# Patient Record
Sex: Female | Born: 1985 | Race: White | Hispanic: No | Smoking: Never smoker
Health system: Southern US, Community
[De-identification: ages and names within clinical notes are randomized; demographics above are authoritative.]

## PROBLEM LIST (undated history)

## (undated) DIAGNOSIS — D1800 Hemangioma unspecified site: Secondary | ICD-10-CM

## (undated) DIAGNOSIS — F419 Anxiety disorder, unspecified: Secondary | ICD-10-CM

## (undated) DIAGNOSIS — T7840XA Allergy, unspecified, initial encounter: Secondary | ICD-10-CM

## (undated) DIAGNOSIS — G43909 Migraine, unspecified, not intractable, without status migrainosus: Secondary | ICD-10-CM

## (undated) HISTORY — DX: Allergy, unspecified, initial encounter: T78.40XA

## (undated) HISTORY — DX: Anxiety disorder, unspecified: F41.9

## (undated) HISTORY — PX: BACK SURGERY: SHX140

---
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
## 2015-07-21 DIAGNOSIS — H52223 Regular astigmatism, bilateral: Secondary | ICD-10-CM | POA: Diagnosis not present

## 2015-07-21 DIAGNOSIS — H5201 Hypermetropia, right eye: Secondary | ICD-10-CM | POA: Diagnosis not present

## 2016-07-03 DIAGNOSIS — Z1389 Encounter for screening for other disorder: Secondary | ICD-10-CM | POA: Diagnosis not present

## 2016-07-03 DIAGNOSIS — Z0000 Encounter for general adult medical examination without abnormal findings: Secondary | ICD-10-CM | POA: Diagnosis not present

## 2016-07-04 DIAGNOSIS — Z6836 Body mass index (BMI) 36.0-36.9, adult: Secondary | ICD-10-CM | POA: Diagnosis not present

## 2016-07-04 DIAGNOSIS — Z01419 Encounter for gynecological examination (general) (routine) without abnormal findings: Secondary | ICD-10-CM | POA: Diagnosis not present

## 2016-07-04 DIAGNOSIS — N946 Dysmenorrhea, unspecified: Secondary | ICD-10-CM | POA: Diagnosis not present

## 2016-07-04 DIAGNOSIS — Z113 Encounter for screening for infections with a predominantly sexual mode of transmission: Secondary | ICD-10-CM | POA: Diagnosis not present

## 2016-07-04 DIAGNOSIS — Z131 Encounter for screening for diabetes mellitus: Secondary | ICD-10-CM | POA: Diagnosis not present

## 2016-07-04 MED FILL — ELINEST-28 TABLET: 0.3-30 | 28 days supply | Qty: 28 | Fill #0

## 2016-07-04 MED FILL — PHENTERMINE 37.5 MG TABLET: 37.5 | 30 days supply | Qty: 15 | Fill #0

## 2016-07-04 MED FILL — IBUPROFEN 800 MG TABLET: 800 | 10 days supply | Qty: 30 | Fill #0

## 2016-08-03 DIAGNOSIS — Z6835 Body mass index (BMI) 35.0-35.9, adult: Secondary | ICD-10-CM | POA: Diagnosis not present

## 2016-08-03 DIAGNOSIS — E669 Obesity, unspecified: Secondary | ICD-10-CM | POA: Diagnosis not present

## 2016-08-03 MED FILL — PHENTERMINE 37.5 MG TABLET: 37.5 | 30 days supply | Qty: 30 | Fill #0

## 2016-08-06 MED FILL — ELINEST-28 TABLET: 0.3-30 | 28 days supply | Qty: 28 | Fill #1

## 2016-08-28 DIAGNOSIS — Z6834 Body mass index (BMI) 34.0-34.9, adult: Secondary | ICD-10-CM | POA: Diagnosis not present

## 2016-08-28 DIAGNOSIS — E669 Obesity, unspecified: Secondary | ICD-10-CM | POA: Diagnosis not present

## 2016-09-13 MED FILL — PHENTERMINE 37.5 MG TABLET: 37.5 | 30 days supply | Qty: 30 | Fill #0

## 2017-01-28 ENCOUNTER — Ambulatory Visit (HOSPITAL_COMMUNITY)
Admission: EM | Admit: 2017-01-28 | Discharge: 2017-01-28 | Disposition: A | Discharge status: Home / Self Care | Payer: 59 | Attending: Family Medicine | Admitting: Family Medicine

## 2017-01-28 ENCOUNTER — Encounter (HOSPITAL_COMMUNITY): Payer: Self-pay | Admitting: Emergency Medicine

## 2017-01-28 DIAGNOSIS — H9202 Otalgia, left ear: Secondary | ICD-10-CM | POA: Diagnosis not present

## 2017-01-28 DIAGNOSIS — R5383 Other fatigue: Secondary | ICD-10-CM

## 2017-01-28 DIAGNOSIS — B349 Viral infection, unspecified: Secondary | ICD-10-CM | POA: Diagnosis not present

## 2017-01-28 DIAGNOSIS — J029 Acute pharyngitis, unspecified: Secondary | ICD-10-CM

## 2017-01-28 HISTORY — DX: Hemangioma unspecified site: D18.00

## 2017-01-28 LAB — POCT RAPID STREP A: STREPTOCOCCUS, GROUP A SCREEN (DIRECT): NEGATIVE

## 2017-01-28 LAB — POCT INFECTIOUS MONO SCREEN: MONO SCREEN: NEGATIVE

## 2017-01-28 MED ORDER — PREDNISONE 10 MG (21) PO TBPK: ORAL_TABLET | ORAL | 0 refills | Status: DC

## 2017-01-28 MED ORDER — MAGIC MOUTHWASH W/LIDOCAINE: 5.0000 mL | Freq: Three times a day (TID) | ORAL | 0 refills | Status: DC | PRN

## 2017-01-28 MED FILL — predniSONE 10 MG TABS: 10 | 6 days supply | Qty: 21 | Fill #0

## 2017-01-28 NOTE — Discharge Instructions (Signed)
Strep test and mono test were both negative. Your symptoms are not consistent with strep pharyngitis but are consistent with several types of viral respiratory illnesses. Your symptoms should start to resolve over the next few days, I prescribed Magic mouthwash with lidocaine for pain and discomfort, swish and swallow 3 times a day, and a short course of prednisone to reduce swelling and inflammation. If symptoms persist past one week, follow-up with your regular doctor or return to clinic as needed.

## 2017-01-28 NOTE — ED Triage Notes (Signed)
2 weeks ago woke with sore throat, used otc meds, started to feel better.  Continues to have a little pain with swallowing, lymph nodes swollen, feeling drained, lethargic.

## 2017-01-28 NOTE — ED Provider Notes (Signed)
  [facility]   [id number] 01/28/17 Arrival Time: 1014   SUBJECTIVE:  Mikayla Hicks is a 31 y.o. female who presents to the urgent care  with complaint of sore throat, swelling, and painful swallowing for 2 weeks. Symptoms started out with congestion, cough, and she had "scratchy voice" she has had fatigue, and loss of appetite as well. She's had no nausea or vomiting, does have left ear otalgia. 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   OBJECTIVE:  Vitals:   01/28/17 1106  BP: 130/70  Pulse: 97  Resp: 18  Temp: 98 F (36.7 C)  TempSrc: Oral  SpO2: 100%     General appearance: alert; no distress HEENT: normocephalic; atraumatic; conjunctivae normal; Tympanic membranes pearly grey without erythema or bulging, oropharynx without erythema or edema, tonsils +3 without exudate, no sinus tenderness, tender tonsillar lymph nodes Neck: Trachea midline, no JVD, there is cervical lymphadenopathy Lungs: clear to auscultation bilaterally Heart: regular rate and rhythm Abdomen: soft, non-tender; bowel sounds normal; no masses or organomegaly; no guarding or rebound tenderness Musculoskeletal/extremities: Pulses +2, grossly symmetrical, no dependent edema  Skin: warm and dry Neurologic: Grossly normal Psychological:  alert and cooperative; normal mood and affect     ASSESSMENT & PLAN:  1. Viral pharyngitis     Meds ordered this encounter  Medications  . cetirizine (ZYRTEC) 10 MG chewable tablet    Sig: Chew 10 mg by mouth daily.  . magic mouthwash w/lidocaine SOLN    Sig: Take 5 mLs by mouth 3 (three) times daily as needed for mouth pain.    Dispense:  100 mL    Refill:  0    1 Part Lidocaine, 1 Part Nystatin, 1 Part Maalox, 1 Part Benadryl    Order Specific Question:   Supervising Provider    Answer:   Mikayla Hicks [[id number]]  . predniSONE (STERAPRED UNI-PAK 21 TAB) 10 MG (21) TBPK tablet    Sig: Take 6  tablets tomorrow, decrease by 1 each day till finished (6,5,4,3,2,1)    Dispense:  21 tablet    Refill:  0    Order Specific Question:   Supervising Provider    Answer:   Mikayla Hicks [[id number]]    Strep and mono tests are negative. Believe most likely viral URI, given Magic mouthwash for pain, prednisone for swelling and inflammation. Follow-up with primary care or return to clinic as needed.  Reviewed expectations re: course of current medical issues. Questions answered. Outlined signs and symptoms indicating need for more acute intervention. Patient verbalized understanding. After Visit Summary given.    Procedures:     Results for orders placed or performed during the hospital encounter of 01/28/17  POCT rapid strep A [facility])  Result Value Ref Range   Streptococcus, Group A Screen (Direct) NEGATIVE NEGATIVE  Infectious mono screen, POC  Result Value Ref Range   Mono Screen NEGATIVE NEGATIVE    Labs Reviewed  POCT RAPID STREP A  POCT INFECTIOUS MONO SCREEN    No results found.  No Known Allergies  PMHx, SurgHx, SocialHx, Medications, and Allergies were reviewed in the Visit Navigator and updated as appropriate.       Mikayla Glasgow, NP 01/28/17 1225

## 2017-01-31 LAB — CULTURE, GROUP A STREP (THRC)

## 2017-02-12 MED FILL — PHENTERMINE 37.5 MG TABLET: 37.5 | 30 days supply | Qty: 30 | Fill #1

## 2017-06-19 MED FILL — AZITHROMYCIN 250 MG TABLET: 250 | 5 days supply | Qty: 6 | Fill #0

## 2017-12-08 ENCOUNTER — Ambulatory Visit (INDEPENDENT_AMBULATORY_CARE_PROVIDER_SITE_OTHER): Payer: Self-pay | Admitting: Family Medicine

## 2017-12-08 VITALS — BP 118/82 | HR 77 | Temp 98.4°F | Resp 98 | Ht 71.0 in | Wt 235.0 lb

## 2017-12-08 DIAGNOSIS — Z0000 Encounter for general adult medical examination without abnormal findings: Secondary | ICD-10-CM

## 2017-12-08 NOTE — Patient Instructions (Signed)

## 2017-12-08 NOTE — Progress Notes (Signed)
Mikayla Hicks is a 32 y.o. female who presents today with concerns of an employment physical exam. 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  Review of Systems  Constitutional: Negative for chills, fever and malaise/fatigue.  HENT: Negative for congestion, ear discharge, ear pain, sinus pain and sore throat.   Eyes: Negative.   Respiratory: Negative for cough, sputum production and shortness of breath.   Cardiovascular: Negative.  Negative for chest pain.  Gastrointestinal: Negative for abdominal pain, diarrhea, nausea and vomiting.  Genitourinary: Negative for dysuria, frequency, hematuria and urgency.  Musculoskeletal: Negative for myalgias.  Skin: Negative.   Neurological: Negative for headaches.  Endo/Heme/Allergies: Negative.   Psychiatric/Behavioral: Negative.     O: Vitals:   12/08/17 1105  BP: 118/82  Pulse: 77  Resp: (!) 98  Temp: 98.4 F (36.9 C)     Physical Exam  Constitutional: She is oriented to person, place, and time. Vital signs are normal. She appears well-developed and well-nourished. She is active.  Non-toxic appearance. She does not have a sickly appearance.  HENT:  Head: Normocephalic.  Right Ear: Hearing, tympanic membrane, external ear and ear canal normal.  Left Ear: Hearing, tympanic membrane, external ear and ear canal normal.  Nose: Nose normal.  Mouth/Throat: Uvula is midline and oropharynx is clear and moist.  Neck: Normal range of motion. Neck supple.  Cardiovascular: Normal rate, regular rhythm, normal heart sounds and normal pulses.  Pulmonary/Chest: Effort normal and breath sounds normal.  Abdominal: Soft. Bowel sounds are normal.  Musculoskeletal: Normal range of motion.  Lymphadenopathy:       Head (right side): No submental and no submandibular adenopathy present.       Head (left side): No submental and no  submandibular adenopathy present.    She has no cervical adenopathy.  Neurological: She is alert and oriented to person, place, and time.  Psychiatric: She has a normal mood and affect. Her speech is normal and behavior is normal. Cognition and memory are normal.  PHQ-9- negative  Vitals reviewed.  A: 1. Physical exam    P: Exam findings, diagnosis etiology and medication use and indications reviewed with patient. Follow- Up and discharge instructions provided. No emergent/urgent issues found on exam.  Patient verbalized understanding of information provided and agrees with plan of care (POC), all questions answered.  1. Physical exam  WNL

## 2018-02-12 MED FILL — METHYLPREDNISOLONE 4 MG TAB: 4 | 6 days supply | Qty: 21 | Fill #0

## 2018-02-26 ENCOUNTER — Encounter: Payer: Self-pay | Admitting: Family Medicine

## 2018-02-26 ENCOUNTER — Ambulatory Visit (INDEPENDENT_AMBULATORY_CARE_PROVIDER_SITE_OTHER): Payer: Self-pay | Admitting: Family Medicine

## 2018-02-26 VITALS — BP 110/82 | HR 76 | Temp 98.3°F | Wt 235.4 lb

## 2018-02-26 DIAGNOSIS — L309 Dermatitis, unspecified: Secondary | ICD-10-CM

## 2018-02-26 MED ORDER — ERYTHROMYCIN 5 MG/GM OP OINT: 1.0000 "application " | TOPICAL_OINTMENT | Freq: Two times a day (BID) | OPHTHALMIC | 0 refills | Status: AC

## 2018-02-26 MED ORDER — PREDNISONE 20 MG PO TABS: 40.0000 mg | ORAL_TABLET | Freq: Every day | ORAL | 0 refills | Status: AC

## 2018-02-26 MED FILL — ERYTHROMYCIN EYE OINTMENT: 5 | 5 days supply | Qty: 4 | Fill #0

## 2018-02-26 MED FILL — predniSONE 20 MG TABS: 20 | 5 days supply | Qty: 10 | Fill #0

## 2018-02-26 NOTE — Progress Notes (Signed)
Mikayla Hicks is a 32 y.o. female who presents today with concerns of left eye skin irritation. She reports that this condition has been present over the last month. She has had treatment of this condition with topical and oral steroids with minimal relief of symptoms. She denies any visual symptoms like blurred vision or decreased acuity. She does report mild pain and itching limited to the skin on the upper and lower lid. She confirms some historical report of seasonal allergies.  Review of Systems  Constitutional: Negative for chills, fever and malaise/fatigue.  HENT: Negative for congestion, ear discharge, ear pain, sinus pain and sore throat.   Eyes: Positive for pain and redness.       Dry skin, itching and redness on left eye x 1 month +  Respiratory: Negative for cough, sputum production and shortness of breath.   Cardiovascular: Negative.  Negative for chest pain.  Gastrointestinal: Negative for abdominal pain, diarrhea, nausea and vomiting.  Genitourinary: Negative for dysuria, frequency, hematuria and urgency.  Musculoskeletal: Negative for myalgias.  Skin: Negative.   Neurological: Negative for headaches.  Endo/Heme/Allergies: Negative.   Psychiatric/Behavioral: Negative.     O: Vitals:   02/26/18 0817  BP: 110/82  Pulse: 76  Temp: 98.3 F (36.8 C)  SpO2: 99%     Physical Exam  Constitutional: She is oriented to person, place, and time. Vital signs are normal. She appears well-developed and well-nourished. She is active.  Non-toxic appearance. She does not have a sickly appearance.  HENT:  Head: Normocephalic.  Right Ear: Hearing, tympanic membrane, external ear and ear canal normal.  Left Ear: Hearing, tympanic membrane, external ear and ear canal normal.  Nose: Nose normal.  Mouth/Throat: Uvula is midline and oropharynx is clear and moist.  Eyes: Pupils are equal, round, and reactive to light. EOM are normal. Right eye exhibits no chemosis, no discharge, no  exudate and no hordeolum. No foreign body present in the right eye. Left eye exhibits no chemosis, no discharge, no exudate and no hordeolum. No foreign body present in the left eye. Right conjunctiva is not injected. Right conjunctiva has no hemorrhage. Left conjunctiva is not injected. Left conjunctiva has no hemorrhage. No scleral icterus.    Denies pain to globe area when palpated mild skin erythema and dermatitis like skin flaking noted - no evidence of edema or effusion-   Neck: Normal range of motion. Neck supple.  Cardiovascular: Normal rate, regular rhythm, normal heart sounds and normal pulses.  Pulmonary/Chest: Effort normal and breath sounds normal.  Abdominal: Soft. Bowel sounds are normal.  Musculoskeletal: Normal range of motion.  Lymphadenopathy:       Head (right side): No submental and no submandibular adenopathy present.       Head (left side): No submental and no submandibular adenopathy present.    She has no cervical adenopathy.  Neurological: She is alert and oriented to person, place, and time.  Psychiatric: She has a normal mood and affect.  Vitals reviewed.  A: 1. Dermatitis    P: Discussed exam findings, diagnosis etiology and medication use and indications reviewed with patient. Follow- Up and discharge instructions provided. No emergent/urgent issues found on exam.  Patient verbalized understanding of information provided and agrees with plan of care (POC), all questions answered.  1. Dermatitis - predniSONE (DELTASONE) 20 MG tablet; Take 2 tablets (40 mg total) by mouth daily with breakfast for 5 days. - erythromycin ophthalmic ointment; Place 1 application into the left eye 2 (two) times  daily for 5 days.  Advised if affected area unimproved to seek care with PCP for evaluation and potential referral.

## 2018-02-26 NOTE — Patient Instructions (Signed)

## 2018-12-10 ENCOUNTER — Ambulatory Visit (INDEPENDENT_AMBULATORY_CARE_PROVIDER_SITE_OTHER): Payer: Self-pay | Admitting: Nurse Practitioner

## 2018-12-10 ENCOUNTER — Other Ambulatory Visit: Payer: Self-pay

## 2018-12-10 VITALS — BP 110/70 | HR 80 | Temp 98.2°F | Resp 16 | Ht 70.5 in | Wt 233.2 lb

## 2018-12-10 DIAGNOSIS — K649 Unspecified hemorrhoids: Secondary | ICD-10-CM

## 2018-12-10 DIAGNOSIS — Z0289 Encounter for other administrative examinations: Secondary | ICD-10-CM

## 2018-12-10 MED ORDER — HYDROCORTISONE ACETATE 25 MG RE SUPP: 25.0000 mg | Freq: Two times a day (BID) | RECTAL | 0 refills | Status: AC | PRN

## 2018-12-10 MED FILL — HYDROCORTISONE ACETATE 25 M: 25 | 12 days supply | Qty: 24 | Fill #0

## 2018-12-10 NOTE — Patient Instructions (Addendum)
Health Maintenance, Female Adopting a healthy lifestyle and getting preventive care are important in promoting health and wellness. Ask your health care provider about:  The right schedule for you to have regular tests and exams.  Things you can do on your own to prevent diseases and keep yourself healthy. What should I know about diet, weight, and exercise? Eat a healthy diet   Eat a diet that includes plenty of vegetables, fruits, low-fat dairy products, and lean protein.  Do not eat a lot of foods that are high in solid fats, added sugars, or sodium. Maintain a healthy weight Body mass index (BMI) is used to identify weight problems. It estimates body fat based on height and weight. Your health care provider can help determine your BMI and help you achieve or maintain a healthy weight. Get regular exercise Get regular exercise. This is one of the most important things you can do for your health. Most adults should:  Exercise for at least 150 minutes each week. The exercise should increase your heart rate and make you sweat (moderate-intensity exercise).  Do strengthening exercises at least twice a week. This is in addition to the moderate-intensity exercise.  Spend less time sitting. Even light physical activity can be beneficial. Watch cholesterol and blood lipids Have your blood tested for lipids and cholesterol at 31 years of age, then have this test every 5 years. Have your cholesterol levels checked more often if:  Your lipid or cholesterol levels are high.  You are older than 31 years of age.  You are at high risk for heart disease. What should I know about cancer screening? Depending on your health history and family history, you may need to have cancer screening at various ages. This may include screening for:  Breast cancer.  Cervical cancer.  Colorectal cancer.  Skin cancer.  Lung cancer. What should I know about heart disease, diabetes, and high blood  pressure? Blood pressure and heart disease  High blood pressure causes heart disease and increases the risk of stroke. This is more likely to develop in people who have high blood pressure readings, are of African descent, or are overweight.  Have your blood pressure checked: ? Every 3-5 years if you are 68-56 years of age. ? Every year if you are 44 years old or older. Diabetes Have regular diabetes screenings. This checks your fasting blood sugar level. Have the screening done:  Once every three years after age 35 if you are at a normal weight and have a low risk for diabetes.  More often and at a younger age if you are overweight or have a high risk for diabetes. What should I know about preventing infection? Hepatitis B If you have a higher risk for hepatitis B, you should be screened for this virus. Talk with your health care provider to find out if you are at risk for hepatitis B infection. Hepatitis C Testing is recommended for:  Everyone born from [birth year] through 1963.  Anyone with known risk factors for hepatitis C. Sexually transmitted infections (STIs)  Get screened for STIs, including gonorrhea and chlamydia, if: ? You are sexually active and are younger than 31 years of age. ? You are older than 31 years of age and your health care provider tells you that you are at risk for this type of infection. ? Your sexual activity has changed since you were last screened, and you are at increased risk for chlamydia or gonorrhea. Ask your health care provider if  you are at risk.  Ask your health care provider about whether you are at high risk for HIV. Your health care provider may recommend a prescription medicine to help prevent HIV infection. If you choose to take medicine to prevent HIV, you should first get tested for HIV. You should then be tested every 3 months for as long as you are taking the medicine. Pregnancy  If you are about to stop having your period (premenopausal) and  you may become pregnant, seek counseling before you get pregnant.  Take 400 to 800 micrograms (mcg) of folic acid every day if you become pregnant.  Ask for birth control (contraception) if you want to prevent pregnancy. Osteoporosis and menopause Osteoporosis is a disease in which the bones lose minerals and strength with aging. This can result in bone fractures. If you are 14 years old or older, or if you are at risk for osteoporosis and fractures, ask your health care provider if you should:  Be screened for bone loss.  Take a calcium or vitamin D supplement to lower your risk of fractures.  Be given hormone replacement therapy (HRT) to treat symptoms of menopause. Follow these instructions at home: Lifestyle  Do not use any products that contain nicotine or tobacco, such as cigarettes, e-cigarettes, and chewing tobacco. If you need help quitting, ask your health care provider.  Do not use street drugs.  Do not share needles.  Ask your health care provider for help if you need support or information about quitting drugs. Alcohol use  Do not drink alcohol if: ? Your health care provider tells you not to drink. ? You are pregnant, may be pregnant, or are planning to become pregnant.  If you drink alcohol: ? Limit how much you use to 0-1 drink a day. ? Limit intake if you are breastfeeding.  Be aware of how much alcohol is in your drink. In the [place] one drink equals one 12 oz bottle of beer (355 mL), one 5 oz glass of wine (148 mL), or one 1 oz glass of hard liquor (44 mL). General instructions  Schedule regular health, dental, and eye exams.  Stay current with your vaccines.  Tell your health care provider if: ? You often feel depressed. ? You have ever been abused or do not feel safe at home. Summary  Adopting a healthy lifestyle and getting preventive care are important in promoting health and wellness.  Follow your health care provider's instructions about healthy  diet, exercising, and getting tested or screened for diseases.  Follow your health care provider's instructions on monitoring your cholesterol and blood pressure. This information is not intended to replace advice given to you by your health care provider. Make sure you discuss any questions you have with your health care provider. Document Released: 12/11/2010 Document Revised: 05/21/2018 Document Reviewed: 05/21/2018 Elsevier Patient Education  2020 [employer] 39-36 Years Old, Female Preventive care refers to visits with your health care provider and lifestyle choices that can promote health and wellness. This includes:  A yearly physical exam. This may also be called an annual well check.  Regular dental visits and eye exams.  Immunizations.  Screening for certain conditions.  Healthy lifestyle choices, such as eating a healthy diet, getting regular exercise, not using drugs or products that contain nicotine and tobacco, and limiting alcohol use. What can I expect for my preventive care visit? Physical exam Your health care provider will check your:  Height and weight. This  may be used to calculate body mass index (BMI), which tells if you are at a healthy weight.  Heart rate and blood pressure.  Skin for abnormal spots. Counseling Your health care provider may ask you questions about your:  Alcohol, tobacco, and drug use.  Emotional well-being.  Home and relationship well-being.  Sexual activity.  Eating habits.  Work and work Statistician.  Method of birth control.  Menstrual cycle.  Pregnancy history. What immunizations do I need?  Influenza (flu) vaccine  This is recommended every year. Tetanus, diphtheria, and pertussis (Tdap) vaccine  You may need a Td booster every 10 years. Varicella (chickenpox) vaccine  You may need this if you have not been vaccinated. Human papillomavirus (HPV) vaccine  If recommended by your health care  provider, you may need three doses over 6 months. Measles, mumps, and rubella (MMR) vaccine  You may need at least one dose of MMR. You may also need a second dose. Meningococcal conjugate (MenACWY) vaccine  One dose is recommended if you are age 3-21 years and a first-year college student living in a residence hall, or if you have one of several medical conditions. You may also need additional booster doses. Pneumococcal conjugate (PCV13) vaccine  You may need this if you have certain conditions and were not previously vaccinated. Pneumococcal polysaccharide (PPSV23) vaccine  You may need one or two doses if you smoke cigarettes or if you have certain conditions. Hepatitis A vaccine  You may need this if you have certain conditions or if you travel or work in places where you may be exposed to hepatitis A. Hepatitis B vaccine  You may need this if you have certain conditions or if you travel or work in places where you may be exposed to hepatitis B. Haemophilus influenzae type b (Hib) vaccine  You may need this if you have certain conditions. You may receive vaccines as individual doses or as more than one vaccine together in one shot (combination vaccines). Talk with your health care provider about the risks and benefits of combination vaccines. What tests do I need?  Blood tests  Lipid and cholesterol levels. These may be checked every 5 years starting at age 64.  Hepatitis C test.  Hepatitis B test. Screening  Diabetes screening. This is done by checking your blood sugar (glucose) after you have not eaten for a while (fasting).  Sexually transmitted disease (STD) testing.  BRCA-related cancer screening. This may be done if you have a family history of breast, ovarian, tubal, or peritoneal cancers.  Pelvic exam and Pap test. This may be done every 3 years starting at age 64. Starting at age 53, this may be done every 5 years if you have a Pap test in combination with an  HPV test. Talk with your health care provider about your test results, treatment options, and if necessary, the need for more tests. Follow these instructions at home: Eating and drinking   Eat a diet that includes fresh fruits and vegetables, whole grains, lean protein, and low-fat dairy.  Take vitamin and mineral supplements as recommended by your health care provider.  Do not drink alcohol if: ? Your health care provider tells you not to drink. ? You are pregnant, may be pregnant, or are planning to become pregnant.  If you drink alcohol: ? Limit how much you have to 0-1 drink a day. ? Be aware of how much alcohol is in your drink. In the [place] one drink equals one 12 oz  bottle of beer (355 mL), one 5 oz glass of wine (148 mL), or one 1 oz glass of hard liquor (44 mL). Lifestyle  Take daily care of your teeth and gums.  Stay active. Exercise for at least 30 minutes on 5 or more days each week.  Do not use any products that contain nicotine or tobacco, such as cigarettes, e-cigarettes, and chewing tobacco. If you need help quitting, ask your health care provider.  If you are sexually active, practice safe sex. Use a condom or other form of birth control (contraception) in order to prevent pregnancy and STIs (sexually transmitted infections). If you plan to become pregnant, see your health care provider for a preconception visit. What's next?  Visit your health care provider once a year for a well check visit.  Ask your health care provider how often you should have your eyes and teeth checked.  Stay up to date on all vaccines. This information is not intended to replace advice given to you by your health care provider. Make sure you discuss any questions you have with your health care provider. Document Released: 07/24/2001 Document Revised: 02/06/2018 Document Reviewed: 02/06/2018 Elsevier Patient Education  2020 [employer] Following a healthy eating  pattern may help you to achieve and maintain a healthy body weight, reduce the risk of chronic disease, and live a long and productive life. It is important to follow a healthy eating pattern at an appropriate calorie level for your body. Your nutritional needs should be met primarily through food by choosing a variety of nutrient-rich foods. What are tips for following this plan? Reading food labels  Read labels and choose the following: ? Reduced or low sodium. ? Juices with 100% fruit juice. ? Foods with low saturated fats and high polyunsaturated and monounsaturated fats. ? Foods with whole grains, such as whole wheat, cracked wheat, brown rice, and wild rice. ? Whole grains that are fortified with folic acid. This is recommended for women who are pregnant or who want to become pregnant.  Read labels and avoid the following: ? Foods with a lot of added sugars. These include foods that contain brown sugar, corn sweetener, corn syrup, dextrose, fructose, glucose, high-fructose corn syrup, honey, invert sugar, lactose, malt syrup, maltose, molasses, raw sugar, sucrose, trehalose, or turbinado sugar.  Do not eat more than the following amounts of added sugar per day:  6 teaspoons (25 g) for women.  9 teaspoons (38 g) for men. ? Foods that contain processed or refined starches and grains. ? Refined grain products, such as white flour, degermed cornmeal, white bread, and white rice. Shopping  Choose nutrient-rich snacks, such as vegetables, whole fruits, and nuts. Avoid high-calorie and high-sugar snacks, such as potato chips, fruit snacks, and candy.  Use oil-based dressings and spreads on foods instead of solid fats such as butter, stick margarine, or cream cheese.  Limit pre-made sauces, mixes, and "instant" products such as flavored rice, instant noodles, and ready-made pasta.  Try more plant-protein sources, such as tofu, tempeh, black beans, edamame, lentils, nuts, and  seeds.  Explore eating plans such as the Mediterranean diet or vegetarian diet. Cooking  Use oil to saut or stir-fry foods instead of solid fats such as butter, stick margarine, or lard.  Try baking, boiling, grilling, or broiling instead of frying.  Remove the fatty part of meats before cooking.  Steam vegetables in water or broth. Meal planning   At meals, imagine dividing your plate into fourths: ?  One-half of your plate is fruits and vegetables. ? One-fourth of your plate is whole grains. ? One-fourth of your plate is protein, especially lean meats, poultry, eggs, tofu, beans, or nuts.  Include low-fat dairy as part of your daily diet. Lifestyle  Choose healthy options in all settings, including home, work, school, restaurants, or stores.  Prepare your food safely: ? Wash your hands after handling raw meats. ? Keep food preparation surfaces clean by regularly washing with hot, soapy water. ? Keep raw meats separate from ready-to-eat foods, such as fruits and vegetables. ? Cook seafood, meat, poultry, and eggs to the recommended internal temperature. ? Store foods at safe temperatures. In general:  Keep cold foods at 71F (4.4C) or below.  Keep hot foods at 171F (60C) or above.  Keep your freezer at [facility] (-17.8C) or below.  Foods are no longer safe to eat when they have been between the temperatures of 40-171F (4.4-60C) for more than 2 hours. What foods should I eat? Fruits Aim to eat 2 cup-equivalents of fresh, canned (in natural juice), or frozen fruits each day. Examples of 1 cup-equivalent of fruit include 1 small apple, 8 large strawberries, 1 cup canned fruit,  cup dried fruit, or 1 cup 100% juice. Vegetables Aim to eat 2-3 cup-equivalents of fresh and frozen vegetables each day, including different varieties and colors. Examples of 1 cup-equivalent of vegetables include 2 medium carrots, 2 cups raw, leafy greens, 1 cup chopped vegetable (raw or cooked),  or 1 medium baked potato. Grains Aim to eat 6 ounce-equivalents of whole grains each day. Examples of 1 ounce-equivalent of grains include 1 slice of bread, 1 cup ready-to-eat cereal, 3 cups popcorn, or  cup cooked rice, pasta, or cereal. Meats and other proteins Aim to eat 5-6 ounce-equivalents of protein each day. Examples of 1 ounce-equivalent of protein include 1 egg, 1/2 cup nuts or seeds, or 1 tablespoon (16 g) peanut butter. A cut of meat or fish that is the size of a deck of cards is about 3-4 ounce-equivalents.  Of the protein you eat each week, try to have at least 8 ounces come from seafood. This includes salmon, trout, herring, and anchovies. Dairy Aim to eat 3 cup-equivalents of fat-free or low-fat dairy each day. Examples of 1 cup-equivalent of dairy include 1 cup (240 mL) milk, 8 ounces (250 g) yogurt, 1 ounces (44 g) natural cheese, or 1 cup (240 mL) fortified soy milk. Fats and oils  Aim for about 5 teaspoons (21 g) per day. Choose monounsaturated fats, such as canola and olive oils, avocados, peanut butter, and most nuts, or polyunsaturated fats, such as sunflower, corn, and soybean oils, walnuts, pine nuts, sesame seeds, sunflower seeds, and flaxseed. Beverages  Aim for six 8-oz glasses of water per day. Limit coffee to three to five 8-oz cups per day.  Limit caffeinated beverages that have added calories, such as soda and energy drinks.  Limit alcohol intake to no more than 1 drink a day for nonpregnant women and 2 drinks a day for men. One drink equals 12 oz of beer (355 mL), 5 oz of wine (148 mL), or 1 oz of hard liquor (44 mL). Seasoning and other foods  Avoid adding excess amounts of salt to your foods. Try flavoring foods with herbs and spices instead of salt.  Avoid adding sugar to foods.  Try using oil-based dressings, sauces, and spreads instead of solid fats. This information is based on general [place] nutrition guidelines. For more information,  visit  Build[employer].es. Exact amounts may vary based on your nutrition needs. Summary  A healthy eating plan may help you to maintain a healthy weight, reduce the risk of chronic diseases, and stay active throughout your life.  Plan your meals. Make sure you eat the right portions of a variety of nutrient-rich foods.  Try baking, boiling, grilling, or broiling instead of frying.  Choose healthy options in all settings, including home, work, school, restaurants, or stores. This information is not intended to replace advice given to you by your health care provider. Make sure you discuss any questions you have with your health care provider. Document Released: 09/09/2017 Document Revised: 09/09/2017 Document Reviewed: 09/09/2017 Elsevier Patient Education  2020 [employer].  Exercising to Lose Weight Exercise is structured, repetitive physical activity to improve fitness and health. Getting regular exercise is important for everyone. It is especially important if you are overweight. Being overweight increases your risk of heart disease, stroke, diabetes, high blood pressure, and several types of cancer. Reducing your calorie intake and exercising can help you lose weight. Exercise is usually categorized as moderate or vigorous intensity. To lose weight, most people need to do a certain amount of moderate-intensity or vigorous-intensity exercise each week. Moderate-intensity exercise  Moderate-intensity exercise is any activity that gets you moving enough to burn at least three times more energy (calories) than if you were sitting. Examples of moderate exercise include:  Walking a mile in 15 minutes.  Doing light yard work.  Biking at an easy pace. Most people should get at least 150 minutes (2 hours and 30 minutes) a week of moderate-intensity exercise to maintain their body weight. Vigorous-intensity exercise Vigorous-intensity exercise is any activity that gets you moving enough to burn at  least six times more calories than if you were sitting. When you exercise at this intensity, you should be working hard enough that you are not able to carry on a conversation. Examples of vigorous exercise include:  Running.  Playing a team sport, such as football, basketball, and soccer.  Jumping rope. Most people should get at least 75 minutes (1 hour and 15 minutes) a week of vigorous-intensity exercise to maintain their body weight. How can exercise affect me? When you exercise enough to burn more calories than you eat, you lose weight. Exercise also reduces body fat and builds muscle. The more muscle you have, the more calories you burn. Exercise also:  Improves mood.  Reduces stress and tension.  Improves your overall fitness, flexibility, and endurance.  Increases bone strength. The amount of exercise you need to lose weight depends on:  Your age.  The type of exercise.  Any health conditions you have.  Your overall physical ability. Talk to your health care provider about how much exercise you need and what types of activities are safe for you. What actions can I take to lose weight? Nutrition   Make changes to your diet as told by your health care provider or diet and nutrition specialist (dietitian). This may include: ? Eating fewer calories. ? Eating more protein. ? Eating less unhealthy fats. ? Eating a diet that includes fresh fruits and vegetables, whole grains, low-fat dairy products, and lean protein. ? Avoiding foods with added fat, salt, and sugar.  Drink plenty of water while you exercise to prevent dehydration or heat stroke. Activity  Choose an activity that you enjoy and set realistic goals. Your health care provider can help you make an exercise plan that works for you.  Exercise at a moderate or vigorous intensity most days of the week. ? The intensity of exercise may vary from person to person. You can tell how intense a workout is for you by  paying attention to your breathing and heartbeat. Most people will notice their breathing and heartbeat get faster with more intense exercise.  Do resistance training twice each week, such as: ? Push-ups. ? Sit-ups. ? Lifting weights. ? Using resistance bands.  Getting short amounts of exercise can be just as helpful as long structured periods of exercise. If you have trouble finding time to exercise, try to include exercise in your daily routine. ? Get up, stretch, and walk around every 30 minutes throughout the day. ? Go for a walk during your lunch break. ? Park your car farther away from your destination. ? If you take public transportation, get off one stop early and walk the rest of the way. ? Make phone calls while standing up and walking around. ? Take the stairs instead of elevators or escalators.  Wear comfortable clothes and shoes with good support.  Do not exercise so much that you hurt yourself, feel dizzy, or get very short of breath. Where to find more information  [place] Department of Health and Human Services: [employer]  Centers for Disease Control and Prevention (CDC): http://www.wolf.info/ Contact a health care provider:  Before starting a new exercise program.  If you have questions or concerns about your weight.  If you have a medical problem that keeps you from exercising. Get help right away if you have any of the following while exercising:  Injury.  Dizziness.  Difficulty breathing or shortness of breath that does not go away when you stop exercising.  Chest pain.  Rapid heartbeat. Summary  Being overweight increases your risk of heart disease, stroke, diabetes, high blood pressure, and several types of cancer.  Losing weight happens when you burn more calories than you eat.  Reducing the amount of calories you eat in addition to getting regular moderate or vigorous exercise each week helps you lose weight. This information is not intended to replace  advice given to you by your health care provider. Make sure you discuss any questions you have with your health care provider. Document Released: 06/30/2010 Document Revised: 06/10/2017 Document Reviewed: 06/10/2017 Elsevier Patient Education  2020 [employer].  Hemorrhoids Hemorrhoids are swollen veins in and around the rectum or anus. There are two types of hemorrhoids:  Internal hemorrhoids. These occur in the veins that are just inside the rectum. They may poke through to the outside and become irritated and painful.  External hemorrhoids. These occur in the veins that are outside the anus and can be felt as a painful swelling or hard lump near the anus. Most hemorrhoids do not cause serious problems, and they can be managed with home treatments such as diet and lifestyle changes. If home treatments do not help the symptoms, procedures can be done to shrink or remove the hemorrhoids. What are the causes? This condition is caused by increased pressure in the anal area. This pressure may result from various things, including:  Constipation.  Straining to have a bowel movement.  Diarrhea.  Pregnancy.  Obesity.  Sitting for long periods of time.  Heavy lifting or other activity that causes you to strain.  Anal sex.  Riding a bike for a long period of time. What are the signs or symptoms? Symptoms of this condition include:  Pain.  Anal itching or irritation.  Rectal bleeding.  Leakage of stool (feces).  Anal swelling.  One or more lumps around the anus. How is this diagnosed? This condition can often be diagnosed through a visual exam. Other exams or tests may also be done, such as:  An exam that involves feeling the rectal area with a gloved hand (digital rectal exam).  An exam of the anal canal that is done using a small tube (anoscope).  A blood test, if you have lost a significant amount of blood.  A test to look inside the colon using a flexible tube with  a camera on the end (sigmoidoscopy or colonoscopy). How is this treated? This condition can usually be treated at home. However, various procedures may be done if dietary changes, lifestyle changes, and other home treatments do not help your symptoms. These procedures can help make the hemorrhoids smaller or remove them completely. Some of these procedures involve surgery, and others do not. Common procedures include:  Rubber band ligation. Rubber bands are placed at the base of the hemorrhoids to cut off their blood supply.  Sclerotherapy. Medicine is injected into the hemorrhoids to shrink them.  Infrared coagulation. A type of light energy is used to get rid of the hemorrhoids.  Hemorrhoidectomy surgery. The hemorrhoids are surgically removed, and the veins that supply them are tied off.  Stapled hemorrhoidopexy surgery. The surgeon staples the base of the hemorrhoid to the rectal wall. Follow these instructions at home: Eating and drinking   Eat foods that have a lot of fiber in them, such as whole grains, beans, nuts, fruits, and vegetables.  Ask your health care provider about taking products that have added fiber (fiber supplements).  Reduce the amount of fat in your diet. You can do this by eating low-fat dairy products, eating less red meat, and avoiding processed foods.  Drink enough fluid to keep your urine pale yellow. Managing pain and swelling   Take warm sitz baths for 20 minutes, 3-4 times a day to ease pain and discomfort. You may do this in a bathtub or using a portable sitz bath that fits over the toilet.  If directed, apply ice to the affected area. Using ice packs between sitz baths may be helpful. ? Put ice in a plastic bag. ? Place a towel between your skin and the bag. ? Leave the ice on for 20 minutes, 2-3 times a day. General instructions  Take over-the-counter and prescription medicines only as told by your health care provider.  Use medicated creams or  suppositories as told.  Get regular exercise. Ask your health care provider how much and what kind of exercise is best for you. In general, you should do moderate exercise for at least 30 minutes on most days of the week (150 minutes each week). This can include activities such as walking, biking, or yoga.  Go to the bathroom when you have the urge to have a bowel movement. Do not wait.  Avoid straining to have bowel movements.  Keep the anal area dry and clean. Use wet toilet paper or moist towelettes after a bowel movement.  Do not sit on the toilet for long periods of time. This increases blood pooling and pain.  Keep all follow-up visits as told by your health care provider. This is important. Contact a health care provider if you have:  Increasing pain and swelling that are not controlled by treatment or medicine.  Difficulty having a bowel movement, or you are unable to have a  bowel movement.  Pain or inflammation outside the area of the hemorrhoids. Get help right away if you have:  Uncontrolled bleeding from your rectum. Summary  Hemorrhoids are swollen veins in and around the rectum or anus.  Most hemorrhoids can be managed with home treatments such as diet and lifestyle changes.  Taking warm sitz baths can help ease pain and discomfort.  In severe cases, procedures or surgery can be done to shrink or remove the hemorrhoids. This information is not intended to replace advice given to you by your health care provider. Make sure you discuss any questions you have with your health care provider. Document Released: 05/25/2000 Document Revised: 06/05/2018 Document Reviewed: 10/17/2017 Elsevier Patient Education  2020 [employer].

## 2018-12-10 NOTE — Progress Notes (Addendum)
Subjective:  Mikayla Hicks is a 33 y.o. female who presents for a physical examination as required by [facility]. The patient does not have a PCP at this time.  The patient also has some complaints of hemorrhoids that have been longstanding, stating that she has been using over-the-counter medications without relief.  Patient states that she does have some constipation as a result of the hemorrhoids, last BM was 3 days ago.  Patient also has a history of hemangioma at T-5/T-6.  Patient states she is currently looking for a new neurosurgeon.  Patient states she had surgery on her back in 2002. Patient has no other complaints today.  The patient denies fever, chills, cough, congestion, sinus pain, sinus pressure, headache, abdominal pain, nausea, vomiting, or other concerns.  The patient denies any other past medical history other than seasonal allergies.  Patient informed she currently takes generic Allegra.  Patient denies any allergies to any foods or medications at this time.  The patient informs her last menstrual cycle was approximately 1 week ago.  Patient states her cycles range from about 5 to 6 days.  Patient states her cycles are regular at this time.  The patient has a past surgical history of back surgery in 2002.  Patient denies any recent hospitalizations or other surgeries.  The patient lives alone.  The patient does not have children.  The patient's parents are alive.  The patient's mother is 71, and has no current health problems.  The patient's father is 51, and currently does not have any health problems.  The patient does have an older sister who is healthy.  The patient admits to drinking alcohol occasionally.  The patient denies use of recreational drugs and does not smoke.  The patient informs that she does exercise about 3 days a week, and has a regular diet, which she is trying to improve on to help lose weight.   Past Medical History:  Diagnosis Date  . Hemangioma     Past  Surgical History:  Procedure Laterality Date  . BACK SURGERY      Social History   Tobacco Use  . Smoking status: Never Smoker  . Smokeless tobacco: Never Used  Substance Use Topics  . Alcohol use: Yes  . Drug use: No    No Known Allergies  Current Outpatient Medications  Medication Sig Dispense Refill  . cetirizine (ZYRTEC) 10 MG chewable tablet Chew 10 mg by mouth daily.    . Omega-3 Fatty Acids (FISH OIL) 1000 MG CAPS Take by mouth.    . magic mouthwash w/lidocaine SOLN Take 5 mLs by mouth 3 (three) times daily as needed for mouth pain. (Patient not taking: Reported on 12/08/2017) 100 mL 0  . predniSONE (STERAPRED UNI-PAK 21 TAB) 10 MG (21) TBPK tablet Take 6 tablets tomorrow, decrease by 1 each day till finished (6,5,4,3,2,1) (Patient not taking: Reported on 12/08/2017) 21 tablet 0   No current facility-administered medications for this visit.     Review of Systems  Constitutional: Negative.   HENT: Negative.   Eyes: Negative.   Respiratory: Negative.   Cardiovascular: Negative.   Gastrointestinal:       Rectal itching, history of hemorrhoids  Genitourinary: Negative.   Musculoskeletal: Negative.   Skin: Negative.   Neurological: Negative.   Endo/Heme/Allergies: Positive for environmental allergies.     Objective: Blood pressure 110/70, pulse 80, temperature 98.2 F (36.8 C), temperature source Oral, resp. rate 16, height 5' 10.5" (1.791 m), weight 233 lb  3.2 oz (105.8 kg), SpO2 99 %.  BP 110/70 (BP Location: Right Arm, Patient Position: Sitting, Cuff Size: Normal)   Pulse 80   Temp 98.2 F (36.8 C) (Oral)   Resp 16   Ht 5' 10.5" (1.791 m)   Wt 233 lb 3.2 oz (105.8 kg)   SpO2 99%   BMI 32.99 kg/m   General Appearance:  Alert, cooperative, no distress, appears stated age  Head:  Normocephalic, without obvious abnormality, atraumatic  Eyes:  PERRL, conjunctiva/corneas clear, EOM's intact, fundi benign, both eyes  Ears:  Normal TM's and external ear canals,  both ears  Nose: Nares normal, septum midline,mucosa normal, no drainage or sinus tenderness  Throat: Lips, mucosa, and tongue normal; teeth and gums normal  Neck: Supple, symmetrical, trachea midline, no adenopathy;  thyroid: not enlarged, symmetric, no tenderness/mass/nodules; no carotid bruit or JVD  Back:   Symmetric, no curvature, ROM normal, no CVA tenderness  Lungs:   Clear to auscultation bilaterally, respirations unlabored  Heart:  Regular rate and rhythm, S1 and S2 normal, no murmur, rub, or gallop  Abdomen:   Soft, non-tender, bowel sounds active all four quadrants,  no masses, no organomegaly  Pelvic: Deferred  Extremities: Extremities normal, atraumatic, no cyanosis or edema  Pulses: 2+ and symmetric  Skin: Skin color, texture, turgor normal, no rashes or lesions  Lymph nodes: Cervical, supraclavicular, and axillary nodes normal  Neurologic: Normal      Assessment:  Work physical Exam     Plan:  Exam findings, diagnosis etiology and medication use and indications reviewed with patient. Follow- Up and discharge instructions provided. No emergent/urgent issues found on exam.  Discussed with patient that this physical exam was for the purposes of her work requirements only and was in no way a substitution of a needed complete physical exam to include screenings, labwork, etc. I have also provided the patient with a list of primary care offices to contact to establish primary care for a complete physical.  Also suggested to the patient to go ahead and seek out a neurosurgeon for her follow-up or have the PCP make a referral once she establishes care.  In the interim, I will go ahead and prescribe the patient a suppository for her hemorrhoids to help with her constipation in addition to her diet, increasing fluids, and staying active.  Patient education was provided. Patient verbalized understanding of information provided and agrees with plan of care (POC), all questions answered. The  patient is advised to call or return to clinic if condition does not see an improvement in symptoms, or to seek the care of the closest emergency department if condition worsens with the above plan.  1. Hemorrhoids, unspecified hemorrhoid type  - hydrocortisone (ANUSOL-HC) 25 MG suppository; Place 1 suppository (25 mg total) rectally 2 (two) times daily as needed for up to 12 days for hemorrhoids.  Dispense: 24 suppository; Refill: 0 -Sitz baths as needed to help with rectal itching and discomfort. -Increase fluids, increase fiber, monitor diet to help with bowel movements.  2. Physical examination of employee  -Follow up with a PCP office from the list provided to establish primary care and to have a complete physical. -Seek care of neurosurgeon for hemangioma follow-up.

## 2019-03-08 ENCOUNTER — Ambulatory Visit (INDEPENDENT_AMBULATORY_CARE_PROVIDER_SITE_OTHER)
Admission: RE | Admit: 2019-03-08 | Discharge: 2019-03-08 | Disposition: A | Discharge status: Home / Self Care | Payer: 59 | Source: Ambulatory Visit

## 2019-03-08 DIAGNOSIS — K649 Unspecified hemorrhoids: Secondary | ICD-10-CM | POA: Diagnosis not present

## 2019-03-08 DIAGNOSIS — M545 Low back pain, unspecified: Secondary | ICD-10-CM

## 2019-03-08 MED ORDER — HYDROCORTISONE ACETATE 25 MG RE SUPP: 25.0000 mg | Freq: Two times a day (BID) | RECTAL | 0 refills | Status: DC

## 2019-03-08 MED ORDER — CYCLOBENZAPRINE HCL 10 MG PO TABS: 10.0000 mg | ORAL_TABLET | Freq: Two times a day (BID) | ORAL | 0 refills | Status: DC | PRN

## 2019-03-08 MED ORDER — NAPROXEN 375 MG PO TABS: 375.0000 mg | ORAL_TABLET | Freq: Two times a day (BID) | ORAL | 0 refills | Status: DC

## 2019-03-08 NOTE — ED Provider Notes (Signed)
Virtual Visit via Video Note:  Mikayla Hicks  initiated request for Telemedicine visit with [facility] team. I connected with Mikayla Hicks  on 03/08/2019 at 4:48 PM  for a synchronized telemedicine visit using a video enabled HIPPA compliant telemedicine application. I verified that I am speaking with Mikayla Hicks  using two identifiers. Mikayla Balloon, Mikayla Hicks  was physically located in a [facility] Urgent care site and Mikayla Hicks was located at a different location.   The limitations of evaluation and management by telemedicine as well as the availability of in-person appointments were discussed. Patient was informed that she  may incur a bill ( including co-pay) for this virtual visit encounter. Mikayla Hicks  expressed understanding and gave verbal consent to proceed with virtual visit.     History of Present Illness:Mikayla Hicks  is a 33 y.o. female presents for evaluation of lower back pain and hemorrhoids.  She reports bilateral lower back pain x 10 days which she attributes to overuse at the gym.  She denies saddle anesthesia, bowel/bladder incontinence, numbness, paresthesias.  Her pain is worse with bending and improves with rest.  She has treated this at home with ibuprofen and ice/heat with minimal relief.  Her hemorrhoids are an ongoing problem; she was treated with hydrocortisone suppositories successfully 2 months ago.     No Known Allergies   Past Medical History:  Diagnosis Date  . Hemangioma      Social History   Tobacco Use  . Smoking status: Never Smoker  . Smokeless tobacco: Never Used  Substance Use Topics  . Alcohol use: Yes  . Drug use: No        Observations/Objective: Physical Exam  VITALS: Denies fever.  GENERAL: Alert, appears well and in no acute distress. HEENT: Atraumatic NECK: Normal movements of the head and neck. CARDIOPULMONARY: No increased WOB. Speaking in clear sentences. I:E ratio WNL.  MS: Moves all visible  extremities without noticeable abnormality. PSYCH: Pleasant and cooperative, well-groomed. Speech normal rate and rhythm. Affect is appropriate. Insight and judgement are appropriate. Attention is focused, linear, and appropriate.  NEURO: CN grossly intact. Oriented as arrived to appointment on time with no prompting. Moves both UE equally.     Assessment and Plan:    ICD-10-CM   1. Acute bilateral low back pain without sciatica  M54.5   2. Hemorrhoids, unspecified hemorrhoid type  K64.9        Follow Up Instructions: Treating back pain with naproxen and Flexeril.  Precautions for drowsiness with Flexeril discussed with patient.  Instructed her to return here or follow up with her PCP if her pain is not improving or gets worse; or if she develops new symptoms such as difficulty with urination, weakness, numbness, bowel/bladder incontinence, fever, chills, or other concerns. Treating hemorrhoids with hydrocortisone suppositories.  Instructed patient to follow up with surgeon if not improving.  Patient agrees to plan of care.      I discussed the assessment and treatment plan with the patient. The patient was provided an opportunity to ask questions and all were answered. The patient agreed with the plan and demonstrated an understanding of the instructions.   The patient was advised to call back or seek an in-person evaluation if the symptoms worsen or if the condition fails to improve as anticipated.      Mikayla Balloon, Mikayla Hicks  03/08/2019 4:48 PM         Mikayla Balloon,  Mikayla Hicks 03/08/19 1655

## 2019-03-08 NOTE — Discharge Instructions (Addendum)
Take the prescribed naproxen as needed for your pain.  Take the muscle relaxer Flexeril as needed for muscle spasm; do not drive, operate machinery, or drink alcohol with this medication as it may make you drowsy.    Return here or follow up with your primary care provider if your pain is not improving or gets worse; or if you develop new symptoms such as difficulty with urination, weakness, numbness, loss of control of your bladder or bowels, fever, or chills.    Use the prescribed suppositories twice a day.  Take a stool softener such as Colace.  Follow up with a surgeon for an evaluation of your hemorrhoids.

## 2019-04-16 DIAGNOSIS — N760 Acute vaginitis: Secondary | ICD-10-CM | POA: Diagnosis not present

## 2019-04-16 DIAGNOSIS — N9089 Other specified noninflammatory disorders of vulva and perineum: Secondary | ICD-10-CM | POA: Diagnosis not present

## 2019-04-16 MED FILL — NYSTATIN-TRIAMCINOLONE OINT: 100000-0.1 | 10 days supply | Qty: 30 | Fill #0

## 2019-04-16 MED FILL — TINIDAZOLE 500 MG TABS: 500 | 2 days supply | Qty: 8 | Fill #0

## 2019-04-23 DIAGNOSIS — N760 Acute vaginitis: Secondary | ICD-10-CM | POA: Diagnosis not present

## 2019-04-23 MED FILL — metroNIDAZOLE 500 MG TABS: 500 | 7 days supply | Qty: 14 | Fill #0

## 2019-05-05 DIAGNOSIS — Z118 Encounter for screening for other infectious and parasitic diseases: Secondary | ICD-10-CM | POA: Diagnosis not present

## 2019-05-05 DIAGNOSIS — Z01419 Encounter for gynecological examination (general) (routine) without abnormal findings: Secondary | ICD-10-CM | POA: Diagnosis not present

## 2019-05-05 DIAGNOSIS — Z6835 Body mass index (BMI) 35.0-35.9, adult: Secondary | ICD-10-CM | POA: Diagnosis not present

## 2019-08-27 ENCOUNTER — Ambulatory Visit (INDEPENDENT_AMBULATORY_CARE_PROVIDER_SITE_OTHER): Payer: 59 | Admitting: Psychology

## 2019-08-27 DIAGNOSIS — F4323 Adjustment disorder with mixed anxiety and depressed mood: Secondary | ICD-10-CM | POA: Diagnosis not present

## 2019-08-28 ENCOUNTER — Other Ambulatory Visit: Payer: Self-pay

## 2019-08-31 ENCOUNTER — Encounter: Payer: Self-pay | Admitting: Medical

## 2019-08-31 ENCOUNTER — Other Ambulatory Visit: Payer: Self-pay

## 2019-08-31 ENCOUNTER — Ambulatory Visit: Payer: 59 | Admitting: Medical

## 2019-08-31 VITALS — BP 117/85 | HR 80 | Resp 18 | Ht 71.0 in | Wt 226.0 lb

## 2019-08-31 DIAGNOSIS — J309 Allergic rhinitis, unspecified: Secondary | ICD-10-CM | POA: Diagnosis not present

## 2019-08-31 DIAGNOSIS — F39 Unspecified mood [affective] disorder: Secondary | ICD-10-CM | POA: Diagnosis not present

## 2019-08-31 DIAGNOSIS — F419 Anxiety disorder, unspecified: Secondary | ICD-10-CM

## 2019-08-31 DIAGNOSIS — G8929 Other chronic pain: Secondary | ICD-10-CM

## 2019-08-31 DIAGNOSIS — F909 Attention-deficit hyperactivity disorder, unspecified type: Secondary | ICD-10-CM

## 2019-08-31 DIAGNOSIS — M546 Pain in thoracic spine: Secondary | ICD-10-CM | POA: Diagnosis not present

## 2019-08-31 MED ORDER — BUSPIRONE HCL 7.5 MG PO TABS: 7.5000 mg | ORAL_TABLET | Freq: Two times a day (BID) | ORAL | 0 refills | Status: DC

## 2019-08-31 MED ORDER — BUPROPION HCL ER (XL) 150 MG PO TB24: 150.0000 mg | ORAL_TABLET | Freq: Every day | ORAL | 0 refills | Status: DC

## 2019-08-31 MED FILL — busPIRone HCL 7.5 MG TABS: 7.5 | 30 days supply | Qty: 60 | Fill #0

## 2019-08-31 MED FILL — buPROPion HCL ER (XL) 150 M: 150 | 30 days supply | Qty: 30 | Fill #0

## 2019-08-31 NOTE — Progress Notes (Signed)
Subjective:    Patient ID: Mikayla Hicks, female    DOB: [birth year], 34 y.o.   MRN: [id number]  HPI  Pt in for first time.   She grew up in [place]. Moved 6 years ago.  Pt states in past she used to be on Adderal. She was diagnosed with ADHD in the past. Pt tied vyvanse in the past but no meds for 6 years. Pt thinks she may have tried strattera in past.    Pt has some depression and anxiety over past year with covid seclusion and some family stress. No thoughts of harm to self or others.   States anxiety worse just recently but believes had since youth.   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 She works out about 5 days a week.   Pt states also has history of hemangioma in lower back. Pt had half of hemangioma removed and told to repeat mri every 8 years. Former Publishing rights manager retired. She has paperwork at home or at parents house.  Seasonal allergies. She has pollen and ragweed allergies. Pt on zyrtec now and she feels.   LMP- last week.    Review of Systems  Constitutional: Negative for chills, fatigue and fever.  HENT: Negative for congestion and ear pain.   Respiratory: Negative for cough, chest tightness, shortness of breath and wheezing.   Cardiovascular: Negative for chest pain and palpitations.  Gastrointestinal: Negative for abdominal pain, nausea and vomiting.  Genitourinary: Negative for dysuria, frequency and hematuria.  Musculoskeletal: Positive for back pain.  Skin: Negative for rash.  Neurological: Negative for dizziness, seizures, weakness, numbness and headaches.  Hematological: Negative for adenopathy. Does not bruise/bleed easily.  Psychiatric/Behavioral: Positive for decreased concentration and dysphoric mood. Negative for behavioral problems, confusion and hallucinations. The patient is nervous/anxious. The patient is not hyperactive.     Past Medical History:  Diagnosis Date  . Hemangioma      Social History   Socioeconomic  History  . Marital status: Divorced    Spouse name: Not on file  . Number of children: Not on file  . Years of education: Not on file  . Highest education level: Not on file  Occupational History  . Not on file  Tobacco Use  . Smoking status: Never Smoker  . Smokeless tobacco: Never Used  Substance and Sexual Activity  . Alcohol use: Yes  . Drug use: No  . Sexual activity: Not on file  Other Topics Concern  . Not on file  Social History Narrative  . Not on file   Social Determinants of Health   Financial Resource Strain:   . Difficulty of Paying Living Expenses:   Food Insecurity:   . Worried About Charity fundraiser in the Last Year:   . Arboriculturist in the Last Year:   Transportation Needs:   . Film/video editor (Medical):   Marland Kitchen Lack of Transportation (Non-Medical):   Physical Activity:   . Days of Exercise per Week:   . Minutes of Exercise per Session:   Stress:   . Feeling of Stress :   Social Connections:   . Frequency of Communication with Friends and Family:   . Frequency of Social Gatherings with Friends and Family:   . Attends Religious Services:   . Active Member of Clubs or Organizations:   . Attends Archivist Meetings:   Marland Kitchen Marital Status:   Intimate Partner Violence:   . Fear of Current or  Ex-Partner:   . Emotionally Abused:   Marland Kitchen Physically Abused:   . Sexually Abused:     Past Surgical History:  Procedure Laterality Date  . BACK SURGERY      No family history on file.  No Known Allergies  Current Outpatient Medications on File Prior to Visit  Medication Sig Dispense Refill  . cetirizine (ZYRTEC) 10 MG chewable tablet Chew 10 mg by mouth daily.    . cyclobenzaprine (FLEXERIL) 10 MG tablet Take 1 tablet (10 mg total) by mouth 2 (two) times daily as needed for muscle spasms. (Patient not taking: Reported on 08/31/2019) 20 tablet 0  . hydrocortisone (ANUSOL-HC) 25 MG suppository Place 1 suppository (25 mg total) rectally 2 (two)  times daily. (Patient not taking: Reported on 08/31/2019) 12 suppository 0  . magic mouthwash w/lidocaine SOLN Take 5 mLs by mouth 3 (three) times daily as needed for mouth pain. (Patient not taking: Reported on 12/08/2017) 100 mL 0  . naproxen (NAPROSYN) 375 MG tablet Take 1 tablet (375 mg total) by mouth 2 (two) times daily. 20 tablet 0  . Omega-3 Fatty Acids (FISH OIL) 1000 MG CAPS Take by mouth.    . predniSONE (STERAPRED UNI-PAK 21 TAB) 10 MG (21) TBPK tablet Take 6 tablets tomorrow, decrease by 1 each day till finished (6,5,4,3,2,1) (Patient not taking: Reported on 12/08/2017) 21 tablet 0   No current facility-administered medications on file prior to visit.    BP 117/85 (BP Location: Left Arm, Patient Position: Sitting, Cuff Size: Large)   Pulse 80   Resp 18   Ht 5\' 11"  (1.803 m)   Wt 226 lb (102.5 kg)   LMP 08/20/2019   SpO2 100%   BMI 31.52 kg/m       Objective:   Physical Exam   General Mental Status- Alert. General Appearance- Not in acute distress.   Skin General: Color- Normal Color. Moisture- Normal Moisture.  Neck Carotid Arteries- Normal color. Moisture- Normal Moisture. No carotid bruits. No JVD.  Chest and Lung Exam Auscultation: Breath Sounds:-Normal.  Cardiovascular Auscultation:Rythm- Regular. Murmurs & Other Heart Sounds:Auscultation of the heart reveals- No Murmurs.  Abdomen Inspection:-Inspeection Normal. Palpation/Percussion:Note:No mass. Palpation and Percussion of the abdomen reveal- Non Tender, Non Distended + BS, no rebound or guarding.    Neurologic Cranial Nerve exam:- CN III-XII intact(No nystagmus), symmetric smile. Drift Test:- No drift. Romberg Exam:- Negative.  Heal to Toe Gait exam:-Normal. Finger to Nose:- Normal/Intact Strength:- 5/5 equal and symmetric strength both upper and lower extremities.     Assessment & Plan:  You do appear to have ADHD by hx as well as self report scale. Will rx wellbutrin and see how you do with  that.  For anxiety add buspar in 7-10 days.  For depression will see if wellbutrin helps with mood.  For allergies continue zyrtec.  For hx of back pain and hemangioma refer to spine specialist. Recommend you find records and copy of most recent mri.  Follow up in one month or as needed  35 minutes spent with new pt.  Mackie Pai, PA-C

## 2019-08-31 NOTE — Patient Instructions (Signed)
You do appear to have ADHD by hx as well as self report scale. Will rx wellbutrin and see how you do with that.  For anxiety add buspar in 7-10 days.  For depression will see if wellbutrin helps with mood.  For allergies continue zyrtec.  For hx of back pain and hemangioma refer to spine specialist. Recommend you find records and copy of most recent mri.  Follow up in one month or as needed

## 2019-09-21 ENCOUNTER — Ambulatory Visit (INDEPENDENT_AMBULATORY_CARE_PROVIDER_SITE_OTHER): Payer: 59 | Admitting: Psychology

## 2019-09-21 DIAGNOSIS — F4323 Adjustment disorder with mixed anxiety and depressed mood: Secondary | ICD-10-CM

## 2019-09-30 ENCOUNTER — Encounter: Payer: Self-pay | Admitting: Medical

## 2019-09-30 ENCOUNTER — Other Ambulatory Visit: Payer: Self-pay

## 2019-09-30 ENCOUNTER — Ambulatory Visit: Payer: 59 | Admitting: Medical

## 2019-09-30 VITALS — BP 111/72 | HR 69 | Temp 97.5°F | Resp 12 | Ht 71.0 in | Wt 218.0 lb

## 2019-09-30 DIAGNOSIS — F909 Attention-deficit hyperactivity disorder, unspecified type: Secondary | ICD-10-CM | POA: Diagnosis not present

## 2019-09-30 DIAGNOSIS — F419 Anxiety disorder, unspecified: Secondary | ICD-10-CM

## 2019-09-30 MED ORDER — BUPROPION HCL ER (XL) 300 MG PO TB24: 300.0000 mg | ORAL_TABLET | Freq: Every day | ORAL | 3 refills | Status: DC

## 2019-09-30 MED ORDER — BUSPIRONE HCL 7.5 MG PO TABS: 7.5000 mg | ORAL_TABLET | Freq: Two times a day (BID) | ORAL | 0 refills | Status: DC

## 2019-09-30 MED FILL — busPIRone HCL 7.5 MG TABS: 7.5 | 30 days supply | Qty: 60 | Fill #0

## 2019-09-30 MED FILL — BUPROPION HCL ER (XL) 300 M: 300 | 30 days supply | Qty: 30 | Fill #0

## 2019-09-30 NOTE — Progress Notes (Signed)
Subjective:    Patient ID: Mikayla Hicks, female    DOB: [birth year], 34 y.o.   MRN: [id number]  HPI  Pt has ADHD, depression  and some anxiety.  I had written wellbutrin hoping that would help with her mood and her ADHD.  She thinks mood is little better. She feels like medication did not helped early during the day with her concentration. Then effect waned.  Pt also describes ocd type behavior. She counts he steps walking. She does not like odd numbers. She second guesses what she should have said during conversations.    Pt thinks buspar does help with anxiety.  Pt filled out ADHD questioneer but I don't see that in media. So got her to fill out again.       Review of Systems  Constitutional: Negative for chills, fatigue and fever.  Respiratory: Negative for cough, chest tightness, shortness of breath and wheezing.   Cardiovascular: Negative for chest pain and palpitations.  Gastrointestinal: Negative for abdominal pain.  Musculoskeletal: Negative for back pain and neck pain.  Skin: Negative for rash.  Neurological: Negative for dizziness and headaches.  Hematological: Negative for adenopathy. Does not bruise/bleed easily.       Less anxiety with buspar.  Psychiatric/Behavioral: Positive for decreased concentration and dysphoric mood. Negative for behavioral problems, confusion and suicidal ideas. The patient is nervous/anxious.    Past Medical History:  Diagnosis Date  . Allergy   . Anxiety    just recently and seeing therapist.  . Hemangioma      Social History   Socioeconomic History  . Marital status: Divorced    Spouse name: Not on file  . Number of children: Not on file  . Years of education: Not on file  . Highest education level: Not on file  Occupational History  . Not on file  Tobacco Use  . Smoking status: Never Smoker  . Smokeless tobacco: Never Used  Substance and Sexual Activity  . Alcohol use: Yes    Comment: rare and if does very  little.  . Drug use: No  . Sexual activity: Yes    Birth control/protection: None  Other Topics Concern  . Not on file  Social History Narrative  . Not on file   Social Determinants of Health   Financial Resource Strain:   . Difficulty of Paying Living Expenses:   Food Insecurity:   . Worried About Charity fundraiser in the Last Year:   . Arboriculturist in the Last Year:   Transportation Needs:   . Film/video editor (Medical):   Marland Kitchen Lack of Transportation (Non-Medical):   Physical Activity:   . Days of Exercise per Week:   . Minutes of Exercise per Session:   Stress:   . Feeling of Stress :   Social Connections:   . Frequency of Communication with Friends and Family:   . Frequency of Social Gatherings with Friends and Family:   . Attends Religious Services:   . Active Member of Clubs or Organizations:   . Attends Archivist Meetings:   Marland Kitchen Marital Status:   Intimate Partner Violence:   . Fear of Current or Ex-Partner:   . Emotionally Abused:   Marland Kitchen Physically Abused:   . Sexually Abused:     Past Surgical History:  Procedure Laterality Date  . BACK SURGERY      No family history on file.  No Known Allergies  Current Outpatient Medications on File Prior  to Visit  Medication Sig Dispense Refill  . buPROPion (WELLBUTRIN XL) 150 MG 24 hr tablet Take 1 tablet (150 mg total) by mouth daily. 30 tablet 0  . busPIRone (BUSPAR) 7.5 MG tablet Take 1 tablet (7.5 mg total) by mouth 2 (two) times daily. 60 tablet 0  . cetirizine (ZYRTEC) 10 MG chewable tablet Chew 10 mg by mouth daily.    . cyclobenzaprine (FLEXERIL) 10 MG tablet Take 1 tablet (10 mg total) by mouth 2 (two) times daily as needed for muscle spasms. 20 tablet 0  . naproxen (NAPROSYN) 375 MG tablet Take 1 tablet (375 mg total) by mouth 2 (two) times daily. 20 tablet 0  . Omega-3 Fatty Acids (FISH OIL) 1000 MG CAPS Take by mouth.     No current facility-administered medications on file prior to visit.      BP 111/72 (BP Location: Right Arm, Cuff Size: Large)   Pulse 69   Temp (!) 97.5 F (36.4 C) (Temporal)   Resp 12   Ht 5\' 11"  (1.803 m)   Wt 218 lb (98.9 kg)   SpO2 100%   BMI 30.40 kg/m       Objective:   Physical Exam  General- No acute distress. Pleasant patient. Neck- Full range of motion, no jvd Lungs- Clear, even and unlabored. Heart- regular rate and rhythm. Neurologic- CNII- XII grossly intact.       Assessment & Plan:  For ADHD, you did not get ideal desired effect from 150 mg daily dose. Will rx higher dose of 300 mg daily dose. Declined offer to rx stimulant and get uds through other facility that would be more reasonable price.  You do describe some potential ocd type features with mood disorder. Will see how your do with higher dose wellbutrin. Might add low dose ssri in future but need to do so with caution.   For anxiety continue with buspar. Sounds like doing well.  Follow up 1 month or as needed.   Time spent with patient today was  30 minutes which consisted of chart review, discussing diagnoses,  treatment options, uds options if stimulant type med used and documentation.  Mackie Pai, PA-C

## 2019-09-30 NOTE — Patient Instructions (Signed)
For ADHD, you did not get ideal desired effect from 150 mg daily dose. Will rx higher dose of 300 mg daily dose. Declined offer to rx stimulant and get uds through other facility that would be more reasonable price.  You do describe some potential ocd type features with mood disorder. Will see how your do with higher dose wellbutrin. Might add low dose ssri in future but need to do so with caution.   For anxiety continue with buspar. Sounds like doing well.  Follow up 1 month or as needed.

## 2019-10-05 ENCOUNTER — Ambulatory Visit (INDEPENDENT_AMBULATORY_CARE_PROVIDER_SITE_OTHER): Payer: 59 | Admitting: Psychology

## 2019-10-05 DIAGNOSIS — F4323 Adjustment disorder with mixed anxiety and depressed mood: Secondary | ICD-10-CM

## 2019-10-07 DIAGNOSIS — M961 Postlaminectomy syndrome, not elsewhere classified: Secondary | ICD-10-CM | POA: Diagnosis not present

## 2019-10-07 DIAGNOSIS — M5414 Radiculopathy, thoracic region: Secondary | ICD-10-CM | POA: Diagnosis not present

## 2019-10-07 DIAGNOSIS — D1809 Hemangioma of other sites: Secondary | ICD-10-CM | POA: Diagnosis not present

## 2019-10-07 DIAGNOSIS — Z6829 Body mass index (BMI) 29.0-29.9, adult: Secondary | ICD-10-CM | POA: Diagnosis not present

## 2019-10-07 DIAGNOSIS — M546 Pain in thoracic spine: Secondary | ICD-10-CM | POA: Diagnosis not present

## 2019-10-15 ENCOUNTER — Other Ambulatory Visit: Payer: Self-pay | Admitting: Rehabilitation

## 2019-10-15 DIAGNOSIS — M5414 Radiculopathy, thoracic region: Secondary | ICD-10-CM

## 2019-10-26 ENCOUNTER — Ambulatory Visit (INDEPENDENT_AMBULATORY_CARE_PROVIDER_SITE_OTHER): Payer: 59 | Admitting: Psychology

## 2019-10-26 DIAGNOSIS — F4323 Adjustment disorder with mixed anxiety and depressed mood: Secondary | ICD-10-CM

## 2019-11-02 ENCOUNTER — Other Ambulatory Visit: Payer: Self-pay

## 2019-11-02 ENCOUNTER — Telehealth (INDEPENDENT_AMBULATORY_CARE_PROVIDER_SITE_OTHER): Payer: 59 | Admitting: Medical

## 2019-11-02 DIAGNOSIS — F909 Attention-deficit hyperactivity disorder, unspecified type: Secondary | ICD-10-CM | POA: Diagnosis not present

## 2019-11-02 DIAGNOSIS — F39 Unspecified mood [affective] disorder: Secondary | ICD-10-CM | POA: Diagnosis not present

## 2019-11-02 DIAGNOSIS — F419 Anxiety disorder, unspecified: Secondary | ICD-10-CM | POA: Diagnosis not present

## 2019-11-02 MED ORDER — BUSPIRONE HCL 7.5 MG PO TABS: 7.5000 mg | ORAL_TABLET | Freq: Two times a day (BID) | ORAL | 4 refills | Status: DC

## 2019-11-02 MED FILL — busPIRone HCL 7.5 MG TABS: 7.5 | 30 days supply | Qty: 60 | Fill #0

## 2019-11-02 NOTE — Progress Notes (Signed)
   Subjective:    Patient ID: Mikayla Hicks, female    DOB: 02/25/1986, 34 y.o.   MRN: [id number]  HPI  Virtual Visit via Video Note  I connected with Mikayla Hicks on 11/02/19 at 11:00 AM EDT by a video enabled telemedicine application and verified that I am speaking with the correct person using two identifiers.  Location: Patient: home Provider: office   I discussed the limitations of evaluation and management by telemedicine and the availability of in person appointments. The patient expressed understanding and agreed to proceed.  Pt has no bp cuff at home.   History of Present Illness: Pt states her concentration is better with higher dose. States it helps adequate.  Pt states also her mood is better and less anxious with welbutrin.  Pt is still taking buspar lower dose 7.5 mg twice daily.  Also less ocd type behaviors per pt.  lmp- one week ago.   Observations/Objective: General-no acute distress, pleasant, oriented. Lungs- on inspection lungs appear unlabored. Neck- no tracheal deviation or jvd on inspection. Neuro- gross motor function appears intact.  Assessment and Plan: Your concentration, mood and anxiety are all improved with combination of wellbutrin and buspar.  Advise continue current dosages and will send in refills of buspar to your pharmacy.  Follow up in 4-5 months or as needed  Time spent with patient today was 15  minutes which consisted of chart review, discussing diagnosis, work up treatment and documentation.  Follow Up Instructions:    I discussed the assessment and treatment plan with the patient. The patient was provided an opportunity to ask questions and all were answered. The patient agreed with the plan and demonstrated an understanding of the instructions.   The patient was advised to call back or seek an in-person evaluation if the symptoms worsen or if the condition fails to improve as anticipated.     Mackie Pai,  PA-C    Review of Systems     Objective:   Physical Exam        Assessment & Plan:

## 2019-11-02 NOTE — Patient Instructions (Addendum)
Your concentration, mood and anxiety are all improved with combination of wellbutrin and buspar.  Advise continue current dosages and will send in refills of buspar to your pharmacy.  Follow up in 4-5 months or as needed

## 2019-11-11 ENCOUNTER — Ambulatory Visit (INDEPENDENT_AMBULATORY_CARE_PROVIDER_SITE_OTHER): Payer: 59 | Admitting: Psychology

## 2019-11-11 DIAGNOSIS — F4323 Adjustment disorder with mixed anxiety and depressed mood: Secondary | ICD-10-CM

## 2019-11-12 MED FILL — BUPROPION HCL ER (XL) 300 M: 300 | 30 days supply | Qty: 30 | Fill #1

## 2019-11-25 ENCOUNTER — Ambulatory Visit (INDEPENDENT_AMBULATORY_CARE_PROVIDER_SITE_OTHER): Payer: 59 | Admitting: Psychology

## 2019-11-25 DIAGNOSIS — F4323 Adjustment disorder with mixed anxiety and depressed mood: Secondary | ICD-10-CM | POA: Diagnosis not present

## 2019-11-27 ENCOUNTER — Other Ambulatory Visit: Payer: 59

## 2019-12-02 MED FILL — BUPROPION HCL ER (XL) 300 M: 300 | 30 days supply | Qty: 30 | Fill #1

## 2019-12-09 ENCOUNTER — Ambulatory Visit (INDEPENDENT_AMBULATORY_CARE_PROVIDER_SITE_OTHER): Payer: 59 | Admitting: Psychology

## 2019-12-09 DIAGNOSIS — F4323 Adjustment disorder with mixed anxiety and depressed mood: Secondary | ICD-10-CM

## 2019-12-30 ENCOUNTER — Ambulatory Visit: Payer: 59 | Admitting: Psychology

## 2020-01-04 ENCOUNTER — Other Ambulatory Visit: Payer: 59

## 2020-01-11 ENCOUNTER — Ambulatory Visit: Payer: 59 | Admitting: Psychology

## 2020-01-12 ENCOUNTER — Other Ambulatory Visit: Payer: Self-pay

## 2020-01-12 ENCOUNTER — Telehealth: Payer: 59 | Admitting: Medical

## 2020-01-12 NOTE — Progress Notes (Signed)
   Subjective:    Patient ID: Mikayla Hicks, female    DOB: [birth year], 34 y.o.   MRN: [id number]  HPI  No charge. Rescheduled.  Review of Systems     Objective:   Physical Exam        Assessment & Plan:

## 2020-01-13 ENCOUNTER — Other Ambulatory Visit: Payer: Self-pay

## 2020-01-13 ENCOUNTER — Telehealth (INDEPENDENT_AMBULATORY_CARE_PROVIDER_SITE_OTHER): Payer: 59 | Admitting: Medical

## 2020-01-13 VITALS — HR 68

## 2020-01-13 DIAGNOSIS — L659 Nonscarring hair loss, unspecified: Secondary | ICD-10-CM

## 2020-01-13 DIAGNOSIS — F419 Anxiety disorder, unspecified: Secondary | ICD-10-CM

## 2020-01-13 DIAGNOSIS — F988 Other specified behavioral and emotional disorders with onset usually occurring in childhood and adolescence: Secondary | ICD-10-CM

## 2020-01-13 DIAGNOSIS — R5383 Other fatigue: Secondary | ICD-10-CM

## 2020-01-13 NOTE — Progress Notes (Signed)
Subjective:    Patient ID: Mikayla Hicks, female    DOB: [birth year], 34 y.o.   MRN: [id number]  HPI  Virtual Visit via Video Note  I connected with VINCENT EHRLER on 01/13/20 at  4:40 PM EDT by a video enabled telemedicine application and verified that I am speaking with the correct person using two identifiers.  Location: Patient: home. Provider: office   I discussed the limitations of evaluation and management by telemedicine and the availability of in person appointments. The patient expressed understanding and agreed to proceed.  History of Present Illness: Pt in for follow up.  Pt has history of poor concentration, mood and anxiety.   She states her mood and concentration was good with wellbutrin 300 mg daily. Then shortly after went to 300 mg dosenoted hair loss. She states every time she brushed hair or washed hair would fall out. She stopped wellbutrin about 2 weeks ago. Pt stats concentration and ocd thought pattern same as before when she was not taking medication. Mood feels good and anxiety controlled with buspar.   Pt states hair not growing back yet. She tried collagen and prenatal vitamins. Not having fatigue.   Pt willing to try stimulant. In past with her insurance had concern about uds not being covered.       Observations/Objective: General-no acute distress, pleasant, oriented. Lungs- on inspection lungs appear unlabored. Neck- no tracheal deviation or jvd on inspection. Neuro- gross motor function appears intact. Skin/hair-on video it does appear that she has some receding hairline and thinning in the front hair region more on the left side.  Assessment and Plan: History of ADD, OCD, slight decreased mood and anxiety.  Anxiety and mood seem well controlled with BuSpar per your report.  However ADD and OCD type thoughts seen worse per your report after you stopped Wellbutrin.  In the past had potential cost concerns about getting UDS to our facility.   Since she stopped Wellbutrin will try stimulant type medication.  First I want you to get a UDS 10 panel [facility] on [address].  Have them do the test today and fax it to my attention.  Fax number given.  If studies are negative then will likely give low dose stimulant.  Want to follow you closely and see if stimulant effects your OCD type features.  For hair loss and fatigue, and placed future order to get TSH, T4, CBC, iron and vitamin D.  If all the studies are negative and you are still losing hair then will refer you to dermatologist.  Call to get scheduled for the lab.  Also I want you to sign controlled medication contract on the day you get the labs.  Follow-up date will be 1 month after starting stimulant type medication for ADD.  Mackie Pai, PA-C   Time spent with patient today was   minutes which consisted of chart revdiew, discussing diagnosis, work up treatment and documentation.  Follow Up Instructions:    I discussed the assessment and treatment plan with the patient. The patient was provided an opportunity to ask questions and all were answered. The patient agreed with the plan and demonstrated an understanding of the instructions.   The patient was advised to call back or seek an in-person evaluation if the symptoms worsen or if the condition fails to improve as anticipated.  Time spent with patient today was 30  minutes which consisted of chart revdiew, discussing diagnosis, work  u,p treatment and documentation.   Mackie Pai, PA-C   Review of Systems     Objective:   Physical Exam        Assessment & Plan:

## 2020-01-13 NOTE — Patient Instructions (Signed)
History of ADD, OCD, slight decreased mood and anxiety.  Anxiety and mood seem well controlled with BuSpar per your report.  However ADD and OCD type thoughts seen worse per your report after you stopped Wellbutrin.  In the past had potential cost concerns about getting UDS to our facility.  Since she stopped Wellbutrin will try stimulant type medication.  First I want you to get a UDS 10 panel study through [facility] which [facility] located on [address].  Have them do the test today and fax it to my attention.  Fax number given.  If studies are negative then will likely give low dose stimulant.  Want to follow you closely and see if stimulant effects your OCD type features.  For hair loss and fatigue, and placed future order to get TSH, T4, CBC, iron and vitamin D.  If all the studies are negative and you are still losing hair then will refer you to dermatologist.  Call to get scheduled for the lab.  Also I want you to sign controlled medication contract on the day you get the labs.  Follow-up date will be 1 month after starting stimulant type medication for ADD.

## 2020-01-25 ENCOUNTER — Ambulatory Visit: Payer: 59 | Admitting: Psychology

## 2020-02-04 ENCOUNTER — Other Ambulatory Visit: Payer: Self-pay

## 2020-02-04 ENCOUNTER — Ambulatory Visit: Payer: 59 | Admitting: Medical

## 2020-02-04 VITALS — BP 109/77 | HR 72 | Resp 18 | Ht 71.0 in | Wt 213.8 lb

## 2020-02-04 DIAGNOSIS — F419 Anxiety disorder, unspecified: Secondary | ICD-10-CM

## 2020-02-04 DIAGNOSIS — F909 Attention-deficit hyperactivity disorder, unspecified type: Secondary | ICD-10-CM

## 2020-02-04 DIAGNOSIS — L659 Nonscarring hair loss, unspecified: Secondary | ICD-10-CM

## 2020-02-04 DIAGNOSIS — Z3169 Encounter for other general counseling and advice on procreation: Secondary | ICD-10-CM | POA: Diagnosis not present

## 2020-02-04 DIAGNOSIS — R5383 Other fatigue: Secondary | ICD-10-CM | POA: Diagnosis not present

## 2020-02-04 MED ORDER — LISDEXAMFETAMINE DIMESYLATE 20 MG PO CAPS: 20.0000 mg | ORAL_CAPSULE | Freq: Every day | ORAL | 0 refills | Status: DC

## 2020-02-04 MED FILL — VYVANSE 20 MG CAPSULE: 20 | 30 days supply | Qty: 30 | Fill #0

## 2020-02-04 NOTE — Progress Notes (Signed)
Subjective:    Patient ID: Mikayla Hicks, female    DOB: February 04, 1986, 34 y.o.   MRN: [id number]  HPI  Pt in for follow up.  Pt has ADD, OCD and anxiety.  Buspar helps adequately with anxiety.  Pt has hx of ADD. She tried wellbutrin in past did not help. Pt tried stimulant type medication in early 20's. Pt used vyvanse in the past and states it did help.   Last time I talked with her wanted her to get [facility] at [facility] so we could initiate controlled med/stimulant type med. Also place on contract.  Pt is still loosing hair. She shows me pictures of large amounts. She has not gotten labs done yet. Wanted labs done then was planning on referring to derm. Last visit video.    Review of Systems  Constitutional: Positive for fatigue. Negative for chills and fever.  Respiratory: Negative for cough, chest tightness, shortness of breath and wheezing.   Cardiovascular: Negative for chest pain and palpitations.  Gastrointestinal: Negative for abdominal pain.  Genitourinary: Negative for dysuria.  Musculoskeletal: Negative for back pain.  Neurological: Negative for dizziness, weakness, light-headedness and headaches.  Hematological: Negative for adenopathy. Does not bruise/bleed easily.  Psychiatric/Behavioral: Positive for decreased concentration. Negative for behavioral problems, confusion, hallucinations and suicidal ideas. The patient is nervous/anxious.     Past Medical History:  Diagnosis Date  . Allergy   . Anxiety    just recently and seeing therapist.  . Hemangioma      Social History   Socioeconomic History  . Marital status: Divorced    Spouse name: Not on file  . Number of children: Not on file  . Years of education: Not on file  . Highest education level: Not on file  Occupational History  . Not on file  Tobacco Use  . Smoking status: Never Smoker  . Smokeless tobacco: Never Used  Substance and Sexual Activity  . Alcohol use: Yes    Comment: rare and if  does very little.  . Drug use: No  . Sexual activity: Yes    Birth control/protection: None  Other Topics Concern  . Not on file  Social History Narrative  . Not on file   Social Determinants of Health   Financial Resource Strain:   . Difficulty of Paying Living Expenses: Not on file  Food Insecurity:   . Worried About Charity fundraiser in the Last Year: Not on file  . Ran Out of Food in the Last Year: Not on file  Transportation Needs:   . Lack of Transportation (Medical): Not on file  . Lack of Transportation (Non-Medical): Not on file  Physical Activity:   . Days of Exercise per Week: Not on file  . Minutes of Exercise per Session: Not on file  Stress:   . Feeling of Stress : Not on file  Social Connections:   . Frequency of Communication with Friends and Family: Not on file  . Frequency of Social Gatherings with Friends and Family: Not on file  . Attends Religious Services: Not on file  . Active Member of Clubs or Organizations: Not on file  . Attends Archivist Meetings: Not on file  . Marital Status: Not on file  Intimate Partner Violence:   . Fear of Current or Ex-Partner: Not on file  . Emotionally Abused: Not on file  . Physically Abused: Not on file  . Sexually Abused: Not on file    Past  Surgical History:  Procedure Laterality Date  . BACK SURGERY      No family history on file.  No Known Allergies  Current Outpatient Medications on File Prior to Visit  Medication Sig Dispense Refill  . buPROPion (WELLBUTRIN XL) 300 MG 24 hr tablet Take 1 tablet (300 mg total) by mouth daily. (Patient not taking: Reported on 01/13/2020) 30 tablet 3  . busPIRone (BUSPAR) 7.5 MG tablet Take 1 tablet (7.5 mg total) by mouth 2 (two) times daily. 60 tablet 4  . cetirizine (ZYRTEC) 10 MG chewable tablet Chew 10 mg by mouth daily.    . cyclobenzaprine (FLEXERIL) 10 MG tablet Take 1 tablet (10 mg total) by mouth 2 (two) times daily as needed for muscle spasms. (Patient  not taking: Reported on 01/13/2020) 20 tablet 0  . naproxen (NAPROSYN) 375 MG tablet Take 1 tablet (375 mg total) by mouth 2 (two) times daily. (Patient not taking: Reported on 01/13/2020) 20 tablet 0  . Omega-3 Fatty Acids (FISH OIL) 1000 MG CAPS Take by mouth.     No current facility-administered medications on file prior to visit.    BP 109/77 (BP Location: Left Arm, Patient Position: Sitting, Cuff Size: Large)   Pulse 72   Resp 18   Ht 5\' 11"  (1.803 m)   Wt 213 lb 12.8 oz (97 kg)   LMP 01/10/2020   SpO2 100%   BMI 29.82 kg/m       Objective:   Physical Exam  General Mental Status- Alert. General Appearance- Not in acute distress.   Skin General: Color- Normal Color. Moisture- Normal Moisture. Hair thinning in frontal scalp  area.  Neck Carotid Arteries- Normal color. Moisture- Normal Moisture. No carotid bruits. No JVD.  Chest and Lung Exam Auscultation: Breath Sounds:-Normal.  Cardiovascular Auscultation:Rythm- Regular. Murmurs & Other Heart Sounds:Auscultation of the heart reveals- No Murmurs.   Derm- scalp. obviouss hair loss on inspection. Frontal area. Neurologic Cranial Nerve exam:- CN III-XII intact(No nystagmus), symmetric smile. Strength:- 5/5 equal and symmetric strength both upper and lower extremities.      Assessment & Plan:  For ADD will rx vyvanse. UDS reviewed and controlled med agreeement signed.  For anxiety continue buspar. Doing well.  For fatigue get labs today.  For hair loss placed referral to dermatologist.  Follow up 1 month or as needed. Thereafter every 4 months for controlled meds.  Time spent with patient today was 30 minutes which consisted of chart revdiew, discussing diagnosis, work up treatment and documentation.

## 2020-02-04 NOTE — Patient Instructions (Signed)
For ADD will rx vyvanse. UDS reviewed and controlled med agreeement signed.  For anxiety continue buspar. Doing well.  For fatigue get labs today.  For hair loss placed referral to dermatologist.  Follow up 1 month or as needed. Thereafter every 4 months for controlled meds.

## 2020-02-07 LAB — VITAMIN B1: Vitamin B1 (Thiamine): 11 nmol/L (ref 8–30)

## 2020-02-10 ENCOUNTER — Other Ambulatory Visit: Payer: 59

## 2020-02-11 LAB — COMPREHENSIVE METABOLIC PANEL
AG Ratio: 1.7 (calc) (ref 1.0–2.5)
ALT: 10 U/L (ref 6–29)
AST: 12 U/L (ref 10–30)
Albumin: 4 g/dL (ref 3.6–5.1)
Alkaline phosphatase (APISO): 59 U/L (ref 31–125)
BUN: 11 mg/dL (ref 7–25)
CO2: 26 mmol/L (ref 20–32)
Calcium: 9.2 mg/dL (ref 8.6–10.2)
Chloride: 104 mmol/L (ref 98–110)
Creat: 0.97 mg/dL (ref 0.50–1.10)
Globulin: 2.3 g/dL (calc) (ref 1.9–3.7)
Glucose, Bld: 97 mg/dL (ref 65–99)
Potassium: 4.7 mmol/L (ref 3.5–5.3)
Sodium: 140 mmol/L (ref 135–146)
Total Bilirubin: 0.5 mg/dL (ref 0.2–1.2)
Total Protein: 6.3 g/dL (ref 6.1–8.1)

## 2020-02-11 LAB — CBC WITH DIFFERENTIAL/PLATELET
Absolute Monocytes: 537 cells/uL (ref 200–950)
Basophils Absolute: 41 cells/uL (ref 0–200)
Basophils Relative: 0.6 %
Eosinophils Absolute: 143 cells/uL (ref 15–500)
Eosinophils Relative: 2.1 %
HCT: 42.7 % (ref 35.0–45.0)
Hemoglobin: 14 g/dL (ref 11.7–15.5)
Lymphs Abs: 1850 cells/uL (ref 850–3900)
MCH: 27.9 pg (ref 27.0–33.0)
MCHC: 32.8 g/dL (ref 32.0–36.0)
MCV: 85.2 fL (ref 80.0–100.0)
MPV: 11.3 fL (ref 7.5–12.5)
Monocytes Relative: 7.9 %
Neutro Abs: 4230 cells/uL (ref 1500–7800)
Neutrophils Relative %: 62.2 %
Platelets: 264 10*3/uL (ref 140–400)
RBC: 5.01 10*6/uL (ref 3.80–5.10)
RDW: 12.1 % (ref 11.0–15.0)
Total Lymphocyte: 27.2 %
WBC: 6.8 10*3/uL (ref 3.8–10.8)

## 2020-02-11 LAB — VITAMIN D 1,25 DIHYDROXY
Vitamin D 1, 25 (OH)2 Total: 33 pg/mL (ref 18–72)
Vitamin D2 1, 25 (OH)2: <8 pg/mL
Vitamin D3 1, 25 (OH)2: 33 pg/mL

## 2020-02-11 LAB — T4, FREE: Free T4: 1.2 ng/dL (ref 0.8–1.8)

## 2020-02-11 LAB — IRON: Iron: 74 ug/dL (ref 40–190)

## 2020-02-11 LAB — VITAMIN B12: Vitamin B-12: 404 pg/mL (ref 200–1100)

## 2020-02-11 LAB — TSH: TSH: 1.76 mIU/L

## 2020-03-07 ENCOUNTER — Other Ambulatory Visit: Payer: Self-pay

## 2020-03-07 ENCOUNTER — Encounter: Payer: Self-pay | Admitting: Medical

## 2020-03-07 ENCOUNTER — Ambulatory Visit: Payer: 59 | Admitting: Medical

## 2020-03-07 VITALS — BP 114/74 | HR 77 | Temp 98.3°F | Resp 18 | Ht 71.0 in | Wt 210.0 lb

## 2020-03-07 DIAGNOSIS — F988 Other specified behavioral and emotional disorders with onset usually occurring in childhood and adolescence: Secondary | ICD-10-CM

## 2020-03-07 DIAGNOSIS — L659 Nonscarring hair loss, unspecified: Secondary | ICD-10-CM

## 2020-03-07 DIAGNOSIS — F419 Anxiety disorder, unspecified: Secondary | ICD-10-CM | POA: Diagnosis not present

## 2020-03-07 MED ORDER — LISDEXAMFETAMINE DIMESYLATE 20 MG PO CAPS: 20.0000 mg | ORAL_CAPSULE | Freq: Every day | ORAL | 0 refills | Status: DC

## 2020-03-07 MED FILL — VYVANSE 20 MG CAPSULE: 20 | 30 days supply | Qty: 30 | Fill #0

## 2020-03-07 NOTE — Patient Instructions (Addendum)
Glad to hear doing well with both vyvanse for ADD and buspar for anxiety. Will continue prescribing both.   Refilled vyanse today. You have one more month of buspar.  Keep dermatologist appointment.  Follow up in 4 months controlled med visit  or as needed

## 2020-03-07 NOTE — Progress Notes (Signed)
Subjective:    Patient ID: Mikayla Hicks, female    DOB: [birth year], 34 y.o.   MRN: [id number]  HPI Pt I for follow up.  Pt states vyvanse has helped a lot with her attention. Able to concentrate a lot better with her work at [employer].   No severe side effects noted. Only states mild insomnia but melatonin will help her sleep.   Pt has some anxiety and she states buspar is helping as well. She need to get her refill.  Pt fatigue work up was negative. Pt energy is better compared to past.  Pt was having some hair loss. Pt states hair loss is less now. She does have appointment with derm in 2 weeks.  lmp- 03-01-2020.  Review of Systems  Constitutional: Negative for chills, fatigue and fever.  Cardiovascular: Negative for chest pain and palpitations.  Gastrointestinal: Negative for abdominal pain, constipation, nausea and vomiting.  Musculoskeletal: Negative for back pain.  Skin: Negative for rash.  Neurological: Negative for dizziness, seizures, syncope and weakness.  Hematological: Negative for adenopathy. Does not bruise/bleed easily.  Psychiatric/Behavioral: Positive for decreased concentration. Negative for agitation and dysphoric mood. The patient is nervous/anxious.        Meds helping a lot.    Past Medical History:  Diagnosis Date  . Allergy   . Anxiety    just recently and seeing therapist.  . Hemangioma      Social History   Socioeconomic History  . Marital status: Divorced    Spouse name: Not on file  . Number of children: Not on file  . Years of education: Not on file  . Highest education level: Not on file  Occupational History  . Not on file  Tobacco Use  . Smoking status: Never Smoker  . Smokeless tobacco: Never Used  Substance and Sexual Activity  . Alcohol use: Yes    Comment: rare and if does very little.  . Drug use: No  . Sexual activity: Yes    Birth control/protection: None  Other Topics Concern  . Not on file  Social History Narrative    . Not on file   Social Determinants of Health   Financial Resource Strain:   . Difficulty of Paying Living Expenses: Not on file  Food Insecurity:   . Worried About Charity fundraiser in the Last Year: Not on file  . Ran Out of Food in the Last Year: Not on file  Transportation Needs:   . Lack of Transportation (Medical): Not on file  . Lack of Transportation (Non-Medical): Not on file  Physical Activity:   . Days of Exercise per Week: Not on file  . Minutes of Exercise per Session: Not on file  Stress:   . Feeling of Stress : Not on file  Social Connections:   . Frequency of Communication with Friends and Family: Not on file  . Frequency of Social Gatherings with Friends and Family: Not on file  . Attends Religious Services: Not on file  . Active Member of Clubs or Organizations: Not on file  . Attends Archivist Meetings: Not on file  . Marital Status: Not on file  Intimate Partner Violence:   . Fear of Current or Ex-Partner: Not on file  . Emotionally Abused: Not on file  . Physically Abused: Not on file  . Sexually Abused: Not on file    Past Surgical History:  Procedure Laterality Date  . BACK SURGERY  No family history on file.  No Known Allergies  Current Outpatient Medications on File Prior to Visit  Medication Sig Dispense Refill  . buPROPion (WELLBUTRIN XL) 300 MG 24 hr tablet Take 1 tablet (300 mg total) by mouth daily. (Patient not taking: Reported on 01/13/2020) 30 tablet 3  . busPIRone (BUSPAR) 7.5 MG tablet Take 1 tablet (7.5 mg total) by mouth 2 (two) times daily. 60 tablet 4  . cetirizine (ZYRTEC) 10 MG chewable tablet Chew 10 mg by mouth daily.    . cyclobenzaprine (FLEXERIL) 10 MG tablet Take 1 tablet (10 mg total) by mouth 2 (two) times daily as needed for muscle spasms. (Patient not taking: Reported on 01/13/2020) 20 tablet 0  . lisdexamfetamine (VYVANSE) 20 MG capsule Take 1 capsule (20 mg total) by mouth daily. 30 capsule 0  .  naproxen (NAPROSYN) 375 MG tablet Take 1 tablet (375 mg total) by mouth 2 (two) times daily. (Patient not taking: Reported on 01/13/2020) 20 tablet 0  . Omega-3 Fatty Acids (FISH OIL) 1000 MG CAPS Take by mouth.     No current facility-administered medications on file prior to visit.    BP 114/74   Pulse 77   Temp 98.3 F (36.8 C) (Oral)   Resp 18   Ht 5\' 11"  (1.803 m)   Wt 210 lb (95.3 kg)   LMP 03/01/2020   SpO2 99%   BMI 29.29 kg/m       Objective:   Physical Exam   General Mental Status- Alert. General Appearance- Not in acute distress.   Skin General: Color- Normal Color. Moisture- Normal Moisture.   Chest and Lung Exam Auscultation: Breath Sounds:-Normal.  Cardiovascular Auscultation:Rythm- Regular. Murmurs & Other Heart Sounds:Auscultation of the heart reveals- No Murmurs.  Neurologic Cranial Nerve exam:- CN III-XII intact(No nystagmus), symmetric smile. Strength:- 5/5 equal and symmetric strength both upper and lower extremities.     Assessment & Plan:  Glad to hear doing well with both vyvanse for ADD and buspar for anxiety. Will continue prescribing both.   Refilled vyanse today. You have one more month of buspar.  Keep dermatologist appointment.  Follow up in 4 months controlled med visit  or as needed

## 2020-05-13 ENCOUNTER — Other Ambulatory Visit: Payer: Self-pay

## 2020-05-13 ENCOUNTER — Emergency Department (INDEPENDENT_AMBULATORY_CARE_PROVIDER_SITE_OTHER)
Admission: RE | Admit: 2020-05-13 | Discharge: 2020-05-13 | Disposition: A | Discharge status: Home / Self Care | Payer: 59 | Source: Ambulatory Visit

## 2020-05-13 VITALS — BP 127/89 | HR 72 | Temp 98.5°F | Resp 17 | Ht 71.0 in | Wt 209.0 lb

## 2020-05-13 DIAGNOSIS — M545 Low back pain, unspecified: Secondary | ICD-10-CM

## 2020-05-13 DIAGNOSIS — S39012A Strain of muscle, fascia and tendon of lower back, initial encounter: Secondary | ICD-10-CM

## 2020-05-13 DIAGNOSIS — Z23 Encounter for immunization: Secondary | ICD-10-CM

## 2020-05-13 MED ORDER — METHOCARBAMOL 500 MG PO TABS: 500.0000 mg | ORAL_TABLET | Freq: Two times a day (BID) | ORAL | 0 refills | Status: DC

## 2020-05-13 MED ORDER — INFLUENZA VAC SPLIT QUAD 0.5 ML IM SUSY
0.5000 mL | PREFILLED_SYRINGE | INTRAMUSCULAR | Status: AC
  Administered 2020-05-13: 0.5 mL via INTRAMUSCULAR

## 2020-05-13 NOTE — ED Provider Notes (Signed)
[facility]    CSN: [id number] Arrival date & time: 05/13/20  1839      History   Chief Complaint Chief Complaint  Patient presents with  . Back Pain    Lower    HPI IDELLE Mikayla Hicks is a 34 y.o. female.   HPI Mikayla Hicks is a 34 y.o. female presenting to UC with c/o Left side low back pain that started 4 days ago after lifting weights in the gym. Pt states she felt a "pulling" sensation while lifting weights. Pain was worst the next day. It has gradually improved with ibuprofen 600mg  every 4 hours and ice but states pain is still there.  Last dose at 3PM.  Hx of hemangioma on T6&T7, had surgery when she was about 31UT but no complications since then. Denies radiation of pain or numbness in arms or legs. No bowel or bladder changes. No fever or chills.    Past Medical History:  Diagnosis Date  . Allergy   . Anxiety    just recently and seeing therapist.  . Hemangioma     There are no problems to display for this patient.   Past Surgical History:  Procedure Laterality Date  . BACK SURGERY      OB History   No obstetric history on file.      Home Medications    Prior to Admission medications   Medication Sig Start Date End Date Taking? Authorizing Provider  buPROPion (WELLBUTRIN XL) 300 MG 24 hr tablet Take 1 tablet (300 mg total) by mouth daily. Patient not taking: Reported on 01/13/2020 09/30/19   Saguier, Percell Miller, PA-C  busPIRone (BUSPAR) 7.5 MG tablet Take 1 tablet (7.5 mg total) by mouth 2 (two) times daily. 11/02/19   Saguier, Percell Miller, PA-C  cetirizine (ZYRTEC) 10 MG chewable tablet Chew 10 mg by mouth daily.    [provider]  cyclobenzaprine (FLEXERIL) 10 MG tablet Take 1 tablet (10 mg total) by mouth 2 (two) times daily as needed for muscle spasms. Patient not taking: Reported on 01/13/2020 03/08/19   Sharion Balloon, NP  lisdexamfetamine (VYVANSE) 20 MG capsule Take 1 capsule (20 mg total) by mouth daily. 03/07/20   Saguier, Percell Miller, PA-C    methocarbamol (ROBAXIN) 500 MG tablet Take 1 tablet (500 mg total) by mouth 2 (two) times daily. 05/13/20   Noe Gens, PA-C  naproxen (NAPROSYN) 375 MG tablet Take 1 tablet (375 mg total) by mouth 2 (two) times daily. Patient not taking: Reported on 01/13/2020 03/08/19   Sharion Balloon, NP  Omega-3 Fatty Acids (FISH OIL) 1000 MG CAPS Take by mouth.    [provider]    Family History History reviewed. No pertinent family history.  Social History Social History   Tobacco Use  . Smoking status: Never Smoker  . Smokeless tobacco: Never Used  Substance Use Topics  . Alcohol use: Yes    Comment: rare and if does very little.  . Drug use: No     Allergies   Patient has no known allergies.   Review of Systems Review of Systems  Constitutional: Negative for chills and fever.  Gastrointestinal: Negative for abdominal pain.  Genitourinary: Negative for dysuria, flank pain, frequency and hematuria.  Musculoskeletal: Positive for back pain and myalgias. Negative for neck pain and neck stiffness.     Physical Exam Triage Vital Signs ED Triage Vitals  Enc Vitals Group     BP 05/13/20 1852 127/89     Pulse  Rate 05/13/20 1852 72     Resp 05/13/20 1852 17     Temp 05/13/20 1852 98.5 F (36.9 C)     Temp Source 05/13/20 1852 Oral     SpO2 05/13/20 1852 98 %     Weight 05/13/20 1849 209 lb (94.8 kg)     Height 05/13/20 1849 5\' 11"  (1.803 m)     Head Circumference --      Peak Flow --      Pain Score 05/13/20 1849 7     Pain Loc --      Pain Edu? --      Excl. in [place]? --    No data found.  Updated Vital Signs BP 127/89 (BP Location: Left Arm)   Pulse 72   Temp 98.5 F (36.9 C) (Oral)   Resp 17   Ht 5\' 11"  (1.803 m)   Wt 209 lb (94.8 kg)   LMP 04/26/2020   SpO2 98%   BMI 29.15 kg/m   Visual Acuity Right Eye Distance:   Left Eye Distance:   Bilateral Distance:    Right Eye Near:   Left Eye Near:    Bilateral Near:     Physical Exam Vitals and  nursing note reviewed.  Constitutional:      Appearance: Normal appearance. She is well-developed.  HENT:     Head: Normocephalic and atraumatic.  Cardiovascular:     Rate and Rhythm: Normal rate and regular rhythm.  Pulmonary:     Effort: Pulmonary effort is normal. No respiratory distress.     Breath sounds: Normal breath sounds.  Musculoskeletal:        General: Tenderness present. Normal range of motion.     Cervical back: Normal range of motion.       Back:     Comments: No spinal tenderness. Left lower lumbar muscle tenderness. Increased pain in lumbar muscles with flexion of Left hip. No crepitus or tenderness to hip.  Normal gait.   Skin:    General: Skin is warm and dry.  Neurological:     Mental Status: She is alert and oriented to person, place, and time.  Psychiatric:        Behavior: Behavior normal.      UC Treatments / Results  Labs (all labs ordered are listed, but only abnormal results are displayed) Labs Reviewed - No data to display  EKG   Radiology No results found.  Procedures Procedures (including critical care time)  Medications Ordered in UC Medications  influenza vac split quadrivalent PF (FLUARIX) injection 0.5 mL (0.5 mLs Intramuscular Given 05/13/20 1919)    Initial Impression / Assessment and Plan / UC Course  I have reviewed the triage vital signs and the nursing notes.  Pertinent labs & imaging results that were available during my care of the patient were reviewed by me and considered in my medical decision making (see chart for details).    Hx and exam c/w lumbar strain without red flag symptoms Pt also requesting flu vaccine while here. No URI symptoms, and no known exposure to flu recently  Encouraged f/u with PCP and Sports Medicine AVS given  Final Clinical Impressions(s) / UC Diagnoses   Final diagnoses:  Acute left-sided low back pain without sciatica  Flu vaccine need  Strain of lumbar region, initial encounter      Discharge Instructions      Robaxin (methocarbamol) is a muscle relaxer and may cause drowsiness. Do not drink alcohol, drive, or  operate heavy machinery while taking.  You may take 500mg  acetaminophen every 4-6 hours or in combination with ibuprofen 400-600mg  every 6-8 hours as needed for pain and inflammation.  Call to schedule a follow up appointment with Sports Medicine or family medicine next week if not improving.     ED Prescriptions    Medication Sig Dispense Auth. Provider   methocarbamol (ROBAXIN) 500 MG tablet Take 1 tablet (500 mg total) by mouth 2 (two) times daily. 20 tablet Noe Gens, [place]     I have reviewed the PDMP during this encounter.   Noe Gens, [place] 05/13/20 1924

## 2020-05-13 NOTE — Discharge Instructions (Signed)
  Robaxin (methocarbamol) is a muscle relaxer and may cause drowsiness. Do not drink alcohol, drive, or operate heavy machinery while taking.  You may take 500mg  acetaminophen every 4-6 hours or in combination with ibuprofen 400-600mg  every 6-8 hours as needed for pain and inflammation.  Call to schedule a follow up appointment with Sports Medicine or family medicine next week if not improving.

## 2020-05-13 NOTE — ED Triage Notes (Signed)
Pt c/o lower back pain x 4 days. Says she was working out at gym and felt a Curator feeling while lifting weights. Pain 7/10 Heat/ice and ibuprofen prn, last dose 3pm. Hx of hemangioma on t6&t7, has had partial removal.

## 2020-05-19 DIAGNOSIS — M9901 Segmental and somatic dysfunction of cervical region: Secondary | ICD-10-CM | POA: Diagnosis not present

## 2020-05-19 DIAGNOSIS — M9904 Segmental and somatic dysfunction of sacral region: Secondary | ICD-10-CM | POA: Diagnosis not present

## 2020-05-19 DIAGNOSIS — M9903 Segmental and somatic dysfunction of lumbar region: Secondary | ICD-10-CM | POA: Diagnosis not present

## 2020-05-19 DIAGNOSIS — M5442 Lumbago with sciatica, left side: Secondary | ICD-10-CM | POA: Diagnosis not present

## 2020-05-19 DIAGNOSIS — M4728 Other spondylosis with radiculopathy, sacral and sacrococcygeal region: Secondary | ICD-10-CM | POA: Diagnosis not present

## 2020-05-19 DIAGNOSIS — M530 Cervicocranial syndrome: Secondary | ICD-10-CM | POA: Diagnosis not present

## 2020-05-23 DIAGNOSIS — Z20822 Contact with and (suspected) exposure to covid-19: Secondary | ICD-10-CM | POA: Diagnosis not present

## 2020-05-25 DIAGNOSIS — M9903 Segmental and somatic dysfunction of lumbar region: Secondary | ICD-10-CM | POA: Diagnosis not present

## 2020-05-25 DIAGNOSIS — M4728 Other spondylosis with radiculopathy, sacral and sacrococcygeal region: Secondary | ICD-10-CM | POA: Diagnosis not present

## 2020-05-25 DIAGNOSIS — M9901 Segmental and somatic dysfunction of cervical region: Secondary | ICD-10-CM | POA: Diagnosis not present

## 2020-05-25 DIAGNOSIS — M9904 Segmental and somatic dysfunction of sacral region: Secondary | ICD-10-CM | POA: Diagnosis not present

## 2020-05-25 DIAGNOSIS — M5442 Lumbago with sciatica, left side: Secondary | ICD-10-CM | POA: Diagnosis not present

## 2020-05-25 DIAGNOSIS — M530 Cervicocranial syndrome: Secondary | ICD-10-CM | POA: Diagnosis not present

## 2020-06-02 ENCOUNTER — Ambulatory Visit: Payer: Self-pay

## 2020-06-02 ENCOUNTER — Emergency Department
Admission: EM | Admit: 2020-06-02 | Discharge: 2020-06-02 | Disposition: A | Discharge status: Home / Self Care | Payer: 59 | Source: Home / Self Care | Attending: Family Medicine | Admitting: Family Medicine

## 2020-06-02 ENCOUNTER — Other Ambulatory Visit: Payer: Self-pay

## 2020-06-02 ENCOUNTER — Emergency Department: Admit: 2020-06-02 | Discharge status: Absent without leave | Payer: Self-pay

## 2020-06-02 DIAGNOSIS — G43809 Other migraine, not intractable, without status migrainosus: Secondary | ICD-10-CM

## 2020-06-02 DIAGNOSIS — H66002 Acute suppurative otitis media without spontaneous rupture of ear drum, left ear: Secondary | ICD-10-CM | POA: Diagnosis not present

## 2020-06-02 HISTORY — DX: Migraine, unspecified, not intractable, without status migrainosus: G43.909

## 2020-06-02 MED ORDER — AMOXICILLIN 875 MG PO TABS: 875.0000 mg | ORAL_TABLET | Freq: Two times a day (BID) | ORAL | 0 refills | Status: DC

## 2020-06-02 MED ORDER — KETOROLAC TROMETHAMINE 60 MG/2ML IM SOLN
60.0000 mg | Freq: Once | INTRAMUSCULAR | Status: AC
  Administered 2020-06-02: 60 mg via INTRAMUSCULAR

## 2020-06-02 NOTE — ED Notes (Signed)
Informed by coworker that pt tested positive for COVID on 05/26/20.

## 2020-06-02 NOTE — ED Provider Notes (Signed)
[facility]   [id number] 06/02/20 Arrival Time: 1329  ASSESSMENT & PLAN:  1. Non-recurrent acute suppurative otitis media of left ear without spontaneous rupture of tympanic membrane   2. Other migraine without status migrainosus, not intractable     Meds ordered this encounter  Medications  . ketorolac (TORADOL) injection 60 mg  . amoxicillin (AMOXIL) 875 MG tablet    Sig: Take 1 tablet (875 mg total) by mouth 2 (two) times daily for 10 days.    Dispense:  20 tablet    Refill:  0    OTC symptom care as needed. Ensure adequate fluid intake and rest.    Follow-up Information    Saguier, Kateri Mc.   Specialties: Internal Medicine, Family Medicine Why: As needed. Contact information: [address] [place] [address] [phone]              .  Reviewed expectations re: course of current medical issues. Questions answered. Outlined signs and symptoms indicating need for more acute intervention. Patient verbalized understanding. After Visit Summary given.   SUBJECTIVE: History from: patient.  Mikayla Hicks is a 34 y.o. female who presents with complaint of left otalgia; without drainage; without bleeding. Onset abrupt, today. Recent cold symptoms: congestion; + COVID. Fever: no. Overall normal PO intake without n/v.  OTC treatment: none. Also reports migraine HA; frontal; 3 days; without n/v; mild photophobia. No vision changes. No extremity sensation changes or weakness.   Social History   Tobacco Use  Smoking Status Never Smoker  Smokeless Tobacco Never Used      OBJECTIVE:  Vitals:   06/02/20 1345 06/02/20 1353  BP:  127/90  Pulse:  82  Resp:  18  Temp:  98.1 F (36.7 C)  TempSrc:  Oral  SpO2:  98%  Weight: 93 kg   Height: 5\' 11"  (1.803 m)      General appearance: alert; appears fatigue Eyes: PERRLA; EOMI Ear Canal: normal TM: left: erythematous, dull, bulging Neck: supple without LAD Lungs:  unlabored respirations, symmetrical air entry; cough: absent; no respiratory distress Skin: warm and dry Neuro: CN 2-12 grossly intact Psychological: alert and cooperative; normal mood and affect  No Known Allergies  Past Medical History:  Diagnosis Date  . Allergy   . Anxiety    just recently and seeing therapist.  . Hemangioma   . Migraines    Family History  Problem Relation Age of Onset  . Healthy Mother   . Healthy Father    Social History   Socioeconomic History  . Marital status: Divorced    Spouse name: Not on file  . Number of children: Not on file  . Years of education: Not on file  . Highest education level: Not on file  Occupational History  . Not on file  Tobacco Use  . Smoking status: Never Smoker  . Smokeless tobacco: Never Used  Vaping Use  . Vaping Use: Never used  Substance and Sexual Activity  . Alcohol use: Yes    Comment: rare and if does very little.  . Drug use: No  . Sexual activity: Yes    Birth control/protection: None  Other Topics Concern  . Not on file  Social History Narrative  . Not on file   Social Determinants of Health   Financial Resource Strain: Not on file  Food Insecurity: Not on file  Transportation Needs: Not on file  Physical Activity: Not on file  Stress: Not on file  Social Connections: Not on file  Intimate Partner Violence: Not on file            Vanessa Kick, MD 06/02/20 [phone]

## 2020-06-02 NOTE — Discharge Instructions (Addendum)
Meds ordered this encounter  Medications   ketorolac (TORADOL) injection 60 mg   amoxicillin (AMOXIL) 875 MG tablet    Sig: Take 1 tablet (875 mg total) by mouth 2 (two) times daily for 10 days.    Dispense:  20 tablet    Refill:  0

## 2020-06-02 NOTE — ED Triage Notes (Signed)
Pt presents to Urgent Care with c/o L earache which awakened her this AM. She reports that her hearing is "muffled." Also reports she has had a headache/"migraine" x approx 3 days.

## 2020-06-02 NOTE — ED Notes (Signed)
Pt w/o c/o following IM Toradol, no s/s of distress.

## 2020-06-08 ENCOUNTER — Encounter: Payer: Self-pay | Admitting: Medical

## 2020-06-09 ENCOUNTER — Other Ambulatory Visit: Payer: Self-pay | Admitting: Medical

## 2020-06-09 ENCOUNTER — Telehealth: Payer: Self-pay | Admitting: Medical

## 2020-06-09 MED ORDER — AMOXICILLIN-POT CLAVULANATE 875-125 MG PO TABS: 1.0000 | ORAL_TABLET | Freq: Two times a day (BID) | ORAL | 0 refills | Status: DC

## 2020-06-09 MED FILL — AMOX-CLAV 875-125 MG TABLET: 875-125 | 10 days supply | Qty: 20 | Fill #0

## 2020-06-09 NOTE — Telephone Encounter (Signed)
Rx augmentin sent to pt pharmacy. 

## 2020-06-30 ENCOUNTER — Encounter: Payer: Self-pay | Admitting: Medical

## 2020-06-30 ENCOUNTER — Other Ambulatory Visit: Payer: Self-pay | Admitting: Medical

## 2020-06-30 ENCOUNTER — Telehealth: Payer: Self-pay | Admitting: Medical

## 2020-06-30 MED ORDER — LISDEXAMFETAMINE DIMESYLATE 20 MG PO CAPS: 20.0000 mg | ORAL_CAPSULE | Freq: Every day | ORAL | 0 refills | Status: DC

## 2020-06-30 MED FILL — VYVANSE 20 MG CAPSULE: 20 | 30 days supply | Qty: 30 | Fill #0

## 2020-06-30 NOTE — Telephone Encounter (Signed)
Requesting: Vyvanse 20mg  Contract: 02/04/2020 UDS: 01/28/2020 Last Visit: 03/07/2020 Next Visit: None Last Refill: 03/07/2020 #30 and 0RF   Please Advise

## 2020-06-30 NOTE — Telephone Encounter (Signed)
Appointment made

## 2020-06-30 NOTE — Telephone Encounter (Signed)
Please get pt scheduled for office visit in February. Sent meds to pharmacy.

## 2020-06-30 NOTE — Telephone Encounter (Signed)
Refill sent to pharmacy. Pt needs to be seen in February for controlled med visit. Please make sure scheduled.

## 2020-07-25 ENCOUNTER — Ambulatory Visit: Payer: 59 | Admitting: Medical

## 2020-07-25 DIAGNOSIS — Z0289 Encounter for other administrative examinations: Secondary | ICD-10-CM

## 2020-07-25 DIAGNOSIS — B373 Candidiasis of vulva and vagina: Secondary | ICD-10-CM | POA: Diagnosis not present

## 2020-07-25 DIAGNOSIS — N898 Other specified noninflammatory disorders of vagina: Secondary | ICD-10-CM | POA: Diagnosis not present

## 2020-07-25 DIAGNOSIS — Z3689 Encounter for other specified antenatal screening: Secondary | ICD-10-CM | POA: Diagnosis not present

## 2020-07-25 DIAGNOSIS — Z3200 Encounter for pregnancy test, result unknown: Secondary | ICD-10-CM | POA: Diagnosis not present

## 2020-07-31 ENCOUNTER — Emergency Department (HOSPITAL_BASED_OUTPATIENT_CLINIC_OR_DEPARTMENT_OTHER): Payer: 59

## 2020-07-31 ENCOUNTER — Emergency Department (HOSPITAL_BASED_OUTPATIENT_CLINIC_OR_DEPARTMENT_OTHER)
Admission: EM | Admit: 2020-07-31 | Discharge: 2020-07-31 | Disposition: A | Discharge status: Home / Self Care | Payer: 59 | Attending: Emergency Medicine | Admitting: Emergency Medicine

## 2020-07-31 ENCOUNTER — Other Ambulatory Visit: Payer: Self-pay

## 2020-07-31 ENCOUNTER — Encounter (HOSPITAL_BASED_OUTPATIENT_CLINIC_OR_DEPARTMENT_OTHER): Payer: Self-pay | Admitting: Emergency Medicine

## 2020-07-31 DIAGNOSIS — Z3A01 Less than 8 weeks gestation of pregnancy: Secondary | ICD-10-CM | POA: Diagnosis not present

## 2020-07-31 DIAGNOSIS — N939 Abnormal uterine and vaginal bleeding, unspecified: Secondary | ICD-10-CM

## 2020-07-31 DIAGNOSIS — O209 Hemorrhage in early pregnancy, unspecified: Secondary | ICD-10-CM | POA: Diagnosis not present

## 2020-07-31 DIAGNOSIS — O200 Threatened abortion: Secondary | ICD-10-CM | POA: Diagnosis not present

## 2020-07-31 DIAGNOSIS — O26851 Spotting complicating pregnancy, first trimester: Secondary | ICD-10-CM | POA: Diagnosis present

## 2020-07-31 LAB — BASIC METABOLIC PANEL
Anion gap: 5 (ref 5–15)
BUN: 11 mg/dL (ref 6–20)
CO2: 26 mmol/L (ref 22–32)
Calcium: 9.1 mg/dL (ref 8.9–10.3)
Chloride: 104 mmol/L (ref 98–111)
Creatinine, Ser: 0.72 mg/dL (ref 0.44–1.00)
GFR, Estimated: >60 mL/min (ref >60)
Glucose, Bld: 93 mg/dL (ref 70–99)
Potassium: 4.3 mmol/L (ref 3.5–5.1)
Sodium: 135 mmol/L (ref 135–145)

## 2020-07-31 LAB — CBC WITH DIFFERENTIAL/PLATELET
Abs Immature Granulocytes: 0.08 10*3/uL — ABNORMAL HIGH (ref 0.00–0.07)
Basophils Absolute: 0 10*3/uL (ref 0.0–0.1)
Basophils Relative: 1 %
Eosinophils Absolute: 0.1 10*3/uL (ref 0.0–0.5)
Eosinophils Relative: 2 %
HCT: 42.2 % (ref 36.0–46.0)
Hemoglobin: 13.9 g/dL (ref 12.0–15.0)
Immature Granulocytes: 1 %
Lymphocytes Relative: 28 %
Lymphs Abs: 2.2 10*3/uL (ref 0.7–4.0)
MCH: 28.3 pg (ref 26.0–34.0)
MCHC: 32.9 g/dL (ref 30.0–36.0)
MCV: 85.8 fL (ref 80.0–100.0)
Monocytes Absolute: 0.6 10*3/uL (ref 0.1–1.0)
Monocytes Relative: 7 %
Neutro Abs: 4.9 10*3/uL (ref 1.7–7.7)
Neutrophils Relative %: 61 %
Platelets: 262 10*3/uL (ref 150–400)
RBC: 4.92 MIL/uL (ref 3.87–5.11)
RDW: 12.7 % (ref 11.5–15.5)
WBC: 7.9 10*3/uL (ref 4.0–10.5)
nRBC: 0 % (ref 0.0–0.2)

## 2020-07-31 LAB — ABO/RH: ABO/RH(D): A POS

## 2020-07-31 LAB — HCG, QUANTITATIVE, PREGNANCY: hCG, Beta Chain, Quant, S: 17935 m[IU]/mL — ABNORMAL HIGH (ref <5)

## 2020-07-31 NOTE — ED Triage Notes (Signed)
Vaginal bleeding and cramping this morning. [redacted] weeks pregnant. Concerned about miscarriage.

## 2020-07-31 NOTE — Discharge Instructions (Addendum)
You were seen in the emergency department for vaginal bleeding in early pregnancy. Your blood work was unremarkable and your ultrasound showed an intrauterine pregnancy at 73 weeks 27 days of age. We are recommending pelvic rest, and close follow-up with your OB doctor. Return if any worsening or concerning symptoms.

## 2020-07-31 NOTE — ED Provider Notes (Signed)
[facility] DEPART[facility] Provider Note   CSN: [id number] Arrival date & time: 07/31/20  4010     History Chief Complaint  Patient presents with  . Vaginal Bleeding    [redacted] weeks pregnant    Mikayla Hicks is a 35 y.o. female.  She is approximately [redacted] weeks pregnant by dates.  She follows with [facility].  G1, P0.  She has had some spotting during her early pregnancy but no other complications.  This morning she had low abdominal cramping pain and vaginal bleeding, menses level of bleeding and one clot the size of a pea.  No nausea vomiting shortness of breath fevers chills or urinary symptoms.  No trauma.  The history is provided by the patient.  Vaginal Bleeding Quality:  Dark red Severity:  Moderate Onset quality:  Sudden Duration:  1 hour Progression:  Unchanged Chronicity:  New Possible pregnancy: yes   Context: spontaneously   Relieved by:  None tried Worsened by:  Nothing Ineffective treatments:  None tried Associated symptoms: abdominal pain   Associated symptoms: no back pain, no dysuria, no fever and no nausea        Past Medical History:  Diagnosis Date  . Allergy   . Anxiety    just recently and seeing therapist.  . Hemangioma   . Migraines     There are no problems to display for this patient.   Past Surgical History:  Procedure Laterality Date  . BACK SURGERY       OB History    Gravida  1   Para      Term      Preterm      AB      Living        SAB      IAB      Ectopic      Multiple      Live Births              Family History  Problem Relation Age of Onset  . Healthy Mother   . Healthy Father     Social History   Tobacco Use  . Smoking status: Never Smoker  . Smokeless tobacco: Never Used  Vaping Use  . Vaping Use: Never used  Substance Use Topics  . Alcohol use: Yes    Comment: rare and if does very little.  . Drug use: No    Home Medications Prior to Admission medications    Medication Sig Start Date End Date Taking? Authorizing Provider  amoxicillin-clavulanate (AUGMENTIN) 875-125 MG tablet Take 1 tablet by mouth 2 (two) times daily. 06/09/20   Saguier, Percell Miller, PA-C  b complex vitamins capsule Take 1 capsule by mouth daily.    [provider]  busPIRone (BUSPAR) 7.5 MG tablet Take 1 tablet (7.5 mg total) by mouth 2 (two) times daily. Patient taking differently: Take 7.5 mg by mouth daily as needed. 11/02/19   Saguier, Percell Miller, PA-C  cetirizine (ZYRTEC) 10 MG chewable tablet Chew 10 mg by mouth daily.    [provider]  cholecalciferol (VITAMIN D3) 25 MCG (1000 UNIT) tablet Take 1,000 Units by mouth daily.    [provider]  lisdexamfetamine (VYVANSE) 20 MG capsule Take 1 capsule (20 mg total) by mouth daily. 06/30/20   Saguier, Percell Miller, PA-C  Omega-3 Fatty Acids (FISH OIL) 1000 MG CAPS Take by mouth.    [provider]  buPROPion (WELLBUTRIN XL) 300 MG 24 hr tablet Take 1 tablet (300  mg total) by mouth daily. Patient not taking: No sig reported 09/30/19 06/02/20  Saguier, Percell Miller, PA-C    Allergies    Patient has no known allergies.  Review of Systems   Review of Systems  Constitutional: Negative for fever.  HENT: Negative for sore throat.   Eyes: Negative for visual disturbance.  Respiratory: Negative for shortness of breath.   Cardiovascular: Negative for chest pain.  Gastrointestinal: Positive for abdominal pain. Negative for nausea.  Genitourinary: Positive for vaginal bleeding. Negative for dysuria.  Musculoskeletal: Negative for back pain.  Skin: Negative for rash.  Neurological: Negative for headaches.    Physical Exam Updated Vital Signs BP 118/70 (BP Location: Left Arm)   Pulse 81   Temp 98.6 F (37 C) (Oral)   Resp 18   Ht 5\' 10"  (1.778 m)   Wt 98.9 kg   LMP 04/26/2020   SpO2 100%   BMI 31.28 kg/m   Physical Exam Vitals and nursing note reviewed.  Constitutional:      General: She is not in acute  distress.    Appearance: Normal appearance. She is well-developed and well-nourished.  HENT:     Head: Normocephalic and atraumatic.  Eyes:     Conjunctiva/sclera: Conjunctivae normal.  Cardiovascular:     Rate and Rhythm: Normal rate and regular rhythm.     Heart sounds: No murmur heard.   Pulmonary:     Effort: Pulmonary effort is normal. No respiratory distress.     Breath sounds: Normal breath sounds.  Abdominal:     Palpations: Abdomen is soft.     Tenderness: There is no abdominal tenderness. There is no guarding or rebound.  Musculoskeletal:        General: No deformity, signs of injury or edema. Normal range of motion.     Cervical back: Neck supple.  Skin:    General: Skin is warm and dry.  Neurological:     General: No focal deficit present.     Mental Status: She is alert.  Psychiatric:        Mood and Affect: Mood and affect normal.     ED Results / Procedures / Treatments   Labs (all labs ordered are listed, but only abnormal results are displayed) Labs Reviewed  CBC WITH DIFFERENTIAL/PLATELET - Abnormal; Notable for the following components:      Result Value   Abs Immature Granulocytes 0.08 (*)    All other components within normal limits  HCG, QUANTITATIVE, PREGNANCY - Abnormal; Notable for the following components:   hCG, Beta Chain, Quant, S 17,935 (*)    All other components within normal limits  BASIC METABOLIC PANEL  ABO/RH    EKG None  Radiology US OB LESS THAN 14 WEEKS WITH OB TRANSVAGINAL  Result Date: 07/31/2020 CLINICAL DATA:  Vaginal bleeding and pelvic cramping in early pregnancy EXAM: OBSTETRIC <14 WK [place] AND TRANSVAGINAL OB US TECHNIQUE: Both transabdominal and transvaginal ultrasound examinations were performed for complete evaluation of the gestation as well as the maternal uterus, adnexal regions, and pelvic cul-de-sac. Transvaginal technique was performed to assess early pregnancy. COMPARISON:  None. FINDINGS: Intrauterine  gestational sac: Single Yolk sac:  Visualized. Embryo:  Visualized. Cardiac Activity: Visualized. Heart Rate: 168 bpm MSD:   mm    w     d CRL:  3.1 mm   5 w   6 d                  [place] EDC: 03/27/2021 Maternal  uterus/adnexae: Subchorionic hemorrhage: None Right ovary: Right ovary corpus luteum noted. 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 Free fluid:  Trace free fluid within the pelvis. IMPRESSION: 1. Single living intrauterine gestation is identified with estimated gestational age of [redacted] weeks and 6 days. 2. Suggestion of a bicornuate uterus. If further imaging is clinically indicated MRI of the pelvis may provide a more definitive characterization of suspected mullerian duct anomaly. Electronically Signed   By: Kerby Moors M.D.   On: 07/31/2020 11:22    Procedures Procedures   Medications Ordered in ED Medications - No data to display  ED Course  I have reviewed the triage vital signs and the nursing notes.  Pertinent labs & imaging results that were available during my care of the patient were reviewed by me and considered in my medical decision making (see chart for details).  Clinical Course as of 07/31/20 1730  Sun Jul 31, 2020  1126 Ultrasound showing single live IUP with a heart rate of 168, dates corresponding to 5 weeks 6 days. [MB]  1209 Pelvic exam done with tech as chaperone.  No active bleeding.  Some red-brown old blood in vault. [MB]  19 Discussed with Dr. Lanny Cramp OB/GYN who agrees with current work-up and recommends following up in the outpatient office. Reviewed this with patient and her significant other and they are comfortable plan for discharge. Return instructions discussed [MB]    Clinical Course User Index [MB] Hayden Rasmussen, MD   MDM Rules/Calculators/A&P                         This patient complains of vaginal bleeding first trimester pregnancy; this involves an extensive number of treatment Options  and is a complaint that carries with it a high risk of complications and Morbidity. The differential includes miscarriage, threatened miscarriage, subchorionic hemorrhage  I ordered, reviewed and interpreted labs, which included CBC with normal white count normal hemoglobin, chemistries normal, beta-hCG appropriate for dates, a positive blood type I ordered imaging studies which included pelvic ultrasound and I independently    visualized and interpreted imaging which showed intrauterine pregnancy at appropriate dates with heart rate of 168 Additional history obtained from patient significant other Previous records obtained and reviewed in epic, no recent admissions I consulted Dr. Lanny Cramp OB/GYN for [facility] group and discussed lab and imaging findings  Critical Interventions: None  After the interventions stated above, I reevaluated the patient and found patient's bleeding to have slowed.  Reviewed work-up with her and precautions for home.  Return instructions discussed.   Final Clinical Impression(s) / ED Diagnoses Final diagnoses:  Threatened miscarriage    Rx / DC Orders ED Discharge Orders    None       Hayden Rasmussen, MD 07/31/20 [phone]

## 2020-07-31 NOTE — ED Notes (Signed)
Pt out of room for testing. 

## 2020-08-03 DIAGNOSIS — O209 Hemorrhage in early pregnancy, unspecified: Secondary | ICD-10-CM | POA: Diagnosis not present

## 2020-08-16 DIAGNOSIS — Z3201 Encounter for pregnancy test, result positive: Secondary | ICD-10-CM | POA: Diagnosis not present

## 2020-09-01 ENCOUNTER — Other Ambulatory Visit: Payer: Self-pay | Admitting: Obstetrics and Gynecology

## 2020-09-01 DIAGNOSIS — Z3689 Encounter for other specified antenatal screening: Secondary | ICD-10-CM | POA: Diagnosis not present

## 2020-09-01 DIAGNOSIS — Z36 Encounter for antenatal screening for chromosomal anomalies: Secondary | ICD-10-CM | POA: Diagnosis not present

## 2020-09-01 DIAGNOSIS — Z3401 Encounter for supervision of normal first pregnancy, first trimester: Secondary | ICD-10-CM | POA: Diagnosis not present

## 2020-09-01 DIAGNOSIS — Z124 Encounter for screening for malignant neoplasm of cervix: Secondary | ICD-10-CM | POA: Diagnosis not present

## 2020-09-01 DIAGNOSIS — Z3A10 10 weeks gestation of pregnancy: Secondary | ICD-10-CM | POA: Diagnosis not present

## 2020-09-01 LAB — HM PAP SMEAR: HPV, high-risk: NEGATIVE

## 2020-09-01 LAB — OB RESULTS CONSOLE RPR: RPR: NONREACTIVE

## 2020-09-01 LAB — OB RESULTS CONSOLE HEPATITIS B SURFACE ANTIGEN: Hepatitis B Surface Ag: NEGATIVE

## 2020-09-01 LAB — OB RESULTS CONSOLE HIV ANTIBODY (ROUTINE TESTING): HIV: NONREACTIVE

## 2020-09-01 LAB — OB RESULTS CONSOLE RUBELLA ANTIBODY, IGM: Rubella: IMMUNE

## 2020-09-14 DIAGNOSIS — M5442 Lumbago with sciatica, left side: Secondary | ICD-10-CM | POA: Diagnosis not present

## 2020-09-14 DIAGNOSIS — M9904 Segmental and somatic dysfunction of sacral region: Secondary | ICD-10-CM | POA: Diagnosis not present

## 2020-09-14 DIAGNOSIS — M9901 Segmental and somatic dysfunction of cervical region: Secondary | ICD-10-CM | POA: Diagnosis not present

## 2020-09-14 DIAGNOSIS — M4728 Other spondylosis with radiculopathy, sacral and sacrococcygeal region: Secondary | ICD-10-CM | POA: Diagnosis not present

## 2020-09-14 DIAGNOSIS — M530 Cervicocranial syndrome: Secondary | ICD-10-CM | POA: Diagnosis not present

## 2020-09-14 DIAGNOSIS — M9903 Segmental and somatic dysfunction of lumbar region: Secondary | ICD-10-CM | POA: Diagnosis not present

## 2020-09-15 DIAGNOSIS — M9901 Segmental and somatic dysfunction of cervical region: Secondary | ICD-10-CM | POA: Diagnosis not present

## 2020-09-15 DIAGNOSIS — M9904 Segmental and somatic dysfunction of sacral region: Secondary | ICD-10-CM | POA: Diagnosis not present

## 2020-09-15 DIAGNOSIS — M5442 Lumbago with sciatica, left side: Secondary | ICD-10-CM | POA: Diagnosis not present

## 2020-09-15 DIAGNOSIS — M4728 Other spondylosis with radiculopathy, sacral and sacrococcygeal region: Secondary | ICD-10-CM | POA: Diagnosis not present

## 2020-09-15 DIAGNOSIS — M530 Cervicocranial syndrome: Secondary | ICD-10-CM | POA: Diagnosis not present

## 2020-09-15 DIAGNOSIS — M9903 Segmental and somatic dysfunction of lumbar region: Secondary | ICD-10-CM | POA: Diagnosis not present

## 2020-09-18 ENCOUNTER — Inpatient Hospital Stay (HOSPITAL_BASED_OUTPATIENT_CLINIC_OR_DEPARTMENT_OTHER)
Admission: EM | Admit: 2020-09-18 | Discharge: 2020-09-18 | Disposition: A | Discharge status: Home / Self Care | Payer: 59 | Attending: Obstetrics and Gynecology | Admitting: Obstetrics and Gynecology

## 2020-09-18 ENCOUNTER — Inpatient Hospital Stay (HOSPITAL_COMMUNITY): Payer: 59

## 2020-09-18 ENCOUNTER — Encounter (HOSPITAL_BASED_OUTPATIENT_CLINIC_OR_DEPARTMENT_OTHER): Payer: Self-pay

## 2020-09-18 ENCOUNTER — Emergency Department (HOSPITAL_BASED_OUTPATIENT_CLINIC_OR_DEPARTMENT_OTHER): Payer: 59

## 2020-09-18 ENCOUNTER — Other Ambulatory Visit: Payer: Self-pay

## 2020-09-18 DIAGNOSIS — Z3A13 13 weeks gestation of pregnancy: Secondary | ICD-10-CM | POA: Insufficient documentation

## 2020-09-18 DIAGNOSIS — O26891 Other specified pregnancy related conditions, first trimester: Secondary | ICD-10-CM | POA: Diagnosis not present

## 2020-09-18 DIAGNOSIS — Z3A12 12 weeks gestation of pregnancy: Secondary | ICD-10-CM | POA: Diagnosis not present

## 2020-09-18 DIAGNOSIS — Z79899 Other long term (current) drug therapy: Secondary | ICD-10-CM | POA: Diagnosis not present

## 2020-09-18 DIAGNOSIS — R109 Unspecified abdominal pain: Secondary | ICD-10-CM | POA: Insufficient documentation

## 2020-09-18 DIAGNOSIS — O209 Hemorrhage in early pregnancy, unspecified: Secondary | ICD-10-CM | POA: Diagnosis not present

## 2020-09-18 DIAGNOSIS — O468X1 Other antepartum hemorrhage, first trimester: Secondary | ICD-10-CM

## 2020-09-18 DIAGNOSIS — O208 Other hemorrhage in early pregnancy: Secondary | ICD-10-CM | POA: Diagnosis not present

## 2020-09-18 DIAGNOSIS — O418X10 Other specified disorders of amniotic fluid and membranes, first trimester, not applicable or unspecified: Secondary | ICD-10-CM

## 2020-09-18 DIAGNOSIS — O4690 Antepartum hemorrhage, unspecified, unspecified trimester: Secondary | ICD-10-CM | POA: Diagnosis present

## 2020-09-18 LAB — COMPREHENSIVE METABOLIC PANEL
ALT: 13 U/L (ref 0–44)
AST: 15 U/L (ref 15–41)
Albumin: 3.5 g/dL (ref 3.5–5.0)
Alkaline Phosphatase: 34 U/L — ABNORMAL LOW (ref 38–126)
Anion gap: 7 (ref 5–15)
BUN: 11 mg/dL (ref 6–20)
CO2: 25 mmol/L (ref 22–32)
Calcium: 8.8 mg/dL — ABNORMAL LOW (ref 8.9–10.3)
Chloride: 103 mmol/L (ref 98–111)
Creatinine, Ser: 0.73 mg/dL (ref 0.44–1.00)
GFR, Estimated: >60 mL/min (ref >60)
Glucose, Bld: 90 mg/dL (ref 70–99)
Potassium: 3.9 mmol/L (ref 3.5–5.1)
Sodium: 135 mmol/L (ref 135–145)
Total Bilirubin: 0.2 mg/dL — ABNORMAL LOW (ref 0.3–1.2)
Total Protein: 6.5 g/dL (ref 6.5–8.1)

## 2020-09-18 LAB — CBC
HCT: 40.6 % (ref 36.0–46.0)
Hemoglobin: 13.7 g/dL (ref 12.0–15.0)
MCH: 28.7 pg (ref 26.0–34.0)
MCHC: 33.7 g/dL (ref 30.0–36.0)
MCV: 85.1 fL (ref 80.0–100.0)
Platelets: 254 10*3/uL (ref 150–400)
RBC: 4.77 MIL/uL (ref 3.87–5.11)
RDW: 12.2 % (ref 11.5–15.5)
WBC: 13.2 10*3/uL — ABNORMAL HIGH (ref 4.0–10.5)
nRBC: 0 % (ref 0.0–0.2)

## 2020-09-18 LAB — PROTIME-INR
INR: 1 (ref 0.8–1.2)
Prothrombin Time: 12.3 seconds (ref 11.4–15.2)

## 2020-09-18 LAB — LIPASE, BLOOD: Lipase: 28 U/L (ref 11–51)

## 2020-09-18 LAB — APTT: aPTT: 25 seconds (ref 24–36)

## 2020-09-18 LAB — HCG, QUANTITATIVE, PREGNANCY: hCG, Beta Chain, Quant, S: 113654 m[IU]/mL — ABNORMAL HIGH (ref <5)

## 2020-09-18 NOTE — ED Provider Notes (Signed)
[facility] EMERGENCY DEPARTMENT Provider Note   CSN: [id number] Arrival date & time: 09/18/20  8250     History Chief Complaint  Patient presents with  . Vaginal Bleeding    Mikayla Hicks is a 35 y.o. female G1 P0 that presents the emerge department today for vaginal bleeding.  Patient is about [redacted] weeks pregnant by dates.  She follows with [facility], Dr. Lanny Cramp.  Patient states that she has had some vaginal bleeding with this pregnancy, however on Tuesday she started having bright red blood, saw her OB Dr. Lanny Cramp, patient had normal fetal tones at that time with closed cervix.  Patient states that bleeding had stopped by Thursday.  Then this morning she started having bright red blood, passed 2 large golf sizable clots in the shower.  States that she is still continuing to bleed.  Also admitting to some lower abdominal pain, states this is a 3 out of 10.  Does not radiate anywhere.  Denies any pelvic pain.  Denies any dysuria or hematuria.  Denies any abnormal bowel movements.  Denies any fevers or chills.  Patient states that she was in her normal health before this.  No chest pain or shortness of breath.  Denies any trauma to her abdomen.  No other complaints at this time.  HPI     Past Medical History:  Diagnosis Date  . Allergy   . Anxiety    just recently and seeing therapist.  . Hemangioma   . Migraines     There are no problems to display for this patient.   Past Surgical History:  Procedure Laterality Date  . BACK SURGERY       OB History    Gravida  1   Para      Term      Preterm      AB      Living        SAB      IAB      Ectopic      Multiple      Live Births              Family History  Problem Relation Age of Onset  . Healthy Mother   . Healthy Father     Social History   Tobacco Use  . Smoking status: Never Smoker  . Smokeless tobacco: Never Used  Vaping Use  . Vaping Use: Never used  Substance Use Topics  .  Alcohol use: Yes    Comment: rare and if does very little.  . Drug use: No    Home Medications Prior to Admission medications   Medication Sig Start Date End Date Taking? Authorizing Provider  amoxicillin-clavulanate (AUGMENTIN) 875-125 MG tablet TAKE 1 TABLET BY MOUTH 2 (TWO) TIMES DAILY. 06/09/20 06/09/21  Saguier, Percell Miller, PA-C  b complex vitamins capsule Take 1 capsule by mouth daily.    [provider]  busPIRone (BUSPAR) 7.5 MG tablet Take 1 tablet (7.5 mg total) by mouth 2 (two) times daily. Patient taking differently: Take 7.5 mg by mouth daily as needed. 11/02/19   Saguier, Percell Miller, PA-C  cetirizine (ZYRTEC) 10 MG chewable tablet Chew 10 mg by mouth daily.    [provider]  cholecalciferol (VITAMIN D3) 25 MCG (1000 UNIT) tablet Take 1,000 Units by mouth daily.    [provider]  lisdexamfetamine (VYVANSE) 20 MG capsule TAKE 1 CAPSULE (20 MG TOTAL) BY MOUTH DAILY. 06/30/20 12/27/20  Saguier, Percell Miller, PA-C  Omega-3 Fatty Acids (FISH OIL) 1000 MG CAPS Take by mouth.    [provider]  buPROPion (WELLBUTRIN XL) 300 MG 24 hr tablet Take 1 tablet (300 mg total) by mouth daily. Patient not taking: No sig reported 09/30/19 06/02/20  Saguier, Percell Miller, PA-C    Allergies    Patient has no known allergies.  Review of Systems   Review of Systems  Constitutional: Negative for chills, diaphoresis, fatigue and fever.  HENT: Negative for congestion, sore throat and trouble swallowing.   Eyes: Negative for pain and visual disturbance.  Respiratory: Negative for cough, shortness of breath and wheezing.   Cardiovascular: Negative for chest pain, palpitations and leg swelling.  Gastrointestinal: Positive for abdominal pain. Negative for abdominal distention, diarrhea, nausea and vomiting.  Genitourinary: Positive for vaginal bleeding. Negative for difficulty urinating, flank pain, frequency, hematuria, menstrual problem, pelvic pain, vaginal discharge and vaginal  pain.  Musculoskeletal: Negative for back pain, neck pain and neck stiffness.  Skin: Negative for pallor.  Neurological: Negative for dizziness, speech difficulty, weakness and headaches.  Psychiatric/Behavioral: Negative for confusion.    Physical Exam Updated Vital Signs BP 122/69   Pulse 69   Temp 98 F (36.7 C) (Oral)   Resp 17   Ht 5\' 11"  (1.803 m)   Wt 102.4 kg   LMP 06/18/2020   SpO2 100%   BMI 31.48 kg/m   Physical Exam Constitutional:      General: She is not in acute distress.    Appearance: Normal appearance. She is not ill-appearing, toxic-appearing or diaphoretic.  HENT:     Mouth/Throat:     Mouth: Mucous membranes are moist.     Pharynx: Oropharynx is clear.  Eyes:     General: No scleral icterus.    Extraocular Movements: Extraocular movements intact.     Pupils: Pupils are equal, round, and reactive to light.  Cardiovascular:     Rate and Rhythm: Normal rate and regular rhythm.     Pulses: Normal pulses.     Heart sounds: Normal heart sounds.  Pulmonary:     Effort: Pulmonary effort is normal. No respiratory distress.     Breath sounds: Normal breath sounds. No stridor. No wheezing, rhonchi or rales.  Chest:     Chest wall: No tenderness.  Abdominal:     General: Abdomen is flat. There is no distension.     Palpations: Abdomen is soft.     Tenderness: There is no abdominal tenderness. There is no guarding or rebound.  Genitourinary:    Comments: Chaperone present.  Vaginal exam revealed mucus and blood coming from cervix, cervix does appear half a centimeter to a centimeter dilated. Musculoskeletal:        General: No swelling or tenderness. Normal range of motion.     Cervical back: Normal range of motion and neck supple. No rigidity.     Right lower leg: No edema.     Left lower leg: No edema.  Skin:    General: Skin is warm and dry.     Capillary Refill: Capillary refill takes less than 2 seconds.     Coloration: Skin is not pale.   Neurological:     General: No focal deficit present.     Mental Status: She is alert and oriented to person, place, and time.  Psychiatric:        Mood and Affect: Mood normal.        Behavior: Behavior normal.     ED Results /  Procedures / Treatments   Labs (all labs ordered are listed, but only abnormal results are displayed) Labs Reviewed  CBC - Abnormal; Notable for the following components:      Result Value   WBC 13.2 (*)    All other components within normal limits  COMPREHENSIVE METABOLIC PANEL - Abnormal; Notable for the following components:   Calcium 8.8 (*)    Alkaline Phosphatase 34 (*)    Total Bilirubin 0.2 (*)    All other components within normal limits  HCG, QUANTITATIVE, PREGNANCY - Abnormal; Notable for the following components:   hCG, Beta Chain, Laqueta Carina 299,371 (*)    All other components within normal limits  LIPASE, BLOOD  PROTIME-INR  APTT  URINALYSIS, ROUTINE W REFLEX MICROSCOPIC  ABO/RH    EKG None  Radiology US OB Comp < 14 Wks  Result Date: 09/18/2020 CLINICAL DATA:  Heavy vaginal bleeding with cramping. EXAM: OBSTETRIC <14 WK ULTRASOUND TECHNIQUE: Transabdominal ultrasound was performed for evaluation of the gestation as well as the maternal uterus and adnexal regions. COMPARISON:  None. FINDINGS: Intrauterine gestational sac: Single Yolk sac:  Not visualized. Embryo:  Visualized. Cardiac Activity: Visualized. Heart Rate: 158 bpm CRL: 67.5 mm   13 w 0 d                  [place] EDC: 03/26/2021 Subchorionic hemorrhage: Complex apparent fluid collection with no flow on color Doppler evaluation noted lower uterine segment towards the internal cervical os. This is towards inferior margin of the placenta on sagittal imaging. Large subchorionic hemorrhage/placental abruption would be considerations. Maternal uterus/adnexae: Neither maternal ovary visualized. IMPRESSION: Single living intrauterine gestation at estimated 13 week 0 day gestational age by  crown-rump length. Complex fluid collection along the inferior margin of the placenta on sagittal imaging. Large subchronic hemorrhage or marginal placental abruption a concern. Critical Value/emergent results were called by me at the time of interpretation on 09/18/2020 at 12:15 pm to provider [facility] Emmalene Kattner , who verbally acknowledged these results. Electronically Signed   By: Misty Stanley M.D.   On: 09/18/2020 12:15    Procedures Procedures   Medications Ordered in ED Medications - No data to display  ED Course  I have reviewed the triage vital signs and the nursing notes.  Pertinent labs & imaging results that were available during my care of the patient were reviewed by me and considered in my medical decision making (see chart for details).    MDM Rules/Calculators/A&P                         LAN ENTSMINGER is a 35 y.o. female G1 P0 that presents the emerge department today for vaginal bleeding.  Concern for threatened abortion.  Patient is hemodynamically stable, pelvic exam does reveal questionably dilated cervix, about a centimeter.  Work-up today reveals normal hemoglobin, small leukocytosis of 13.2, otherwise lab work unremarkable.  Ultrasound does reveal large subchorionic hemorrhage or marginal placental abruption, unable to proceed with transvaginal ultrasound due to chance of placental abruption. Did speak to Dr. Murrell Redden, OB/GYN who wants patient transferred to MAU to be evaluated.  Patient will transfer via private vehicle, did discuss risks of private vehicle, patient still wants to proceed with this.  Patient is still stable, to be transferred at this time.  Rh still in process, not given any RhoGam.  Was not able to obtain urinalysis.  The patient appears reasonably stabilized for transfer considering the current resources,  flow, and capabilities available in the ED at this time, and I doubt any other [facility] requiring further screening and/or treatment in the ED prior to  transfer.  Final Clinical Impression(s) / ED Diagnoses Final diagnoses:  Vaginal bleeding in pregnancy    Rx / DC Orders ED Discharge Orders    None       Alfredia Client, PA-C 09/18/20 1301    Davonna Belling, MD 09/18/20 [phone]

## 2020-09-18 NOTE — MAU Provider Note (Signed)
History     CSN: [id number]  Arrival date and time: 09/18/20 1937   Event Date/Time   First Provider Initiated Contact with Patient 09/18/20 1457      Chief Complaint  Patient presents with  . Vaginal Bleeding   HPI Mikayla Hicks is a 35 y.o. G1P0 at [redacted]w[redacted]d who presents for vaginal bleeding. Has had bleeding off & on since last week. Was seen by Dr. Lanny Cramp last week & had normal exam. Went to [facility] this morning due to increase in bleeding. Reports heavier bright red bleeding & passed 2 golf ball sized clots. Had some lower abdominal cramping earlier which has since resolved. Was evaluated in [facility] & transferred here after the ED provided consulted with Dr. Murrell Redden.  Patient reports that bleeding has stopped since leaving [facility] and is no longer having abdominal pain.   OB History    Gravida  1   Para      Term      Preterm      AB      Living        SAB      IAB      Ectopic      Multiple      Live Births              Past Medical History:  Diagnosis Date  . Allergy   . Anxiety    just recently and seeing therapist.  . Hemangioma   . Migraines     Past Surgical History:  Procedure Laterality Date  . BACK SURGERY      Family History  Problem Relation Age of Onset  . Healthy Mother   . Healthy Father     Social History   Tobacco Use  . Smoking status: Never Smoker  . Smokeless tobacco: Never Used  Vaping Use  . Vaping Use: Never used  Substance Use Topics  . Alcohol use: Yes    Comment: rare and if does very little.  . Drug use: No    Allergies: No Known Allergies  Medications Prior to Admission  Medication Sig Dispense Refill Last Dose  . amoxicillin-clavulanate (AUGMENTIN) 875-125 MG tablet TAKE 1 TABLET BY MOUTH 2 (TWO) TIMES DAILY. 20 tablet 0   . b complex vitamins capsule Take 1 capsule by mouth daily.     . busPIRone (BUSPAR) 7.5 MG tablet Take 1 tablet (7.5 mg total) by mouth 2 (two) times daily. (Patient taking  differently: Take 7.5 mg by mouth daily as needed.) 60 tablet 4   . cetirizine (ZYRTEC) 10 MG chewable tablet Chew 10 mg by mouth daily.     . cholecalciferol (VITAMIN D3) 25 MCG (1000 UNIT) tablet Take 1,000 Units by mouth daily.     Marland Kitchen lisdexamfetamine (VYVANSE) 20 MG capsule TAKE 1 CAPSULE (20 MG TOTAL) BY MOUTH DAILY. 30 capsule 0   . Omega-3 Fatty Acids (FISH OIL) 1000 MG CAPS Take by mouth.       Review of Systems  Constitutional: Negative.   Gastrointestinal: Positive for abdominal pain (none currently).  Genitourinary: Positive for vaginal bleeding.   Physical Exam   Blood pressure 125/71, pulse 88, temperature 98.2 F (36.8 C), temperature source Oral, resp. rate 16, height 5\' 11"  (1.803 m), weight 102.4 kg, last menstrual period 06/18/2020, SpO2 100 %.  Physical Exam Vitals and nursing note reviewed. Exam conducted with a chaperone present.  Constitutional:      General: She is not in acute distress.  Appearance: Normal appearance.  HENT:     Head: Normocephalic and atraumatic.  Pulmonary:     Effort: Pulmonary effort is normal. No respiratory distress.  Genitourinary:    General: Normal vulva.     Exam position: Lithotomy position.     Comments: Dark red blood coming from introitus; 2x6 cm area of blood on pad that's been in place for an hour.  Cervix closed/thick/firm Skin:    General: Skin is warm and dry.  Neurological:     Mental Status: She is alert.  Psychiatric:        Mood and Affect: Mood normal.        Behavior: Behavior normal.     MAU Course  Procedures Results for orders placed or performed during the hospital encounter of 09/18/20 (from the past 24 hour(s))  CBC     Status: Abnormal   Collection Time: 09/18/20 11:03 AM  Result Value Ref Range   WBC 13.2 (H) 4.0 - 10.5 K/uL   RBC 4.77 3.87 - 5.11 MIL/uL   Hemoglobin 13.7 12.0 - 15.0 g/dL   HCT 40.6 36.0 - 46.0 %   MCV 85.1 80.0 - 100.0 fL   MCH 28.7 26.0 - 34.0 pg   MCHC 33.7 30.0 - 36.0  g/dL   RDW 12.2 11.5 - 15.5 %   Platelets 254 150 - 400 K/uL   nRBC 0.0 0.0 - 0.2 %  Comprehensive metabolic panel     Status: Abnormal   Collection Time: 09/18/20 11:03 AM  Result Value Ref Range   Sodium 135 135 - 145 mmol/L   Potassium 3.9 3.5 - 5.1 mmol/L   Chloride 103 98 - 111 mmol/L   CO2 25 22 - 32 mmol/L   Glucose, Bld 90 70 - 99 mg/dL   BUN 11 6 - 20 mg/dL   Creatinine, Ser 0.73 0.44 - 1.00 mg/dL   Calcium 8.8 (L) 8.9 - 10.3 mg/dL   Total Protein 6.5 6.5 - 8.1 g/dL   Albumin 3.5 3.5 - 5.0 g/dL   AST 15 15 - 41 U/L   ALT 13 0 - 44 U/L   Alkaline Phosphatase 34 (L) 38 - 126 U/L   Total Bilirubin 0.2 (L) 0.3 - 1.2 mg/dL   GFR, Estimated >60 >60 mL/min   Anion gap 7 5 - 15  hCG, quantitative, pregnancy     Status: Abnormal   Collection Time: 09/18/20 11:03 AM  Result Value Ref Range   hCG, Beta Chain, Quant, S 113,654 (H) <5 mIU/mL  Lipase, blood     Status: None   Collection Time: 09/18/20 11:03 AM  Result Value Ref Range   Lipase 28 11 - 51 U/L  Protime-INR     Status: None   Collection Time: 09/18/20 11:03 AM  Result Value Ref Range   Prothrombin Time 12.3 11.4 - 15.2 seconds   INR 1.0 0.8 - 1.2  APTT     Status: None   Collection Time: 09/18/20 11:03 AM  Result Value Ref Range   aPTT 25 24 - 36 seconds   US OB Comp Less 14 Wks  Result Date: 09/18/2020 CLINICAL DATA:  Vaginal bleeding. EXAM: OBSTETRIC <14 WK US OB US TECHNIQUE: Both transabdominal and transvaginal ultrasound examinations were performed for complete evaluation of the gestation as well as the maternal uterus, adnexal regions, and pelvic cul-de-sac. Transvaginal technique was performed to assess early pregnancy. COMPARISON:  September 18, 2020 FINDINGS: Intrauterine gestational sac: Single Yolk sac:  Not Visualized. Embryo:  Visualized. Cardiac Activity: Visualized. Heart Rate: 161 bpm CRL:  66.5 mm   12 w   6 d                  [place] EDC: 03/27/2021 Subchorionic hemorrhage: Again seen is a large complex  fluid collection in the lower uterine segment and at the inferior margin of the placenta. Large subchorionic hemorrhage or placental abruption are considerations. The collection is grossly stable from the prior study. Maternal uterus/adnexae: Normal adnexa. IMPRESSION: Grossly stable in size from the prior study large complex fluid collection in the lower uterine segment at the inferior margin of the placenta. Live intrauterine pregnancy corresponding to 12 weeks and 6 days gestation. Electronically Signed   By: Fidela Salisbury M.D.   On: 09/18/2020 16:33   US OB Comp < 14 Wks  Result Date: 09/18/2020 CLINICAL DATA:  Heavy vaginal bleeding with cramping. EXAM: OBSTETRIC <14 WK ULTRASOUND TECHNIQUE: Transabdominal ultrasound was performed for evaluation of the gestation as well as the maternal uterus and adnexal regions. COMPARISON:  None. FINDINGS: Intrauterine gestational sac: Single Yolk sac:  Not visualized. Embryo:  Visualized. Cardiac Activity: Visualized. Heart Rate: 158 bpm CRL: 67.5 mm   13 w 0 d                  [place] EDC: 03/26/2021 Subchorionic hemorrhage: Complex apparent fluid collection with no flow on color Doppler evaluation noted lower uterine segment towards the internal cervical os. This is towards inferior margin of the placenta on sagittal imaging. Large subchorionic hemorrhage/placental abruption would be considerations. Maternal uterus/adnexae: Neither maternal ovary visualized. IMPRESSION: Single living intrauterine gestation at estimated 13 week 0 day gestational age by crown-rump length. Complex fluid collection along the inferior margin of the placenta on sagittal imaging. Large subchronic hemorrhage or marginal placental abruption a concern. Critical Value/emergent results were called by me at the time of interpretation on 09/18/2020 at 12:15 pm to provider [facility] PATEL , who verbally acknowledged these results. Electronically Signed   By: Misty Stanley M.D.   On: 09/18/2020 12:15     MDM Patient presents with vaginal bleeding. She is RH positive. Cervix is closed.   Ultrasound repeated - shows large complex fluid collection in lower uterine segment that is likely a large subchorionic hemorrhage.  Bleeding has slowed down & vitals are stable.   Assessment and Plan   1. Subchorionic hematoma in first trimester, single or unspecified fetus   2. [redacted] weeks gestation of pregnancy   3. Vaginal bleeding in pregnancy, first trimester    -reviewed bleeding precautions -Pelvic rest -Per Dr. Murrell Redden, pt to call office tomorrow & schedule f/u appointment later this week  Jorje Guild 09/18/2020, 2:57 PM

## 2020-09-18 NOTE — ED Notes (Signed)
Pt transferred to MAU. Pt left via POV with significant other. IV access remains in place per EDP order. Pt in NAD on departure. VSS.

## 2020-09-18 NOTE — Progress Notes (Signed)
Called by PA re: patient 13wks with vb, abdominal u/s done with ? [facility] or abruption, fht 158 - recommend patient is transferred to MAU for complete evaluation. Agrees to transfer.

## 2020-09-18 NOTE — ED Triage Notes (Signed)
Pt reports reports heavy bright red bleeding with several large clots starting about 1 hr PTA. Pt is [redacted] weeks pregnant. Also endorses 3/10 lower abd pain.

## 2020-09-18 NOTE — Discharge Instructions (Addendum)
Subchorionic Hematoma  A hematoma is a collection of blood outside of the blood vessels. A subchorionic hematoma is a collection of blood between the outer wall of the embryo (chorion) and the inner wall of the uterus. This condition can cause vaginal bleeding. Early small hematomas usually shrink on their own and do not affect your baby or pregnancy. When bleeding starts later in pregnancy, or if the hematoma is larger or occurs in older pregnant women, the condition may be more serious. Larger hematomas increase the chances of miscarriage. This condition also increases the risk of:  Premature separation of the placenta from the uterus.  Premature (preterm) labor.  Stillbirth. What are the causes? The exact cause of this condition is not known. It occurs when blood is trapped between the placenta and the uterine wall because the placenta has separated from the original site of implantation. What increases the risk? You are more likely to develop this condition if:  You were treated with fertility medicines.  You became pregnant through in vitro fertilization (IVF). What are the signs or symptoms? Symptoms of this condition include:  Vaginal spotting or bleeding.  Abdominal pain. This is rare. Sometimes you may have no symptoms and the bleeding may only be seen when ultrasound images are taken (transvaginal ultrasound). How is this diagnosed? This condition is diagnosed based on a physical exam. This includes a pelvic exam. You may also have other tests, including:  Blood tests.  Urine tests.  Ultrasound of the abdomen. How is this treated? Treatment for this condition can vary. Treatment may include:  Watchful waiting. You will be monitored closely for any changes in bleeding.  Medicines.  Activity restriction. This may be needed until the bleeding stops.  A medicine called Rh immunoglobulin. This is given if you have an Rh-negative blood type. It prevents Rh  sensitization. Follow these instructions at home:  Stay on bed rest if told to do so by your health care provider.  Do not lift anything that is heavier than 10 lb (4.5 kg), or the limit that you are told by your health care provider.  Track and write down the number of pads you use each day and how soaked (saturated) they are.  Do not use tampons.  Keep all follow-up visits. This is important. Your health care provider may ask you to have follow-up blood tests or ultrasound tests or both. Contact a health care provider if:  You have any vaginal bleeding.  You have a fever. Get help right away if:  You have severe cramps in your stomach, back, abdomen, or pelvis.  You pass large clots or tissue. Save any tissue for your health care provider to look at.  You faint.  You become light-headed or weak. Summary  A subchorionic hematoma is a collection of blood between the outer wall of the embryo (chorion) and the inner wall of the uterus.  This condition can cause vaginal bleeding.  Sometimes you may have no symptoms and the bleeding may only be seen when ultrasound images are taken.  Treatment may include watchful waiting, medicines, or activity restriction.  Keep all follow-up visits. Get help right away if you have severe cramps or heavy vaginal bleeding. This information is not intended to replace advice given to you by your health care provider. Make sure you discuss any questions you have with your health care provider. Document Revised: 02/22/2020 Document Reviewed: 02/22/2020 Elsevier Patient Education  2021 [employer].

## 2020-09-18 NOTE — MAU Note (Signed)
Pt reports to [facility] after [facility] for vag bleeding.  Pt states she passed 2 tennis ball sized clots this morning and has continued to bleed, but states it has gotten lighter.  Pt denies any pain at this time.

## 2020-10-06 ENCOUNTER — Other Ambulatory Visit: Payer: Self-pay | Admitting: Obstetrics and Gynecology

## 2020-10-06 DIAGNOSIS — Z361 Encounter for antenatal screening for raised alphafetoprotein level: Secondary | ICD-10-CM | POA: Diagnosis not present

## 2020-10-06 DIAGNOSIS — Z3402 Encounter for supervision of normal first pregnancy, second trimester: Secondary | ICD-10-CM | POA: Diagnosis not present

## 2020-10-06 DIAGNOSIS — R112 Nausea with vomiting, unspecified: Secondary | ICD-10-CM | POA: Diagnosis not present

## 2020-11-03 ENCOUNTER — Encounter: Payer: Self-pay | Admitting: Obstetrics and Gynecology

## 2020-11-03 DIAGNOSIS — Z363 Encounter for antenatal screening for malformations: Secondary | ICD-10-CM | POA: Diagnosis not present

## 2020-11-03 DIAGNOSIS — Z3A19 19 weeks gestation of pregnancy: Secondary | ICD-10-CM | POA: Diagnosis not present

## 2020-11-03 DIAGNOSIS — O418X99 Other specified disorders of amniotic fluid and membranes, unspecified trimester, other fetus: Secondary | ICD-10-CM | POA: Diagnosis not present

## 2020-11-03 DIAGNOSIS — O43892 Other placental disorders, second trimester: Secondary | ICD-10-CM | POA: Diagnosis not present

## 2020-11-03 DIAGNOSIS — Z3402 Encounter for supervision of normal first pregnancy, second trimester: Secondary | ICD-10-CM | POA: Diagnosis not present

## 2020-11-10 ENCOUNTER — Other Ambulatory Visit: Payer: Self-pay | Admitting: Obstetrics and Gynecology

## 2020-11-10 DIAGNOSIS — O43102 Malformation of placenta, unspecified, second trimester: Secondary | ICD-10-CM

## 2020-11-10 DIAGNOSIS — Z3A21 21 weeks gestation of pregnancy: Secondary | ICD-10-CM

## 2020-11-10 DIAGNOSIS — Z363 Encounter for antenatal screening for malformations: Secondary | ICD-10-CM

## 2020-11-14 ENCOUNTER — Encounter: Payer: Self-pay | Admitting: *Deleted

## 2020-11-15 ENCOUNTER — Ambulatory Visit: Payer: 59

## 2020-11-16 ENCOUNTER — Encounter: Payer: Self-pay | Admitting: *Deleted

## 2020-11-16 ENCOUNTER — Ambulatory Visit: Payer: 59 | Attending: Obstetrics | Admitting: *Deleted

## 2020-11-16 ENCOUNTER — Ambulatory Visit (HOSPITAL_BASED_OUTPATIENT_CLINIC_OR_DEPARTMENT_OTHER): Payer: 59

## 2020-11-16 ENCOUNTER — Other Ambulatory Visit: Payer: Self-pay

## 2020-11-16 VITALS — BP 123/68 | HR 71

## 2020-11-16 DIAGNOSIS — Z3689 Encounter for other specified antenatal screening: Secondary | ICD-10-CM

## 2020-11-16 DIAGNOSIS — O43892 Other placental disorders, second trimester: Secondary | ICD-10-CM | POA: Diagnosis present

## 2020-11-16 DIAGNOSIS — Z363 Encounter for antenatal screening for malformations: Secondary | ICD-10-CM | POA: Diagnosis not present

## 2020-11-16 DIAGNOSIS — O43102 Malformation of placenta, unspecified, second trimester: Secondary | ICD-10-CM | POA: Diagnosis not present

## 2020-11-16 DIAGNOSIS — O43192 Other malformation of placenta, second trimester: Secondary | ICD-10-CM | POA: Insufficient documentation

## 2020-11-16 DIAGNOSIS — Z3A21 21 weeks gestation of pregnancy: Secondary | ICD-10-CM | POA: Diagnosis not present

## 2020-11-17 ENCOUNTER — Other Ambulatory Visit: Payer: Self-pay | Admitting: *Deleted

## 2020-11-17 DIAGNOSIS — O43192 Other malformation of placenta, second trimester: Secondary | ICD-10-CM

## 2020-11-29 ENCOUNTER — Other Ambulatory Visit: Payer: Self-pay

## 2020-11-29 ENCOUNTER — Encounter (HOSPITAL_COMMUNITY): Payer: Self-pay | Admitting: Obstetrics & Gynecology

## 2020-11-29 ENCOUNTER — Inpatient Hospital Stay (HOSPITAL_COMMUNITY)
Admission: AD | Admit: 2020-11-29 | Discharge: 2021-02-05 | DRG: 786 | Disposition: A | Discharge status: Home / Self Care | Payer: 59 | Attending: Obstetrics and Gynecology | Admitting: Obstetrics and Gynecology

## 2020-11-29 ENCOUNTER — Inpatient Hospital Stay (HOSPITAL_COMMUNITY): Payer: 59

## 2020-11-29 DIAGNOSIS — O43123 Velamentous insertion of umbilical cord, third trimester: Secondary | ICD-10-CM | POA: Diagnosis present

## 2020-11-29 DIAGNOSIS — Z3A27 27 weeks gestation of pregnancy: Secondary | ICD-10-CM | POA: Diagnosis not present

## 2020-11-29 DIAGNOSIS — O42912 Preterm premature rupture of membranes, unspecified as to length of time between rupture and onset of labor, second trimester: Secondary | ICD-10-CM | POA: Diagnosis not present

## 2020-11-29 DIAGNOSIS — O42919 Preterm premature rupture of membranes, unspecified as to length of time between rupture and onset of labor, unspecified trimester: Secondary | ICD-10-CM | POA: Diagnosis present

## 2020-11-29 DIAGNOSIS — O99892 Other specified diseases and conditions complicating childbirth: Secondary | ICD-10-CM | POA: Diagnosis present

## 2020-11-29 DIAGNOSIS — O43192 Other malformation of placenta, second trimester: Secondary | ICD-10-CM | POA: Diagnosis not present

## 2020-11-29 DIAGNOSIS — O99824 Streptococcus B carrier state complicating childbirth: Secondary | ICD-10-CM | POA: Diagnosis present

## 2020-11-29 DIAGNOSIS — Z3A23 23 weeks gestation of pregnancy: Secondary | ICD-10-CM

## 2020-11-29 DIAGNOSIS — D62 Acute posthemorrhagic anemia: Secondary | ICD-10-CM | POA: Diagnosis not present

## 2020-11-29 DIAGNOSIS — Z3A32 32 weeks gestation of pregnancy: Secondary | ICD-10-CM | POA: Diagnosis not present

## 2020-11-29 DIAGNOSIS — Z3A26 26 weeks gestation of pregnancy: Secondary | ICD-10-CM | POA: Diagnosis not present

## 2020-11-29 DIAGNOSIS — Z86018 Personal history of other benign neoplasm: Secondary | ICD-10-CM | POA: Diagnosis not present

## 2020-11-29 DIAGNOSIS — O43193 Other malformation of placenta, third trimester: Secondary | ICD-10-CM | POA: Diagnosis not present

## 2020-11-29 DIAGNOSIS — O479 False labor, unspecified: Secondary | ICD-10-CM | POA: Diagnosis not present

## 2020-11-29 DIAGNOSIS — O42112 Preterm premature rupture of membranes, onset of labor more than 24 hours following rupture, second trimester: Secondary | ICD-10-CM | POA: Diagnosis not present

## 2020-11-29 DIAGNOSIS — O289 Unspecified abnormal findings on antenatal screening of mother: Secondary | ICD-10-CM | POA: Diagnosis not present

## 2020-11-29 DIAGNOSIS — Z3A28 28 weeks gestation of pregnancy: Secondary | ICD-10-CM | POA: Diagnosis not present

## 2020-11-29 DIAGNOSIS — Z3A33 33 weeks gestation of pregnancy: Secondary | ICD-10-CM | POA: Diagnosis not present

## 2020-11-29 DIAGNOSIS — Z3A24 24 weeks gestation of pregnancy: Secondary | ICD-10-CM | POA: Diagnosis not present

## 2020-11-29 DIAGNOSIS — O321XX0 Maternal care for breech presentation, not applicable or unspecified: Secondary | ICD-10-CM | POA: Diagnosis present

## 2020-11-29 DIAGNOSIS — O411230 Chorioamnionitis, third trimester, not applicable or unspecified: Secondary | ICD-10-CM | POA: Diagnosis present

## 2020-11-29 DIAGNOSIS — D1809 Hemangioma of other sites: Secondary | ICD-10-CM | POA: Diagnosis not present

## 2020-11-29 DIAGNOSIS — O4290 Premature rupture of membranes, unspecified as to length of time between rupture and onset of labor, unspecified weeks of gestation: Secondary | ICD-10-CM

## 2020-11-29 DIAGNOSIS — Z20822 Contact with and (suspected) exposure to covid-19: Secondary | ICD-10-CM | POA: Diagnosis not present

## 2020-11-29 DIAGNOSIS — O99214 Obesity complicating childbirth: Secondary | ICD-10-CM | POA: Diagnosis present

## 2020-11-29 DIAGNOSIS — Z3A31 31 weeks gestation of pregnancy: Secondary | ICD-10-CM | POA: Diagnosis not present

## 2020-11-29 DIAGNOSIS — B951 Streptococcus, group B, as the cause of diseases classified elsewhere: Secondary | ICD-10-CM | POA: Diagnosis present

## 2020-11-29 DIAGNOSIS — O9081 Anemia of the puerperium: Secondary | ICD-10-CM | POA: Diagnosis not present

## 2020-11-29 DIAGNOSIS — O42119 Preterm premature rupture of membranes, onset of labor more than 24 hours following rupture, unspecified trimester: Secondary | ICD-10-CM

## 2020-11-29 DIAGNOSIS — Z3A39 39 weeks gestation of pregnancy: Secondary | ICD-10-CM | POA: Diagnosis not present

## 2020-11-29 DIAGNOSIS — D649 Anemia, unspecified: Secondary | ICD-10-CM | POA: Diagnosis present

## 2020-11-29 DIAGNOSIS — O42913 Preterm premature rupture of membranes, unspecified as to length of time between rupture and onset of labor, third trimester: Secondary | ICD-10-CM | POA: Diagnosis not present

## 2020-11-29 DIAGNOSIS — Z3A25 25 weeks gestation of pregnancy: Secondary | ICD-10-CM | POA: Diagnosis not present

## 2020-11-29 DIAGNOSIS — Z3A29 29 weeks gestation of pregnancy: Secondary | ICD-10-CM | POA: Diagnosis not present

## 2020-11-29 DIAGNOSIS — Z3A30 30 weeks gestation of pregnancy: Secondary | ICD-10-CM | POA: Diagnosis not present

## 2020-11-29 HISTORY — DX: Preterm premature rupture of membranes, unspecified as to length of time between rupture and onset of labor, unspecified trimester: O42.919

## 2020-11-29 LAB — RESP PANEL BY RT-PCR (FLU A&B, COVID) ARPGX2
Influenza A by PCR: NEGATIVE
Influenza B by PCR: NEGATIVE
SARS Coronavirus 2 by RT PCR: NEGATIVE

## 2020-11-29 LAB — CBC
HCT: 38.8 % (ref 36.0–46.0)
Hemoglobin: 12.9 g/dL (ref 12.0–15.0)
MCH: 27.8 pg (ref 26.0–34.0)
MCHC: 33.2 g/dL (ref 30.0–36.0)
MCV: 83.6 fL (ref 80.0–100.0)
Platelets: 285 10*3/uL (ref 150–400)
RBC: 4.64 MIL/uL (ref 3.87–5.11)
RDW: 12.8 % (ref 11.5–15.5)
WBC: 12.4 10*3/uL — ABNORMAL HIGH (ref 4.0–10.5)
nRBC: 0 % (ref 0.0–0.2)

## 2020-11-29 LAB — COMPREHENSIVE METABOLIC PANEL
ALT: 15 U/L (ref 0–44)
AST: 14 U/L — ABNORMAL LOW (ref 15–41)
Albumin: 2.9 g/dL — ABNORMAL LOW (ref 3.5–5.0)
Alkaline Phosphatase: 63 U/L (ref 38–126)
Anion gap: 8 (ref 5–15)
BUN: 8 mg/dL (ref 6–20)
CO2: 21 mmol/L — ABNORMAL LOW (ref 22–32)
Calcium: 8.6 mg/dL — ABNORMAL LOW (ref 8.9–10.3)
Chloride: 107 mmol/L (ref 98–111)
Creatinine, Ser: 0.53 mg/dL (ref 0.44–1.00)
GFR, Estimated: >60 mL/min (ref >60)
Glucose, Bld: 87 mg/dL (ref 70–99)
Potassium: 3.9 mmol/L (ref 3.5–5.1)
Sodium: 136 mmol/L (ref 135–145)
Total Bilirubin: 0.4 mg/dL (ref 0.3–1.2)
Total Protein: 6.1 g/dL — ABNORMAL LOW (ref 6.5–8.1)

## 2020-11-29 LAB — TYPE AND SCREEN
ABO/RH(D): A POS
Antibody Screen: NEGATIVE

## 2020-11-29 LAB — POCT FERN TEST: POCT Fern Test: POSITIVE

## 2020-11-29 LAB — OB RESULTS CONSOLE GBS: GBS: POSITIVE

## 2020-11-29 MED ORDER — PRENATAL MULTIVITAMIN CH
1.0000 | ORAL_TABLET | Freq: Every day | ORAL | Status: DC
  Administered 2020-11-30 – 2021-02-02 (×64): 1 via ORAL
  Filled 2020-11-29 (×64): qty 1

## 2020-11-29 MED ORDER — ERYTHROMYCIN BASE 250 MG PO TABS
250.0000 mg | ORAL_TABLET | Freq: Three times a day (TID) | ORAL | Status: AC
  Administered 2020-12-01 – 2020-12-06 (×13): 250 mg via ORAL
  Filled 2020-11-29 (×17): qty 1

## 2020-11-29 MED ORDER — SODIUM CHLORIDE 0.9% FLUSH
3.0000 mL | Freq: Two times a day (BID) | INTRAVENOUS | Status: DC
  Administered 2020-11-29 – 2020-12-06 (×10): 3 mL via INTRAVENOUS

## 2020-11-29 MED ORDER — AMOXICILLIN 500 MG PO CAPS
500.0000 mg | ORAL_CAPSULE | Freq: Three times a day (TID) | ORAL | Status: AC
  Administered 2020-12-01 – 2020-12-06 (×14): 500 mg via ORAL
  Filled 2020-11-29 (×14): qty 1

## 2020-11-29 MED ORDER — SODIUM CHLORIDE 0.9 % IV SOLN
250.0000 mL | INTRAVENOUS | Status: DC | PRN
  Administered 2020-11-29: 250 mL via INTRAVENOUS

## 2020-11-29 MED ORDER — DOCUSATE SODIUM 100 MG PO CAPS
100.0000 mg | ORAL_CAPSULE | Freq: Every day | ORAL | Status: DC
  Administered 2020-11-30 – 2020-12-24 (×16): 100 mg via ORAL
  Filled 2020-11-29 (×31): qty 1

## 2020-11-29 MED ORDER — SODIUM CHLORIDE 0.9 % IV SOLN
2.0000 g | Freq: Four times a day (QID) | INTRAVENOUS | Status: AC
  Administered 2020-11-29 – 2020-12-01 (×8): 2 g via INTRAVENOUS
  Filled 2020-11-29 (×8): qty 2000

## 2020-11-29 MED ORDER — CALCIUM CARBONATE ANTACID 500 MG PO CHEW: 2.0000 | CHEWABLE_TABLET | ORAL | Status: DC | PRN

## 2020-11-29 MED ORDER — SODIUM CHLORIDE 0.9 % IV SOLN
250.0000 mg | Freq: Four times a day (QID) | INTRAVENOUS | Status: AC
  Administered 2020-11-29 – 2020-12-01 (×8): 250 mg via INTRAVENOUS
  Filled 2020-11-29 (×10): qty 5

## 2020-11-29 MED ORDER — ACETAMINOPHEN 325 MG PO TABS
650.0000 mg | ORAL_TABLET | ORAL | Status: DC | PRN
  Administered 2020-12-05 – 2020-12-06 (×2): 650 mg via ORAL
  Filled 2020-11-29 (×2): qty 2

## 2020-11-29 MED ORDER — ZOLPIDEM TARTRATE 5 MG PO TABS: 5.0000 mg | ORAL_TABLET | Freq: Every evening | ORAL | Status: DC | PRN

## 2020-11-29 MED ORDER — BETAMETHASONE SOD PHOS & ACET 6 (3-3) MG/ML IJ SUSP
12.0000 mg | INTRAMUSCULAR | Status: AC
  Administered 2020-11-29 – 2020-11-30 (×2): 12 mg via INTRAMUSCULAR
  Filled 2020-11-29: qty 5

## 2020-11-29 MED ORDER — SODIUM CHLORIDE 0.9% FLUSH: 3.0000 mL | INTRAVENOUS | Status: DC | PRN

## 2020-11-29 NOTE — H&P (Signed)
Mikayla Hicks is a 35 y.o. female G1P0 [redacted]w[redacted]d presenting for PPROM. Patient reports feeling LOF while laying in bed this morning around 5AM. Initially thought she had urinary incontinence but continued to have small gushes of clear fluid even after emptying her bladder. No VB or abdominal pain, cramping, or contractions. Has felt good FM throughout. She was initially seen in the office where ROM evaluation was positive for nitrazine but inconclusive for pooling and ferning. Initially sent to MAU for Amnisure, however upon arrival, obvious LOF with positive ferning was noted and determined +ROM.   History of infertility had been TTC for over 1 year but spontaneous conception. Pregnancy is dated by LMP c/w 6w [place] and has received routine PNC at [facility] since that time. First trimester significant for recurrent VB, no [facility] appreciated on CP scans in office but present on MAU scan at 12 weeks and measuring 6cm in size at time of Anatomy scan at 20 weeks. Last episode of VB around 19 weeks. Anatomy scan also significant for abnormal PCI with suspected MCI but could not rule out velamentous cord and vasa previa at that time. She was referred to MFM for second opinion and consultation. Patient seen on 11/16/20 by MFM and US showed EFW 416 g (32%) with normal anatomy but difficult to visualize PCI due to fetal position, however suspected MCI and velamentous cord thought to be unlikely, with normal amniotic fluid MVP 4.31 cm and no evidence of [place] at that time noted.   Prenatal labs within normal limits including low risk Panorama predicting female fetus and negative AFP screen.  Medical history significant for anxiety previously on Buspar, discontinued with positive pregnancy and managing well without medications. Significant N/V in the first trimester with improvement on antiemetics and appropriate weight gain overall. Currently taking Zofran only PRN.  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 Beverly Milch in early 2000's but has since retired and is working on getting appt to see Dr. Sherwood Gambler and due for MRI follow up -was diagnosed when she was in 6th grade (thinks 25 or 15Y) when she had frequent falls, seen in ER and found to have mass wrapped around spinal cord- urgent surgery and had 1/2 removed -some growth during puberty but otherwise stable, but has not had recent follow up  OB History     Gravida  1   Para      Term      Preterm      AB      Living         SAB      IAB      Ectopic      Multiple      Live Births             Past Medical History:  Diagnosis Date   Allergy    Anxiety    just recently and seeing therapist.   Hemangioma vertebral column    Migraines    Past Surgical History:  Procedure Laterality Date   BACK SURGERY     Family History: family history includes Healthy in her father and mother. Social History:  reports that she has never smoked. She has never used smokeless tobacco. She reports current alcohol use. She reports that she does not use drugs.     Maternal Diabetes: No Genetic Screening: Normal Maternal Ultrasounds/Referrals: Other: Fetal Ultrasounds or other Referrals:  Referred to Materal Fetal Medicine  Maternal Substance Abuse:  No  Significant Maternal Medications:  Meds include: Other:  Significant Maternal Lab Results:  None Other Comments:  None  Review of Systems  All other systems reviewed and are negative. Per HPI Maternal Exam:  Abdomen: Patient reports no abdominal tenderness. Introitus: Normal vulva. Ferning test: positive.  Nitrazine test: positive. Amniotic fluid character: clear. Cervix: Cervix evaluated by sterile speculum exam.     Fetal Exam Fetal Monitor Review: Baseline rate: 150.  Variability: moderate (6-25 bpm).   Fetal State Assessment: Category I - tracings are normal.  Physical Exam Vitals reviewed.  Constitutional:      Appearance:  Normal appearance.  HENT:     Head: Normocephalic.  Cardiovascular:     Rate and Rhythm: Normal rate.  Pulmonary:     Effort: Pulmonary effort is normal.  Abdominal:     Tenderness: There is no abdominal tenderness.  Genitourinary:    General: Normal vulva.  Musculoskeletal:        General: Normal range of motion.     Cervical back: Normal range of motion.  Skin:    General: Skin is warm and dry.  Neurological:     General: No focal deficit present.     Mental Status: She is alert and oriented to person, place, and time.  Psychiatric:        Mood and Affect: Mood normal.        Behavior: Behavior normal.      Blood pressure 137/74, pulse 75, temperature 98.9 F (37.2 C), temperature source Oral, resp. rate 16, height 5\' 11"  (1.803 m), weight 101.8 kg, last menstrual period 06/18/2020, SpO2 98 %.  Prenatal labs: ABO, Rh:  --/--/A POS (06/21 1300) Antibody: NEG (06/21 1300) Rubella:  Immune RPR:   NR HBsAg:   NEG HIV:   NR GBS:   pending  Chemistry Recent Labs  Lab 11/29/20 1301  NA 136  K 3.9  CL 107  CO2 21*  GLUCOSE 87  BUN 8  CREATININE 0.53  CALCIUM 8.6*  GFRNONAA >60  ANIONGAP 8    Recent Labs  Lab 11/29/20 1301  PROT 6.1*  ALBUMIN 2.9*  AST 14*  ALT 15  ALKPHOS 63  BILITOT 0.4   Hematology Recent Labs  Lab 11/29/20 1301  WBC 12.4*  RBC 4.64  HGB 12.9  HCT 38.8  MCV 83.6  MCH 27.8  MCHC 33.2  RDW 12.8  PLT 285     Assessment/Plan: Mikayla Hicks is a 35 y.o. female G1P0 [redacted]w[redacted]d admitted with confirmed PPROM on 6/21  Patient counseled on PPROM diagnosis and recommendation for inpatient management for latency antibiotics, monitoring for signs and symptoms of preterm labor and/or chorioamnionitis, and close fetal surveillance. Patient appropriately emotional but coping well. Emotional support provided and reviewed plan of care as follows:  -Inpatient admission to antepartum unit  -Latency antibiotics with IV Ampicillin 2g q6hr and  Erythromycin 250mg  q6hr for 48hr followed by PO Amoxicillin 500mg  q8hr and Erythromycin 250mg  q8hr for 5 days -MFM [place] today and discussed with Dr. Gertie Exon this evening regarding recommendations. Plan for BMZ now. Reviewed PCI, believed to be MCI with funic presentation rather than vasa previa, but difficult to confirm due to deferred transvaginal scan given ruptured status. Plan for repeat scan in 1 week or sooner to evaluate cord position if signs of labor -Magnesium for neuro-protection if delivery imminent  -Currently breech with cord presentation, plan for cesarean if delivery imminent -NICU consult pending -Fetal monitoring TID with continuous Toco -GBS pending -Monitor for  signs and symptoms of infection: monitor Temp while awake, CBC and TS q 72hr -Regular diet -SCD VTE ppx -Modified bed rest with bathroom privileges  -Routine antepartum care, daily PNV, and bowel regimen PRN  Kirby Argueta A Hermina Barnard 11/29/2020, 3:51 PM

## 2020-11-29 NOTE — MAU Note (Signed)
Mikayla Hicks is a 35 y.o. at [redacted]w[redacted]d here in MAU reporting: LOF that started around 0700, it has been clear. Has has multiple trickles and it runs down her leg. No bleeding. Had some pain earlier but none now. +FM. Last IC was a couple days ago.  Was at her OB office this AM and they did a pelvic exam, states fern slides were inconclusive and was sent to MAU for further evaluation.   Onset of complaint: today  Pain score: 0/10  Vitals:   11/29/20 1205  BP: 137/74  Pulse: 75  Resp: 16  Temp: 98.9 F (37.2 C)  SpO2: 98%     FHT: EFM applied in room  Lab orders placed from triage: none

## 2020-11-29 NOTE — MAU Provider Note (Signed)
Chief Complaint  Patient presents with   Rupture of Membranes     Event Date/Time   First Provider Initiated Contact with Patient 11/29/20 1219       S: Mikayla Hicks  is a 35 y.o. y.o. year old G1P0 female at [redacted]w[redacted]d weeks gestation by 5.6 week [place] and LMP who presents to MAU reporting leaking several trickles of clear fluid since 0700 this morning. Denies abd pain, pelvic pressure vaginal bleeding, vaginal discharge, urinary complaints, GI complains. No recent IC. Was at her OB office this AM and they did a pelvic exam, states fern slides were inconclusive and was sent to MAU for further evaluation.   Gets parental care at [facility]. Dr. Lanny Cramp is her primary. Pregnancy complicated marginal cord insertion and possible velamentous insertion vs vasa previa and 6 cm [place]. MFM [place] 11/17/20: Est. FW:  416  gm  0 lb 15 oz  32  %, Marginal cord insertion. Could not rule out velamentous insertion. CL 3.75. Nml fluid.   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   Contractions: denies Vaginal bleeding: Denies Fetal movement: Active  Past Medical History:  Diagnosis Date   Allergy    Anxiety    just recently and seeing therapist.   Hemangioma of vertebral column    Migraines      O: Patient Vitals for the past 24 hrs:  BP Temp Temp src Pulse Resp SpO2 Height Weight  11/29/20 1205 137/74 98.9 F (37.2 C) Oral 75 16 98 % -- --  11/29/20 1159 -- -- -- -- -- -- 5\' 11"  (1.803 m) 101.8 kg   General: NAD Heart: Regular rate Lungs: Normal rate and effort Abd: Soft, NT, Gravid, S=D Pelvic: NEFG, grossly leaking of small amount of clear fluid. Internal exam deferred.     EFM: 150, Min-mod variability, 15 x 15 accelerations, few variable decelerations Toco: None  Positive Fern  MAU Course/MDM Discussed exam, ferm w/ Dr. Harolyn Rutherford who recommends admission to [facility].   - PPROM, no evidence of labor. Plan Latency ABX, Growth [place], BMZ x 2.  -  Periviable. NICU consult planned. Pt is RT in NICU. Very aware of implication of preterm, periviable delivery.    A: [redacted]w[redacted]d week IUP PPROM, no evidence of labor FHR reactive Marginal cord insertion Possible velamentous insertion.   P: Admit to [facility] per consult w/ Azucena Fallen, MD. Discussed admission w/ NICU. Not currently full. Plan consult in afternoon Dr. Benjie Karvonen to assume care of pt.    Tamala Julian, [place], [place] 11/29/2020 12:16 PM  2

## 2020-11-30 DIAGNOSIS — Z3A23 23 weeks gestation of pregnancy: Secondary | ICD-10-CM | POA: Diagnosis not present

## 2020-11-30 DIAGNOSIS — O42112 Preterm premature rupture of membranes, onset of labor more than 24 hours following rupture, second trimester: Secondary | ICD-10-CM | POA: Diagnosis not present

## 2020-11-30 DIAGNOSIS — O99892 Other specified diseases and conditions complicating childbirth: Secondary | ICD-10-CM | POA: Diagnosis not present

## 2020-11-30 DIAGNOSIS — D1809 Hemangioma of other sites: Secondary | ICD-10-CM | POA: Diagnosis not present

## 2020-11-30 NOTE — Progress Notes (Signed)
Mikayla Hicks 35 y.o. G1P0 at [redacted]w[redacted]d HD#2 admitted with PPROM on 6/21  Patient did well overnight. Reports feeling some smaller amounts of leaking, mostly after using the bathroom. Fluid is still clear, denies any bleeding. No abdominal pain, cramping, or ctxs. Good FM. No fevers/chills. No N/V, tolerating regular diet. Was able to get some sleep overnight. Has no additional concerns.   We discussed patient's spinal hemangioma history further. Had not been able to meet with Neurosurgeon locally yet, as she thought she had more time in the pregnancy for delivery planning. Has requested records from [facility] where she had her original surgery. States that she was told to have monitored if she develops symptoms such as the ones she originally presented with (back pain, clumsiness, uncoordinated walking, etc.) and if MRI had shown growth then may consider avoiding strain of labor at that point. Last MRI done in [place] in 2010 was done as routine follow up, had no growth, and told to monitor every 10 years or sooner for symptoms.    O: Vitals:   11/29/20 1957 11/30/20 0237 11/30/20 0243 11/30/20 0743  BP: 125/73 (!) 107/53 115/63 105/64  Pulse: 74 73 73 71  Resp: 18 17 17 18   Temp: 97.9 F (36.6 C) (!) 97.5 F (36.4 C) 97.8 F (36.6 C) 98.2 F (36.8 C)  TempSrc: Oral Oral Oral Oral  SpO2: 100% 100%  100%  Weight:      Height:       General: AAO, NAD Cardiac: regular rate Respiratory: normal effort non-labored breathing Abdomen: gravid uterus c/w GA, non-tender Ext: no evidence of DVT  Fetal Monitoring: NST: reactive for GA, baseline 150 bpm mod var +10x10 accels, no decels Toco: acontractile  A//P: [name] 35 y.o. G1P0 at [redacted]w[redacted]d HD#2 admitted with PPROM on 6/21, currently stable without signs of labor or infection  PPROM -Latency antibiotics (Day 2 of 7) transition to PO Amoxicillin and Erythromycin after completing 48hr of IV Ampicillin and Erythromycin -S/p  BMZ #1 6/21 at 2000, plan for 2nd dose tonight  -S/p MFM consult and [place] on admission: long closed cervix, breech and funic presentation with MCI. Plan for repeat US next week -NICU consult pending -Afebrile with WBC of 12.4 on admission. Cont to monitor temps and CBC q 72hr  Fetal Monitoring -NST 30 min monitoring q shift. Reactive this morning -Transition to Toco monitoring q shift with NST since no uterine activity since admission  History Spinal Hemangioma -Discussed with patient currently would require cesarean section regardless of history d/t breech and funic presentation, low chances of position change due to low amniotic fluid volume, but possible -Request for records by office made to follow up -Encourage patient to notify care team of any changes in neuro symptoms  SCD VTE ppx  Routine antepartum care. Continue inpatient admission with goal delivery after 34.0 weeks   Ranay Ketter A Allora Bains 11/30/20 12:19 PM

## 2020-12-01 DIAGNOSIS — O42112 Preterm premature rupture of membranes, onset of labor more than 24 hours following rupture, second trimester: Secondary | ICD-10-CM | POA: Diagnosis not present

## 2020-12-01 DIAGNOSIS — Z3A24 24 weeks gestation of pregnancy: Secondary | ICD-10-CM | POA: Diagnosis not present

## 2020-12-01 NOTE — Consult Note (Addendum)
Mikayla Hicks  Prenatal Consult       12/01/2020  10:37 PM   I was asked by Dr. Pamala Hurry to consult on this patient for possible preterm delivery. I had the pleasure of meeting with Mikayla Hicks and her husband today. She is a 35 year old G1P0 woman at [redacted]w[redacted]d gestation today. EFW was 525g (14%) on 11/29/20. Pregnancy had been largely uncomplicated to this point, other than marginal cord insertion and subchorionic hemorrhage. She experienced PPROM 48 hours ago on 11/29/20, for which she is currently admitted. She has received two doses of betamethasone, most recently yesterday, and is receiving latency antibiotics. At this point, she is not having any signs of labor or infection. The plan is for continued inpatient management and monitoring with the hope of getting to at least 34 weeks' gestation before delivery. They are expecting a baby boy named Mikayla Hicks. The anticipated mode of delivery is cesarean section, due to maternal history of spinal hemangioma.This is Mikayla Hicks's first baby; her husband has two children from a prior marriage who were born full term and are healthy. 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 Her husband works as a Radio broadcast assistant.  We discussed that at 24 weeks, she is considered to be in the periviable stage of pregnancy. I provided an overview of the care expected at this gestational age and the possible complications and explained that prior to 25 weeks, they have the option to make decisions about the degree of resuscitation provided, ranging from comfort care to all measures necessary to facilitate survival. I also explained that if they are unsure, the NICU team will provide resuscitation and discuss with the family if he is not responding to treatment and we will provide recommendations. We discussed that no decisions need to be made right now, and that hopefully they will not need to make such a decision if the pregnancy is able to safely  continue.   I explained that the neonatal intensive care team would be present for the delivery and outlined the likely delivery room course for this baby including routine resuscitation and NRP-guided approaches to the treatment of respiratory distress including the possibility of needing CPAP vs intubation and the likely need for surfactant. We discussed other common problems associated with prematurity including respiratory distress syndrome/CLD, apnea, feeding issues, temperature regulation, and infection risk. We discussed IVH/PVL, ROP, and NEC and that these are complications associated with prematurity and that with each passing day/week of gestation, the risks of these complications become smaller. We discussed the relatively high risk of these complications at the current gestational age. We briefly discussed neurodevelopmental outcomes at this age and at later gestational ages.  We discussed the average length of stay but I noted that the actual LOS would depend on the severity of problems encountered and response to treatments. We discussed visitation policies and the resources available while their child is in the hospital.  We discussed the importance of good nutrition and various methods of providing nutrition (parenteral hyperalimentation, gavage feedings and/or oral feeding). [name] intends to breast feed/provide breast milk. We discussed the benefits of human milk. I encouraged pumping soon after birth and outlined resources that are available to support breast feeding. We discussed the possibility of using donor breast milk as a bridge, to which they are amenable.  Thank you for involving [place] in the care of this patient. A member of our team will be available should the family have additional questions. Time  for consultation approximately 40 minutes with 50% of that time in face-to-face consultation regarding prematurity, its possible associated complications, and the anticipated NICU  course.  Renato Shin, MD Neonatal Medicine

## 2020-12-01 NOTE — Progress Notes (Signed)
Mikayla Hicks 35 y.o. G1P0 at [redacted]w[redacted]d HD#3 admitted with PPROM on 6/21  Patient did well overnight. Small trickle of clear, non-bloody, non-odorous fluid. No ctx. Good FM. No fevers or chills. Normal BM and void. No CP, SOB, HA.  Pt has gotten contact info for her prior MRI/ neuro consults in [place]   O: Vitals:   12/01/20 0338 12/01/20 0818 12/01/20 0819 12/01/20 1116  BP: 122/61  116/64 129/73  Pulse: 84  78 87  Resp: 16  17 18   Temp: 98.1 F (36.7 C) 98 F (36.7 C)  98.2 F (36.8 C)  TempSrc: Oral Oral  Oral  SpO2: 98% 99%    Weight:      Height:       General: AAO, NAD Abdomen: gravid uterus c/w GA, non-tender GU: no blood/ staining on pad Ext: no evidence of DVT  Fetal Monitoring: NST: reactive for GA, baseline 150 bpm mod var +10x10 accels, no decels Toco: none  A//P: [name] 35 y.o. G1P0 at [redacted]w[redacted]d HD#3 admitted with PPROM on 6/21, currently stable without signs of labor or infection  PPROM -Latency antibiotics (Day 3of 7) transition to PO Amoxicillin and Erythromycin after completing 48hr of IV Ampicillin and Erythromycin -S/p BMZ #2 on 6/22.  May consider repeat dose in 3-4 wks.  -S/p MFM consult and [place] on admission: long closed cervix, breech and funic presentation with MCI. Plan for repeat US next week to eval position/ cord.  -NICU consult pending -Afebrile with WBC of 12.4 on admission. Cont to monitor temps and CBC q 72hr  Fetal Monitoring -Transition to Toco monitoring q shift with NST since no uterine activity since admission  History Spinal Hemangioma -Discussed with patient currently would require cesarean section regardless of history d/t breech and funic presentation, low chances of position change due to low amniotic fluid volume, but possible. Anasthesia aware of spinal history and feels OK for spinal/ epidural given prior lesion at thoracic level. Will try to get records.   SCD VTE ppx- SCDs  Routine antepartum care. Continue inpatient  admission with goal delivery after 34.0 weeks   [name] 12/01/20 11:57 AM

## 2020-12-02 DIAGNOSIS — O42112 Preterm premature rupture of membranes, onset of labor more than 24 hours following rupture, second trimester: Secondary | ICD-10-CM | POA: Diagnosis not present

## 2020-12-02 DIAGNOSIS — Z3A24 24 weeks gestation of pregnancy: Secondary | ICD-10-CM | POA: Diagnosis not present

## 2020-12-02 LAB — CBC WITH DIFFERENTIAL/PLATELET
Abs Immature Granulocytes: 0.33 10*3/uL — ABNORMAL HIGH (ref 0.00–0.07)
Basophils Absolute: 0.1 10*3/uL (ref 0.0–0.1)
Basophils Relative: 0 %
Eosinophils Absolute: 0 10*3/uL (ref 0.0–0.5)
Eosinophils Relative: 0 %
HCT: 34.1 % — ABNORMAL LOW (ref 36.0–46.0)
Hemoglobin: 11.4 g/dL — ABNORMAL LOW (ref 12.0–15.0)
Immature Granulocytes: 2 %
Lymphocytes Relative: 18 %
Lymphs Abs: 2.4 10*3/uL (ref 0.7–4.0)
MCH: 27.9 pg (ref 26.0–34.0)
MCHC: 33.4 g/dL (ref 30.0–36.0)
MCV: 83.6 fL (ref 80.0–100.0)
Monocytes Absolute: 1 10*3/uL (ref 0.1–1.0)
Monocytes Relative: 7 %
Neutro Abs: 9.9 10*3/uL — ABNORMAL HIGH (ref 1.7–7.7)
Neutrophils Relative %: 73 %
Platelets: 245 10*3/uL (ref 150–400)
RBC: 4.08 MIL/uL (ref 3.87–5.11)
RDW: 13.1 % (ref 11.5–15.5)
WBC: 13.6 10*3/uL — ABNORMAL HIGH (ref 4.0–10.5)
nRBC: 0 % (ref 0.0–0.2)

## 2020-12-02 LAB — TYPE AND SCREEN
ABO/RH(D): A POS
Antibody Screen: NEGATIVE

## 2020-12-02 NOTE — Progress Notes (Signed)
Mikayla Hicks 35 y.o. G1P0 at [redacted]w[redacted]d HD#4 admitted with PPROM on 6/21  Patient did well overnight. Small trickle of clear, non-bloody, non-odorous fluid. No ctx. Good FM. No fevers or chills. Normal BM and void. No CP, SOB, HA.  Pt has gotten contact info for her prior MRI/ neuro consults in [place]   O: Vitals:   12/01/20 2003 12/01/20 2313 12/02/20 0528 12/02/20 0801  BP: 117/63 (!) 112/58 (!) 107/49 115/60  Pulse: 70 67 68 81  Resp: 16 18 18 18   Temp: 98.2 F (36.8 C) 98.1 F (36.7 C) 97.9 F (36.6 C) 98.1 F (36.7 C)  TempSrc: Oral Oral Oral Oral  SpO2: 100% 100% 100% 100%  Weight:      Height:       General: AAO, NAD Neck: supple Lungs:CTA CV: RRR Abdomen: gravid uterus c/w GA, non-tender GU: no blood/ staining on pad Ext: no evidence of DVT Neuro: non focal Skin: intact  Fetal Monitoring: NST: reactive for GA, baseline 150 bpm mod var +10x10 accels, no decels Toco: none  A//P: Mikayla Hicks 35 y.o. G1P0 at [redacted]w[redacted]d HD#4 admitted with PPROM on 6/21, currently stable without signs of labor or infection  PPROM -Latency antibiotics (Day 4of 7) transition to PO Amoxicillin and Erythromycin after completing 48hr of IV Ampicillin and Erythromycin -S/p BMZ #2 on 6/22.  May consider repeat dose in 3-4 wks.  -S/p MFM consult and [place] on admission: long closed cervix, breech and funic presentation with MCI. Plan for repeat US next week to eval position/ cord.  -NICU consult done -Afebrile with WBC of 12.4 on admission. Cont to monitor temps and CBC q 72hr  Fetal Monitoring -Toco monitoring q shift with NST since no uterine activity since admission  History Spinal Hemangioma -Discussed with patient currently would require cesarean section regardless of history d/t breech and funic presentation, low chances of position change due to low amniotic fluid volume, but possible. Anasthesia aware of spinal history and feels OK for spinal/ epidural given prior lesion at thoracic level.  Will try to get records.   SCD VTE ppx- SCDs  Routine antepartum care. Continue inpatient admission with goal delivery after 34.0 weeks   Mikayla Hicks J 12/02/20 9:42 AM

## 2020-12-03 DIAGNOSIS — O42112 Preterm premature rupture of membranes, onset of labor more than 24 hours following rupture, second trimester: Secondary | ICD-10-CM | POA: Diagnosis not present

## 2020-12-03 DIAGNOSIS — Z3A24 24 weeks gestation of pregnancy: Secondary | ICD-10-CM | POA: Diagnosis not present

## 2020-12-03 LAB — CULTURE, BETA STREP (GROUP B ONLY)

## 2020-12-03 NOTE — Progress Notes (Signed)
HD 5 PPROM, 24.2wga  Feels fluid leaking with FM, no bleeding/odor; no abd pain; feels fine, good spirits  Temp:  [97.9 F (36.6 C)-98.1 F (36.7 C)] 98.1 F (36.7 C) (06/25 0817) Pulse Rate:  [71-80] 80 (06/25 0817) Resp:  [17-18] 17 (06/25 0817) BP: (108-124)/(64-69) 112/68 (06/25 0817) SpO2:  [97 %-100 %] 100 % (06/25 0817)  A&ox3 Nml respirations Abd: soft, nt, gravid Cx: deferred LE: no edema, nt bilat  CBC Latest Ref Rng & Units 12/02/2020 11/29/2020 09/18/2020  WBC 4.0 - 10.5 K/uL 13.6(H) 12.4(H) 13.2(H)  Hemoglobin 12.0 - 15.0 g/dL 11.4(L) 12.9 13.7  Hematocrit 36.0 - 46.0 % 34.1(L) 38.8 40.6  Platelets 150 - 400 K/uL 245 285 254   Fht: 150s, normal variability; about 4pm yesterday: there was a spontaneous deceleration about 2 min to 130s then back to baseline, and then accels noted after; no further decels in monitoring; periods of accels noted (10x10 and even 15x15); reassuring for gestational age Toco: no ctx  U/s on admission: long closed cervix, breech and funic presentation with MCI. Plan for repeat US next week to eval position/ cord.  A/P: iup at 24.2 wga PPROM - d5/7 abx, s/p 2d IV amp/erythromycin, now oral amoxicillin/erythromycin; continue to monitor closely for chorioamnionitis; afebrile,exam wnl; wbc with slight increase but stable (stable from 09/18/20), contin q labs 3 days 2. Prematue/periviable: s/p bmz x2 (completed 6/22), consider repeat in 3-4 wk; s/p NICU consult 3. Breech/funic presentation - plan u/s if laboring to re-evaluate presentation (though low likelihood for change d/t low fluid); plan for c/s currently 4. Fetal status: ressuring; nst q shift 5.History Spinal Hemangioma:  Anasthesia aware of spinal history and OK for spinal/ epidural given prior lesion at thoracic level. Records pending from mri/neuro [facility]) 6. Plan inpatient, goal delivery after 34wga

## 2020-12-04 DIAGNOSIS — O42112 Preterm premature rupture of membranes, onset of labor more than 24 hours following rupture, second trimester: Secondary | ICD-10-CM | POA: Diagnosis not present

## 2020-12-04 DIAGNOSIS — Z3A24 24 weeks gestation of pregnancy: Secondary | ICD-10-CM | POA: Diagnosis not present

## 2020-12-04 NOTE — Progress Notes (Signed)
HD 6 PPROM 24.3 wga  Noticed trickling this am, no vb, +FM; no ctx   Patient Vitals for the past 24 hrs:  BP Temp Temp src Pulse Resp SpO2  12/04/20 0748 113/66 98 F (36.7 C) Oral 71 18 100 %  12/04/20 0553 116/70 98.2 F (36.8 C) Oral 72 17 100 %  12/03/20 2210 115/64 98.2 F (36.8 C) Oral 74 16 99 %  12/03/20 1957 121/70 98 F (36.7 C) Oral 79 17 99 %  12/03/20 1600 120/72 98 F (36.7 C) Oral 88 18 99 %  12/03/20 1221 125/63 97.8 F (36.6 C) Oral 84 18 99 %   A&ox3 Nml respirations Abd: soft, nt, nd; gravid LE: no edema, nt bilat  FHT: 150s, nml variability, +accels (10x10, few 15x15), reassuring for gestational age TOCO: no ctx  U/s on admission: long closed cervix, breech and funic presentation with MCI. Plan for repeat US next week to eval position/ cord.   A/P: iup at 24.2 wga PPROM - d67 abx, s/p 2d IV amp/erythromycin, now oral amoxicillin/erythromycin; continue to monitor closely for chorioamnionitis; afebrile,exam wnl; contin q labs 3 days; repeat u/s 6/28 (scheduled); pt has been stable, ok for short wheelchair ride within unit, can recline in chair 2. Prematue/periviable: s/p bmz x2 (completed 6/22), consider repeat in 3-4 wk; s/p NICU consult 3. Breech/funic presentation - plan u/s if laboring to re-evaluate presentation (though low likelihood for change d/t low fluid); plan for c/s currently 4. Fetal status: ressuring; nst q shift 5.History Spinal Hemangioma:  Anasthesia aware of spinal history and OK for spinal/ epidural given prior lesion at thoracic level. Records pending from mri/neuro [facility]) 6. Plan inpatient, goal delivery after 34wga  7. GBS positive 8. Dvt prophylaxis: scd's

## 2020-12-04 NOTE — Plan of Care (Signed)
Continues to have little trickles of clear amniotic fluid has changed her pads just to be clean. States fluid is clear w/o odor.Denies any cramping and has remained afebrile.

## 2020-12-05 DIAGNOSIS — O42112 Preterm premature rupture of membranes, onset of labor more than 24 hours following rupture, second trimester: Secondary | ICD-10-CM | POA: Diagnosis not present

## 2020-12-05 DIAGNOSIS — Z3A24 24 weeks gestation of pregnancy: Secondary | ICD-10-CM | POA: Diagnosis not present

## 2020-12-05 LAB — CBC WITH DIFFERENTIAL/PLATELET
Abs Immature Granulocytes: 0.34 10*3/uL — ABNORMAL HIGH (ref 0.00–0.07)
Basophils Absolute: 0.1 10*3/uL (ref 0.0–0.1)
Basophils Relative: 0 %
Eosinophils Absolute: 0.2 10*3/uL (ref 0.0–0.5)
Eosinophils Relative: 1 %
HCT: 36.7 % (ref 36.0–46.0)
Hemoglobin: 12 g/dL (ref 12.0–15.0)
Immature Granulocytes: 3 %
Lymphocytes Relative: 21 %
Lymphs Abs: 2.9 10*3/uL (ref 0.7–4.0)
MCH: 27.5 pg (ref 26.0–34.0)
MCHC: 32.7 g/dL (ref 30.0–36.0)
MCV: 84.2 fL (ref 80.0–100.0)
Monocytes Absolute: 1 10*3/uL (ref 0.1–1.0)
Monocytes Relative: 7 %
Neutro Abs: 9.5 10*3/uL — ABNORMAL HIGH (ref 1.7–7.7)
Neutrophils Relative %: 68 %
Platelets: 258 10*3/uL (ref 150–400)
RBC: 4.36 MIL/uL (ref 3.87–5.11)
RDW: 13.2 % (ref 11.5–15.5)
WBC: 13.8 10*3/uL — ABNORMAL HIGH (ref 4.0–10.5)
nRBC: 0 % (ref 0.0–0.2)

## 2020-12-05 LAB — TYPE AND SCREEN
ABO/RH(D): A POS
Antibody Screen: NEGATIVE

## 2020-12-05 NOTE — Progress Notes (Signed)
Mikayla Hicks 35 y.o. G1P0 at [redacted]w[redacted]d HD#7 admitted with PPROM on 6/21  S: Patient doing well in good spirits this morning. She has been feeling physically well with no complaints. Reports small LOF clear gushes mostly with baby's movements/increased during morning and evening. Denies any foul odor, discoloration, or blood present. Denies any cramping or CTXs. No abd pain, N/V, fever/chills. Good FM. Fiance at bedside.  O: Vitals:   12/04/20 2200 12/05/20 0550 12/05/20 0727 12/05/20 1123  BP: (!) 124/57 113/65 125/62 128/69  Pulse: 73 78 80 81  Resp: 16 18 18 18   Temp: 98.1 F (36.7 C) 98.2 F (36.8 C) 98.3 F (36.8 C) 98.3 F (36.8 C)  TempSrc: Oral Oral Oral Oral  SpO2: 97% 100% 98% 100%  Weight:      Height:       General: AAO, NAD Heart: normal rate Lungs: normal non-labored breathing Abdomen: gravid uterus c/w GA, non-tender to palpation Ext: neg LE edema or tenderness to calf palpation  Fetal Monitoring:  NST: reactive for GA this morning, baseline 150 bpm +10x10 accels, -decels Toco: acontractile  A/p: [name] 35 y.o. G1P0 at [redacted]w[redacted]d HD#7 admitted with PPROM on 6/21, now clinically stable   PPROM -Latency antibiotics (Day 7/7) -S/p BMZ 6/21-6/22 -S/p MFM consult and [place] on admission: long closed cervix, breech and funic presentation with MCI. Plan for repeat US scheduled for tomorrow 6/28 -Afebrile with WBC of 13.8 today. Cont to monitor temps and CBC q 72hr   Fetal Monitoring -NST 30 min monitoring q shift. Reactive this morning   History Spinal Hemangioma -s/p Anesthesia consultation. Ok for spinal    SCD VTE ppx   Routine antepartum care. Continue inpatient admission with goal delivery after 34.0 weeks   Kaisha Wachob A Melondy Blanchard 12/05/20 1:31 PM

## 2020-12-06 ENCOUNTER — Inpatient Hospital Stay (HOSPITAL_BASED_OUTPATIENT_CLINIC_OR_DEPARTMENT_OTHER): Payer: 59

## 2020-12-06 DIAGNOSIS — O321XX0 Maternal care for breech presentation, not applicable or unspecified: Secondary | ICD-10-CM | POA: Diagnosis not present

## 2020-12-06 DIAGNOSIS — O42912 Preterm premature rupture of membranes, unspecified as to length of time between rupture and onset of labor, second trimester: Secondary | ICD-10-CM | POA: Diagnosis not present

## 2020-12-06 DIAGNOSIS — Z3A24 24 weeks gestation of pregnancy: Secondary | ICD-10-CM

## 2020-12-06 DIAGNOSIS — O42112 Preterm premature rupture of membranes, onset of labor more than 24 hours following rupture, second trimester: Secondary | ICD-10-CM | POA: Diagnosis not present

## 2020-12-06 NOTE — Progress Notes (Signed)
Initial Nutrition Assessment  DOCUMENTATION CODES:  Not applicable  INTERVENTION:  Regular diet Pt may order double protein portions and snacks TID if she makes request when ordering meals   NUTRITION DIAGNOSIS:   Increased nutrient needs related to  (pregnancy and fetal growth requirements) as evidenced by  (24 weeks IUP).  GOAL:   Patient will meet greater than or equal to 90% of their needs, Weight gain  MONITOR:   Weight trends  REASON FOR ASSESSMENT:   Antenatal, LOS   ASSESSMENT:   Now 24 5/7 weeks IUP, adm due to PROM on 6/21. pre-preg weight est at 93 kg, w/ BMI of 28.6   Wt at 10 weeks 102.7 kg - no weight gain since  Diet Order:   Diet Order             Diet regular Room service appropriate? Yes; Fluid consistency: Thin  Diet effective now                  EDUCATION NEEDS:   No education needs have been identified at this time  Skin:  Skin Assessment: Reviewed RN Assessment    Height:   Ht Readings from Last 1 Encounters:  11/29/20 5\' 11"  (1.803 m)    Weight:   Wt Readings from Last 1 Encounters:  11/29/20 101.8 kg    Ideal Body Weight:   155 lbs  BMI:  Body mass index is 31.3 kg/m.  Estimated Nutritional Needs:   Kcal:  2200-2400  Protein:  98-108 g  Fluid:  2.5 L

## 2020-12-06 NOTE — Progress Notes (Addendum)
Mikayla Hicks 35 y.o. G1P0 at [redacted]w[redacted]d  HD# 8  PPROM on 6/21  S: Patient doing well. Aware of reportable s/s. No complaints. Reports small LOF -clear gushes mostly with baby's movements. . Denies any foul odor, discoloration, or blood. Denies any cramping or CTXs. No abd pain, N/V, fever/chills. Good FM.   O: Vitals:   12/05/20 2156 12/06/20 0557 12/06/20 0911 12/06/20 1224  BP: 127/65 126/65 129/71 119/66  Pulse: 78 77 80 79  Resp: 17 19 18 18   Temp: 97.8 F (36.6 C) 98.2 F (36.8 C) 98.2 F (36.8 C) 97.9 F (36.6 C)  TempSrc: Oral Oral Oral Oral  SpO2: 99% 99% 98% 96%  Weight:      Height:       General: AAO, NAD Heart: normal rate Lungs: normal non-labored breathing Abdomen: gravid uterus 2 cm above U, non-tender to palpation Ext: neg LE edema,Homan's neg. Wears SCDs most of the times  Fetal Monitoring: NST: appropriate for 24 wks, baseline 150 bpm +10x10 accels, -decels Toco: acontractile  A/p: Mikayla Hicks 35 y.o. G1P0 at [redacted]w[redacted]d, with PPROM on 6/21, clinically stable   PPROM -Latency antibiotics completed , no clinical s/s of infection -S/p BMZ 6/21-6/22 -S/p MFM consult. Limited sono today, closed cervix, breech with funic presentation with MCI. Reviewed labor and report to RN immediately for speculum exam if suspect labor . C/s delivery likely, reviewed cord prolapse risk  -Afebrile with WBC of 13.8 on 6/27, repeat in 72 hrs.    Fetal Monitoring -NST 30 min monitoring q shift. Reactive this morning   History Spinal Hemangioma -s/p Anesthesia consultation. Ok for spinal    SCD VTE ppx   Routine antepartum care. Continue inpatient admission with goal delivery after 34.0 weeks   [name] 12/06/20 2:18 PM

## 2020-12-07 DIAGNOSIS — Z3A24 24 weeks gestation of pregnancy: Secondary | ICD-10-CM | POA: Diagnosis not present

## 2020-12-07 DIAGNOSIS — O42112 Preterm premature rupture of membranes, onset of labor more than 24 hours following rupture, second trimester: Secondary | ICD-10-CM | POA: Diagnosis not present

## 2020-12-07 NOTE — Progress Notes (Addendum)
Mikayla Hicks 35 y.o. G1P0 at [redacted]w[redacted]d  HD# 9  PPROM on 6/21  S: Patient doing well. No complaints. Reports small LOF -clear gushes mostly with baby's movements. . Denies any foul odor, discoloration, or blood. Denies any cramping or CTXs. No abd pain, N/V, fever/chills. Good FM.   O: Vitals:   12/06/20 1224 12/06/20 1537 12/06/20 1925 12/07/20 0526  BP: 119/66 121/63 126/78 126/73  Pulse: 79 79 90 85  Resp: 18 18 18 18   Temp: 97.9 F (36.6 C) 98.1 F (36.7 C) 98.1 F (36.7 C) 97.9 F (36.6 C)  TempSrc: Oral Oral Oral Oral  SpO2: 96% 98%  98%  Weight:      Height:       General: AAO, NAD Heart: RRR Lungs: CTA, non-labored breathing Abdomen: gravid uterus S+D,non-tender to palpation Ext: neg LE edema,Homan's neg. Wears SCDs most of the times Neuro: nonfocal Skin: intact  Fetal Monitoring: NST: appropriate for 24 wks, baseline 150 bpm +10x10 accels, -decels Toco: rare contractions Category 1 tracing  Sono c/w AGA, (14th percentile), oligo, breech , funic presentation  A/p: ROMELLE MULDOON 35 y.o. G1P0 at [redacted]w[redacted]d, with PPROM on 6/21, no s/s chorio  PPROM -Latency antibiotics completed , no clinical s/s of infection -S/p BMZ 6/21-6/22 -S/p MFM sono- reults noted   Fetal Monitoring -NST 30 min monitoring q shift. Reactive this morning   History Spinal Hemangioma -s/p Anesthesia consultation. Ok for spinal    4. Breech with funic presentation   Routine antepartum care. Continue inpatient admission with goal delivery after 34.0 weeks   [name] 12/07/20 8:59 AM

## 2020-12-08 DIAGNOSIS — O42112 Preterm premature rupture of membranes, onset of labor more than 24 hours following rupture, second trimester: Secondary | ICD-10-CM | POA: Diagnosis not present

## 2020-12-08 DIAGNOSIS — Z3A25 25 weeks gestation of pregnancy: Secondary | ICD-10-CM | POA: Diagnosis not present

## 2020-12-08 LAB — CBC WITH DIFFERENTIAL/PLATELET
Abs Immature Granulocytes: 0.2 10*3/uL — ABNORMAL HIGH (ref 0.00–0.07)
Basophils Absolute: 0.1 10*3/uL (ref 0.0–0.1)
Basophils Relative: 1 %
Eosinophils Absolute: 0.1 10*3/uL (ref 0.0–0.5)
Eosinophils Relative: 1 %
HCT: 33.8 % — ABNORMAL LOW (ref 36.0–46.0)
Hemoglobin: 10.9 g/dL — ABNORMAL LOW (ref 12.0–15.0)
Immature Granulocytes: 2 %
Lymphocytes Relative: 22 %
Lymphs Abs: 2.5 10*3/uL (ref 0.7–4.0)
MCH: 27.5 pg (ref 26.0–34.0)
MCHC: 32.2 g/dL (ref 30.0–36.0)
MCV: 85.1 fL (ref 80.0–100.0)
Monocytes Absolute: 0.9 10*3/uL (ref 0.1–1.0)
Monocytes Relative: 8 %
Neutro Abs: 7.7 10*3/uL (ref 1.7–7.7)
Neutrophils Relative %: 66 %
Platelets: 243 10*3/uL (ref 150–400)
RBC: 3.97 MIL/uL (ref 3.87–5.11)
RDW: 13 % (ref 11.5–15.5)
WBC: 11.6 10*3/uL — ABNORMAL HIGH (ref 4.0–10.5)
nRBC: 0 % (ref 0.0–0.2)

## 2020-12-08 LAB — TYPE AND SCREEN
ABO/RH(D): A POS
Antibody Screen: NEGATIVE

## 2020-12-08 MED ORDER — FERROUS SULFATE 325 (65 FE) MG PO TABS
325.0000 mg | ORAL_TABLET | Freq: Every day | ORAL | Status: DC
  Administered 2020-12-09 – 2021-02-03 (×57): 325 mg via ORAL
  Filled 2020-12-08 (×57): qty 1

## 2020-12-08 NOTE — Progress Notes (Signed)
HD 10 25.0wga, PPROM (6/21)  No vb, continues to have trickling of fluid, no ctx; +FM No f/c/s; feels fine, good spirits  Patient Vitals for the past 24 hrs:  BP Temp Temp src Pulse Resp SpO2  12/08/20 0723 117/60 98.2 F (36.8 C) Oral 76 18 100 %  12/08/20 0603 (!) 110/58 98 F (36.7 C) Oral 69 17 99 %  12/07/20 2259 (!) 116/55 97.9 F (36.6 C) Oral 69 17 99 %  12/07/20 1916 132/69 97.8 F (36.6 C) Oral 77 18 100 %  12/07/20 1605 118/67 98.1 F (36.7 C) Oral 79 17 100 %  12/07/20 1128 132/69 98.1 F (36.7 C) Oral 88 17 --   A&ox3 Nml respirations Abd: soft,nt,nt, gravid LE: no edema, nt bilat  FHT: 140s, nml variability, +accels, occ small variables, 1 variable to 90s last night; reassuring for gestational age TOCO: no ctx  Results for orders placed or performed during the hospital encounter of 11/29/20 (from the past 24 hour(s))  CBC with Differential/Platelet     Status: Abnormal   Collection Time: 12/08/20  6:39 AM  Result Value Ref Range   WBC 11.6 (H) 4.0 - 10.5 K/uL   RBC 3.97 3.87 - 5.11 MIL/uL   Hemoglobin 10.9 (L) 12.0 - 15.0 g/dL   HCT 33.8 (L) 36.0 - 46.0 %   MCV 85.1 80.0 - 100.0 fL   MCH 27.5 26.0 - 34.0 pg   MCHC 32.2 30.0 - 36.0 g/dL   RDW 13.0 11.5 - 15.5 %   Platelets 243 150 - 400 K/uL   nRBC 0.0 0.0 - 0.2 %   Neutrophils Relative % 66 %   Neutro Abs 7.7 1.7 - 7.7 K/uL   Lymphocytes Relative 22 %   Lymphs Abs 2.5 0.7 - 4.0 K/uL   Monocytes Relative 8 %   Monocytes Absolute 0.9 0.1 - 1.0 K/uL   Eosinophils Relative 1 %   Eosinophils Absolute 0.1 0.0 - 0.5 K/uL   Basophils Relative 1 %   Basophils Absolute 0.1 0.0 - 0.1 K/uL   Immature Granulocytes 2 %   Abs Immature Granulocytes 0.20 (H) 0.00 - 0.07 K/uL  Type and screen [place]     Status: None   Collection Time: 12/08/20  6:39 AM  Result Value Ref Range   ABO/RH(D) A POS    Antibody Screen NEG    Sample Expiration      12/11/2020,2359 Performed at [facility], [address] [place], [place] [address]      U/S 6/28:  breech, funic presentation with mci Aga, (14%) oligo  A/P: 35 y.o. G1P0 at [redacted]w[redacted]d, with PPROM on 6/21, no s/s chorio  PPROM -s/p latency antibiotics, no clinical s/s of infection; wbc 11.6 today, repeat 3 days; pt understands chorio/labor precautions -S/p BMZ 6/21-6/22; s/p nicu consult -S/p MFM consult and u/s   Fetal Monitoring -NST 30 min monitoring q shift, reassuring for gestational age   History Spinal Hemangioma -s/p Anesthesia consultation. [place] for spinal; records still not received from [facility], having nursing f/u, pt will also call to f/u   4. Breech with funic presentation, MCI; likely c/s d/t presentation   Routine antepartum care. Continue inpatient,goal delivery after 34.0 weeks  6. Anemia: begin iron daily  7. Contin scd for dvt propohylaxis

## 2020-12-09 ENCOUNTER — Encounter (HOSPITAL_COMMUNITY): Payer: Self-pay | Admitting: Obstetrics & Gynecology

## 2020-12-09 DIAGNOSIS — O42112 Preterm premature rupture of membranes, onset of labor more than 24 hours following rupture, second trimester: Secondary | ICD-10-CM | POA: Diagnosis not present

## 2020-12-09 DIAGNOSIS — Z3A25 25 weeks gestation of pregnancy: Secondary | ICD-10-CM | POA: Diagnosis not present

## 2020-12-09 NOTE — Progress Notes (Signed)
HD 11 25.1 wga, PPROM (6/21)  No vb, continues to have trickling of fluid, no ctx; +FM No f/c/s; feels fine, good spirits  Patient Vitals for the past 24 hrs:  BP Temp Temp src Pulse Resp SpO2  12/09/20 1117 136/73 98.3 F (36.8 C) Oral 90 18 100 %  12/09/20 0742 112/70 98.2 F (36.8 C) Oral 90 18 100 %  12/09/20 0436 116/62 97.9 F (36.6 C) Oral 73 18 100 %  12/08/20 1922 115/74 98.1 F (36.7 C) Oral 75 18 100 %  12/08/20 1549 114/62 98.3 F (36.8 C) Oral 76 18 100 %    A&ox3 Nml respirations Abd: soft,nt,nt, gravid LE: no edema, nt bilat  FHT: 140s, nml variability, +accels, occ small variables, 1 variable to 90s last night; reassuring for gestational age TOCO: no ctx  No results found for this or any previous visit (from the past 24 hour(s)).    U/S 6/28:  breech, funic presentation with mci Aga, (14%) oligo  A/P: 35 y.o. G1P0 at 25.1, with PPROM on 6/21, no s/s chorio  PPROM -s/p latency antibiotics, no clinical s/s of infection; wbc wnl, repeat 3 days; pt understands chorio/labor precautions -S/p BMZ 6/21-6/22; s/p nicu consult -S/p MFM consult and u/s   Fetal Monitoring -NST 30 min monitoring q shift, reassuring for gestational age   History Spinal Hemangioma -s/p Anesthesia consultation. [place] for spinal; records still not received from [facility], having nursing f/u, pt will also call to f/u   4. Breech with funic presentation, MCI; likely c/s d/t presentation   Routine antepartum care. Continue inpatient,goal delivery after 34.0 weeks  6. Anemia: begin iron daily  7. Contin scd for dvt prophylaxis  Azucena Fallen MD

## 2020-12-10 DIAGNOSIS — O42112 Preterm premature rupture of membranes, onset of labor more than 24 hours following rupture, second trimester: Secondary | ICD-10-CM | POA: Diagnosis not present

## 2020-12-10 DIAGNOSIS — Z3A25 25 weeks gestation of pregnancy: Secondary | ICD-10-CM | POA: Diagnosis not present

## 2020-12-10 NOTE — Progress Notes (Signed)
Mikayla Hicks 35 y.o. G1P0 at [redacted]w[redacted]d HD#12 admitted with PPROM on 6/21  S: Patient doing well in good spirits this morning. She has been feeling physically well with no major complaints. Does mention occasional episodes of pelvic pressure, brief only lasting a few seconds mostly when she gets up out of bed or standing. No pain in abdomen or back, no cramping, and no tightening. Denies any dysuria or pressure with urination. Reports small LOF clear gushes mostly with baby's movements/increased during morning. Denies any foul odor, discoloration, or blood present. No N/V, fever/chills. Good FM. Good spirits. Fiance at bedside.  O: Vitals:   12/09/20 1513 12/09/20 1912 12/09/20 2316 12/10/20 0833  BP: 126/68 129/78 126/78 116/65  Pulse: 80 81 84 81  Resp: 18 16 18 16   Temp: 98.1 F (36.7 C) 98.1 F (36.7 C) 97.8 F (36.6 C) 98.2 F (36.8 C)  TempSrc: Oral Oral Oral Oral  SpO2: 100% 98% 97% 100%  Weight:      Height:       Physical Exam:  General: AAO, NAD Heart: normal rate Lungs: normal non-labored breathing Abdomen: gravid uterus c/w GA, non-tender to palpation Ext: neg LE edema or tenderness to calf palpation   Fetal Monitoring:   NST: reactive for GA this morning, baseline 140 bpm +10x10 accels, -decels Toco: acontractile  A/p: Mikayla Hicks 35 y.o. G1P0 at [redacted]w[redacted]d HD#12 admitted with PPROM on 6/21, now stable with no signs of PTL or infection   PPROM -S/p Latency Antibiotics -S/p BMZ 6/21-6/22 -S/p MFM consult and f/u US earlier this week: 6/28 breech, funic presentation w/ MVP 2 cm -Afebrile with WBC of 11.6 on 6/30 Cont to monitor temps and CBC q 72hr -Reviewed round ligament pain, no evidence of CTXs or labor. Will send urine for culture to r/o UTI Fetal Monitoring -NST 30 min monitoring q shift. Reactive this morning History Spinal Hemangioma -s/p Anesthesia consultation. [place] for spinal- MRI records requested, pending chart SCD VTE ppx Routine antepartum care.  Continue inpatient admission with goal delivery after 34.0 weeks   Alesandro Stueve A Cordae Mccarey 12/10/20 10:34 AM

## 2020-12-11 DIAGNOSIS — Z3A25 25 weeks gestation of pregnancy: Secondary | ICD-10-CM | POA: Diagnosis not present

## 2020-12-11 DIAGNOSIS — O42112 Preterm premature rupture of membranes, onset of labor more than 24 hours following rupture, second trimester: Secondary | ICD-10-CM | POA: Diagnosis not present

## 2020-12-11 LAB — CBC WITH DIFFERENTIAL/PLATELET
Abs Immature Granulocytes: 0.22 10*3/uL — ABNORMAL HIGH (ref 0.00–0.07)
Basophils Absolute: 0 10*3/uL (ref 0.0–0.1)
Basophils Relative: 0 %
Eosinophils Absolute: 0.1 10*3/uL (ref 0.0–0.5)
Eosinophils Relative: 1 %
HCT: 34.8 % — ABNORMAL LOW (ref 36.0–46.0)
Hemoglobin: 11.4 g/dL — ABNORMAL LOW (ref 12.0–15.0)
Immature Granulocytes: 2 %
Lymphocytes Relative: 25 %
Lymphs Abs: 3.3 10*3/uL (ref 0.7–4.0)
MCH: 27.7 pg (ref 26.0–34.0)
MCHC: 32.8 g/dL (ref 30.0–36.0)
MCV: 84.5 fL (ref 80.0–100.0)
Monocytes Absolute: 1 10*3/uL (ref 0.1–1.0)
Monocytes Relative: 7 %
Neutro Abs: 8.7 10*3/uL — ABNORMAL HIGH (ref 1.7–7.7)
Neutrophils Relative %: 65 %
Platelets: 250 10*3/uL (ref 150–400)
RBC: 4.12 MIL/uL (ref 3.87–5.11)
RDW: 13.2 % (ref 11.5–15.5)
WBC: 13.2 10*3/uL — ABNORMAL HIGH (ref 4.0–10.5)
nRBC: 0 % (ref 0.0–0.2)

## 2020-12-11 LAB — CULTURE, OB URINE: Culture: <10000 — AB

## 2020-12-11 LAB — TYPE AND SCREEN
ABO/RH(D): A POS
Antibody Screen: NEGATIVE

## 2020-12-11 MED ORDER — LACTATED RINGERS IV SOLN: INTRAVENOUS | Status: DC

## 2020-12-11 MED ORDER — TETANUS-DIPHTH-ACELL PERTUSSIS 5-2.5-18.5 LF-MCG/0.5 IM SUSY: 0.5000 mL | PREFILLED_SYRINGE | Freq: Once | INTRAMUSCULAR | Status: DC

## 2020-12-11 MED ORDER — LACTATED RINGERS IV BOLUS
500.0000 mL | Freq: Once | INTRAVENOUS | Status: AC
  Administered 2020-12-11: 500 mL via INTRAVENOUS

## 2020-12-11 NOTE — Progress Notes (Signed)
Adding 1 hr glucola and TDAP

## 2020-12-11 NOTE — Progress Notes (Signed)
Mikayla Hicks 35 y.o. G1P0 at [redacted]w[redacted]d HD#13 admitted with PPROM since 6/21  S: Mikayla Hicks is doing well this morning. Continued leaking clear fluid small amounts, mostly with baby's movements. No odor, no blood. No abd pain, cramping, ctxs. Good FM. No fevers  O: Vitals:   12/11/20 0550 12/11/20 0551 12/11/20 0820 12/11/20 1125  BP: 117/63  113/65 114/65  Pulse: 72  73 92  Resp: 16  17 16   Temp: 98.2 F (36.8 C)  98.9 F (37.2 C) 97.8 F (36.6 C)  TempSrc: Oral  Oral Oral  SpO2: 100% 98%  100%  Weight:      Height:        Fetal Monitoring:  NST: cat II- reactive baseline 150 bpm mod var +10x10 accels. 2-3 deeper variable decels around 8AM with nadir in the 90's self resolved, then return at 9AM- resolved with position change and fluid bolus- cat I for 40 min after last variable Toco: acontractile  A/P: [name] 35 y.o. G1P0 at [redacted]w[redacted]d HD#13 admitted with PPROM on 6/21, now stable without evidence of infection or PTL  PPROM -S/p Latency Antibiotics -S/p BMZ 6/21-6/22 -S/p MFM consult and f/u US last week: 6/28 breech, funic presentation w/ MVP 2 cm. Weekly BPP for fluid check -Afebrile with WBC of 13.2 this morning. Cont to monitor temps and CBC q 72hr -GBS POS, would resume PCN w/ labor if vaginal delivery, but unlikely given breech Fetal Monitoring -Some variable decelerations on morning NST- s/p prolonged monitoring with reassuring fetal status. S/p 500 cc LR bolus. Encourage PO hydration. Cont fetal monitoring with NST q shift History Spinal Hemangioma -s/p Anesthesia consultation. [place] for spinal- MRI records requested, pending chart SCD VTE ppx Routine antepartum care. Continue inpatient admission with goal delivery after 34.0 weeks   Takeila Thayne A Hugh Garrow 12/11/20 12:50 PM

## 2020-12-12 DIAGNOSIS — O42112 Preterm premature rupture of membranes, onset of labor more than 24 hours following rupture, second trimester: Secondary | ICD-10-CM | POA: Diagnosis not present

## 2020-12-12 DIAGNOSIS — Z3A25 25 weeks gestation of pregnancy: Secondary | ICD-10-CM | POA: Diagnosis not present

## 2020-12-12 LAB — GLUCOSE, CAPILLARY: Glucose-Capillary: 76 mg/dL (ref 70–99)

## 2020-12-12 LAB — GLUCOSE TOLERANCE, 1 HOUR: Glucose, 1 Hour GTT: 154 mg/dL — ABNORMAL HIGH (ref 70–140)

## 2020-12-12 LAB — GLUCOSE, 2 HOUR: Glucose, 2 hour: 128 mg/dL (ref 70–139)

## 2020-12-12 NOTE — Progress Notes (Signed)
Notified Dr. Benjie Karvonen that pt received 100g glucola instead of the ordered 50g dose for 1hr tolerance test. Verbal order received to obtain 1 hr and 2 hr glucose draw.

## 2020-12-12 NOTE — Progress Notes (Signed)
[name] 35 y.o.  G1P0 at [redacted]w[redacted]d PPROM on 6/21 HD#14  S: Reports she took TDAP 4 wks back, has document, so cancel TDAP order. Continued leaking clear fluid small amounts, mostly with baby's movements. No odor, no blood. No abd pain, cramping, ctxs. Good FM. No fevers  O: Vitals:   12/11/20 2221 12/12/20 0308 12/12/20 0800 12/12/20 1148  BP: (!) 109/58 106/64 (!) 105/56 126/72  Pulse: 78 73 88 90  Resp: 18 18 18 18   Temp: 98 F (36.7 C) 97.8 F (36.6 C) 98.1 F (36.7 C) 97.9 F (36.6 C)  TempSrc: Oral Oral Oral Oral  SpO2: 99% 99% 100% 100%  Weight:      Height:        Fetal Monitoring:  NST: cat I- reactive baseline 150 bpm mod variability, 10x10 accels. Variable decels resolved  Toco: acontractile  A/P: [name] 35 y.o. G1P0 at 25w 4d  HD#14 admitted with PPROM on 6/21, stable without evidence of infection or PTL  PPROM -S/p Latency Antibiotics -S/p BMZ 6/21-6/22 -S/p MFM consult and f/u US last week: 6/28 breech, funic presentation w/ MVP 2 cm. Weekly BPP for fluid check -Afebrile, cont to monitor temps and CBC q 72hr -GBS POS, would resume PCN w/ labor if vaginal delivery, but unlikely given breech Fetal Monitoring -NST q shift, reactive, variables resolved  History Spinal Hemangioma -s/p Anesthesia consultation. [place] for spinal- MRI records pending SCD VTE ppx Routine antepartum care. Continue inpatient admission with goal delivery after 34.0 weeks Glucola was 3hr GTT - WNL  TDAP done at 22 wks due to new job   [name] 12/12/20 1:43 PM

## 2020-12-12 NOTE — Progress Notes (Signed)
Pt was given 100 gm of Glucose for 1 hr Glucola instead of 50. So advised to change this to 3hr GTT and check 3 labs- F, 1hr, 2hr and if all nl, then she doesn't need 3rd hour draw.  F- 76 1hr 154 2hr 128 3rd hr deferred since 3 wnl.

## 2020-12-13 ENCOUNTER — Inpatient Hospital Stay (HOSPITAL_BASED_OUTPATIENT_CLINIC_OR_DEPARTMENT_OTHER): Payer: 59

## 2020-12-13 DIAGNOSIS — O43192 Other malformation of placenta, second trimester: Secondary | ICD-10-CM

## 2020-12-13 DIAGNOSIS — O321XX0 Maternal care for breech presentation, not applicable or unspecified: Secondary | ICD-10-CM

## 2020-12-13 DIAGNOSIS — Z3A25 25 weeks gestation of pregnancy: Secondary | ICD-10-CM | POA: Diagnosis not present

## 2020-12-13 DIAGNOSIS — O42112 Preterm premature rupture of membranes, onset of labor more than 24 hours following rupture, second trimester: Secondary | ICD-10-CM | POA: Diagnosis not present

## 2020-12-13 DIAGNOSIS — O42912 Preterm premature rupture of membranes, unspecified as to length of time between rupture and onset of labor, second trimester: Secondary | ICD-10-CM

## 2020-12-13 NOTE — Progress Notes (Signed)
Mikayla Hicks 35 y.o. G1P0 at [redacted]w[redacted]d HD#15 admitted with PPROM on 6/21  S: Pt is doing well this morning no complaints. Denies abd pain, cramping, ctxs. LOF is still clear with small gushes mostly with FM in AM and evening- denies any odor or discoloration to fluid. Denies any fevers/chills. Good FM from baby boy '[place]'  Pt asking if possible to minimize blood draws as she is becoming a difficult stick  O: Vitals:   12/12/20 1148 12/12/20 1456 12/12/20 2025 12/13/20 0444  BP: 126/72 132/68 (!) 110/59 114/67  Pulse: 90 83 75 66  Resp: 18 18 18 18   Temp: 97.9 F (36.6 C) 98.2 F (36.8 C) 98.2 F (36.8 C) 98 F (36.7 C)  TempSrc: Oral Oral Oral Oral  SpO2: 100% 100% 99% 100%  Weight:      Height:       Physical; Exam: General: AAO, NAD Abdomen: soft, gravid uterus c/w GA, non-tender to palpation Ext: neg LE edema  Fetal Monitoring: NST: reactive baseline 135 bom mod var +10x10 accels, -decels Toco: acontractile  A/P: Mikayla Hicks 35 y.o. G1P0 at [redacted]w[redacted]d HD#15 admitted with PPROM on 6/21, now clinically stable without signs of infection or PTL  PPROM -S/p Latency Antibiotics -S/p BMZ 6/21-6/22 -S/p MFM consult and f/u US last week: 6/28 breech, funic presentation w/ MVP 2 cm. Weekly BPP for fluid check- ordered today -Afebrile with WBC of 13.2 7/3 Cont to monitor temps  -GBS POS, would resume PCN w/ labor if vaginal delivery, but unlikely given breech Fetal Monitoring - Cont fetal monitoring with NST q shift- reactive this morning History Spinal Hemangioma -s/p Anesthesia consultation. [place] for spinal- MRI records requested, pending chart SCD VTE ppx Antepartum care -s/p Tdap at 22w -s/p negative GDM screen 7/4 -will f/u TS [id number] policy  -continue inpatient mgmt with goal delivery at [place] 12/13/20 10:58 AM

## 2020-12-14 ENCOUNTER — Ambulatory Visit: Payer: 59

## 2020-12-14 ENCOUNTER — Ambulatory Visit: Payer: Self-pay

## 2020-12-14 DIAGNOSIS — Z3A26 26 weeks gestation of pregnancy: Secondary | ICD-10-CM | POA: Diagnosis not present

## 2020-12-14 DIAGNOSIS — O321XX0 Maternal care for breech presentation, not applicable or unspecified: Secondary | ICD-10-CM | POA: Diagnosis present

## 2020-12-14 DIAGNOSIS — O42112 Preterm premature rupture of membranes, onset of labor more than 24 hours following rupture, second trimester: Secondary | ICD-10-CM | POA: Diagnosis not present

## 2020-12-14 DIAGNOSIS — D649 Anemia, unspecified: Secondary | ICD-10-CM | POA: Diagnosis present

## 2020-12-14 DIAGNOSIS — D1809 Hemangioma of other sites: Secondary | ICD-10-CM | POA: Diagnosis present

## 2020-12-14 DIAGNOSIS — B951 Streptococcus, group B, as the cause of diseases classified elsewhere: Secondary | ICD-10-CM | POA: Diagnosis present

## 2020-12-14 HISTORY — DX: Maternal care for breech presentation, not applicable or unspecified: O32.1XX0

## 2020-12-14 LAB — CBC WITH DIFFERENTIAL/PLATELET
Abs Immature Granulocytes: 0.17 10*3/uL — ABNORMAL HIGH (ref 0.00–0.07)
Basophils Absolute: 0 10*3/uL (ref 0.0–0.1)
Basophils Relative: 0 %
Eosinophils Absolute: 0.1 10*3/uL (ref 0.0–0.5)
Eosinophils Relative: 1 %
HCT: 32.6 % — ABNORMAL LOW (ref 36.0–46.0)
Hemoglobin: 10.8 g/dL — ABNORMAL LOW (ref 12.0–15.0)
Immature Granulocytes: 2 %
Lymphocytes Relative: 21 %
Lymphs Abs: 2.3 10*3/uL (ref 0.7–4.0)
MCH: 28.2 pg (ref 26.0–34.0)
MCHC: 33.1 g/dL (ref 30.0–36.0)
MCV: 85.1 fL (ref 80.0–100.0)
Monocytes Absolute: 0.8 10*3/uL (ref 0.1–1.0)
Monocytes Relative: 7 %
Neutro Abs: 7.5 10*3/uL (ref 1.7–7.7)
Neutrophils Relative %: 69 %
Platelets: 239 10*3/uL (ref 150–400)
RBC: 3.83 MIL/uL — ABNORMAL LOW (ref 3.87–5.11)
RDW: 13.5 % (ref 11.5–15.5)
WBC: 10.9 10*3/uL — ABNORMAL HIGH (ref 4.0–10.5)
nRBC: 0 % (ref 0.0–0.2)

## 2020-12-14 LAB — TYPE AND SCREEN
ABO/RH(D): A POS
Antibody Screen: NEGATIVE

## 2020-12-14 NOTE — Progress Notes (Addendum)
Mikayla Hicks 35 y.o. G1P0 at [redacted]w[redacted]d  HD# 16  PPROM on 6/21  S: Patient doing well. No complaints. Reports small LOF -clear gushes mostly with baby's movements. . Denies any foul odor, discoloration, or blood. Denies any cramping or CTXs. No abd pain, N/V, fever/chills. Good FM.   O: Vitals:   12/13/20 1516 12/13/20 1920 12/14/20 0535 12/14/20 0830  BP: (!) 119/57 129/73 114/64 (!) 106/55  Pulse: 76 82 82 82  Resp: 17 16 16 16   Temp: 98 F (36.7 C) 98.7 F (37.1 C) 98.2 F (36.8 C) 98.1 F (36.7 C)  TempSrc: Oral Oral Oral Oral  SpO2: 100% 100% 100% 100%  Weight:      Height:      Tm 98.2   General: AAO, NAD Heart: RRR Lungs: CTA, non-labored breathing Abdomen: gravid uterus S+D,non-tender to palpation Ext: neg LE edema,Homan's neg, SCDs intact Neuro: nonfocal Skin: intact  Fetal Monitoring: NST: appropriate for 26 wks, baseline 150 bpm +10x10 accels, -decels Toco: rare contractions Category 1 tracing  Sono c/w AGA, (14th percentile), oligo, breech , funic presentation on 6/28 Breech on limited sono yesterday, results pending  Nl GTT CBC    Component Value Date/Time   WBC 10.9 (H) 12/14/2020 0530   RBC 3.83 (L) 12/14/2020 0530   HGB 10.8 (L) 12/14/2020 0530   HCT 32.6 (L) 12/14/2020 0530   PLT 239 12/14/2020 0530   MCV 85.1 12/14/2020 0530   MCH 28.2 12/14/2020 0530   MCHC 33.1 12/14/2020 0530   RDW 13.5 12/14/2020 0530   LYMPHSABS 2.3 12/14/2020 0530   MONOABS 0.8 12/14/2020 0530   EOSABS 0.1 12/14/2020 0530   BASOSABS 0.0 12/14/2020 0530     A/P: Mikayla Hicks 35 y.o. G1P0 at [redacted]w[redacted]d, with PPROM on 6/21, no s/s chorio  PPROM -Latency antibiotics completed , no clinical s/s of infection -S/p BMZ 6/21-6/22 -S/p MFM sono- reults noted   Fetal Monitoring -NST 30 min monitoring q shift. Reactive tracings, no evidence PTL   History Spinal Hemangioma -s/p Anesthesia consultation. [place] for spinal    4. Breech with funic presentation  5. GBS  positive  6. Anemia- continue fe  Remain inpt. Goal for delivery 34 weeks.   Mikayla Hicks 12/14/20 9:10 AM

## 2020-12-15 DIAGNOSIS — O42112 Preterm premature rupture of membranes, onset of labor more than 24 hours following rupture, second trimester: Secondary | ICD-10-CM | POA: Diagnosis not present

## 2020-12-15 DIAGNOSIS — Z3A26 26 weeks gestation of pregnancy: Secondary | ICD-10-CM | POA: Diagnosis not present

## 2020-12-15 MED ORDER — LACTATED RINGERS IV BOLUS
500.0000 mL | Freq: Once | INTRAVENOUS | Status: AC
  Administered 2020-12-15: 500 mL via INTRAVENOUS

## 2020-12-15 NOTE — Progress Notes (Signed)
Called to see patient for variable D-cell x2  Subjective: Patient notes active fetal movement, still with leaking fluid.  No change to odor or consistency or bloodiness of the fluid.  Patient denies fevers or abdominal pain.  Patient notes no contractions.  Patient notes no headache and feeling well.  Patient notes 5 days ago baby had two decelerations on the monitor but this has not happened again until today.  Objective: Vitals:   12/15/20 0544 12/15/20 0850 12/15/20 1158 12/15/20 1552  BP: (!) 99/57 (!) 115/56 113/66 (!) 102/56  Pulse: 74 83 71 80  Resp: 18 18 18 18   Temp: 98.1 F (36.7 C) 98.1 F (36.7 C) 98.4 F (36.9 C) 98.1 F (36.7 C)  TempSrc: Oral Oral Oral Oral  SpO2: 100% 99% 99% 98%  Weight:      Height:       General: Well-appearing, no distress, no anxiety Skin: Warm and dry Abdomen: Soft, nontender, no fundal tenderness GU deferred  Toco: No contractions FH: 140s, 10 x 10 accelerations, 10 beat variability, just prior to 5 PM patient to variable decelerations to the 120s with slow recovery over 2 minutes.  Good variability within the deceleration.  Patient had several additional sharp variables about 10 seconds each.  Since patient rolled to her left side and began IV fluid bolus no further decelerations over the past hour  Assessment plan: Hospital day 16 with preterm premature rupture of membranes now with anhydramnios and fetus in the breech position with funic presentation and cord presenting.  Given improvement to the fetal NST will defer sterile speculum exam at this time.  Baby has responded to IV fluids.  Encourage patient to drink plenty of fluids through the day and rest on her left side.  Okay to move back to routine fetal monitoring.  Patient is aware she should alert [place] with decreased fetal movement, changing consistency to fluid, fever, contractions, abdominal pain, vaginal bleeding.  [name] 12/15/2020 6:16 PM

## 2020-12-15 NOTE — Progress Notes (Signed)
HD 16, PPROM (6/21)  Denies ctx, still with trickles of fluid, no vb +FM  Patient Vitals for the past 24 hrs:  BP Temp Temp src Pulse Resp SpO2  12/15/20 0850 (!) 115/56 98.1 F (36.7 C) Oral 83 18 99 %  12/15/20 0544 (!) 99/57 98.1 F (36.7 C) Oral 74 18 100 %  12/15/20 0542 (!) 98/44 -- -- 73 -- --  12/15/20 0541 (!) 99/50 -- -- 70 -- 100 %  12/14/20 1913 135/77 97.9 F (36.6 C) Oral 93 18 100 %  12/14/20 1512 124/74 98.2 F (36.8 C) Oral 90 18 100 %  12/14/20 1130 (!) 129/50 97.6 F (36.4 C) Oral 94 17 100 %   A&ox3 Rrr Ctab Abd: soft, nt, gravid LE: no edema, nt bilat  FHT: 130s, nml variability, +accels (10x10), no decels Toco; no ctx  U/s 7/6: breech, funic presentation, anhydramnios noted; aga/14% 6/28  A/P: iup at 26.0 PPROM -Latency antibiotics completed , no clinical s/s of infection -S/p BMZ 6/21-6/22 -S/p MFM consult/ultrasound   Fetal Monitoring -NST 30 min monitoring q shift. Reactive, no evidence PTL   History Spinal Hemangioma -s/p Anesthesia consultation. Ok for spinal   4. Breech with funic presentation - c/s for delivery, pt aware   5. GBS positive   6. Anemia- iron daily   Remain inpt. Goal for delivery 34 weeks.

## 2020-12-16 DIAGNOSIS — O42112 Preterm premature rupture of membranes, onset of labor more than 24 hours following rupture, second trimester: Secondary | ICD-10-CM | POA: Diagnosis not present

## 2020-12-16 DIAGNOSIS — Z3A26 26 weeks gestation of pregnancy: Secondary | ICD-10-CM | POA: Diagnosis not present

## 2020-12-16 MED ORDER — SODIUM CHLORIDE 0.9% FLUSH
3.0000 mL | Freq: Two times a day (BID) | INTRAVENOUS | Status: DC
  Administered 2020-12-16 – 2021-02-03 (×87): 3 mL via INTRAVENOUS

## 2020-12-16 NOTE — Progress Notes (Signed)
HD 17,  PPROM (6/21)  Denies ctx, still with trickles of fluid, no vb, no odor to d/c, no fevers, no abdominal pain +FM No constipation or GERD Wheelchair privilege outside  Patient Vitals for the past 24 hrs:  BP Temp Temp src Pulse Resp SpO2  12/16/20 0837 (!) 92/51 -- -- 70 -- --  12/16/20 0748 (!) 91/42 98.3 F (36.8 C) Oral 75 18 99 %  12/16/20 0526 (!) 101/39 98.3 F (36.8 C) Oral 74 18 100 %  12/15/20 1930 128/67 97.7 F (36.5 C) Oral 79 16 100 %  12/15/20 1552 (!) 102/56 98.1 F (36.7 C) Oral 80 18 98 %  12/15/20 1158 113/66 98.4 F (36.9 C) Oral 71 18 99 %    A&ox3 Rrr Ctab Abd: soft, nt, gravid GU- no blood LE: no edema, nt bilat  FHT: awaiting am NST.  10p last night: 140s, + accels, 10 beat variability. Occ variable decel, 10-20 sed. Toco none   U/s 7/6: breech, funic presentation, anhydramnios noted; aga/14% 6/28  A/P: iup at 26.1 PPROM -Latency antibiotics completed , no clinical s/s of infection -S/p BMZ 6/21-6/22 -S/p MFM consult/ultrasound   Fetal Monitoring -NST 30 min monitoring q shift. Reactive, no evidence PTL. Episode of decels yesterday, improved with fluids and has not returned, good DM   History Spinal Hemangioma -s/p Anesthesia consultation. Ok for spinal   4. Breech with funic presentation - c/s for delivery, pt aware   5. GBS positive   6. Anemia- iron daily   Remain inpt. Goal for delivery 34 weeks.   [name] 12/16/2020 10:49 AM

## 2020-12-17 DIAGNOSIS — Z3A26 26 weeks gestation of pregnancy: Secondary | ICD-10-CM | POA: Diagnosis not present

## 2020-12-17 DIAGNOSIS — O42112 Preterm premature rupture of membranes, onset of labor more than 24 hours following rupture, second trimester: Secondary | ICD-10-CM | POA: Diagnosis not present

## 2020-12-17 LAB — TYPE AND SCREEN
ABO/RH(D): A POS
Antibody Screen: NEGATIVE

## 2020-12-17 LAB — CBC WITH DIFFERENTIAL/PLATELET
Abs Immature Granulocytes: 0.19 10*3/uL — ABNORMAL HIGH (ref 0.00–0.07)
Basophils Absolute: 0 10*3/uL (ref 0.0–0.1)
Basophils Relative: 0 %
Eosinophils Absolute: 0.1 10*3/uL (ref 0.0–0.5)
Eosinophils Relative: 1 %
HCT: 33.1 % — ABNORMAL LOW (ref 36.0–46.0)
Hemoglobin: 11 g/dL — ABNORMAL LOW (ref 12.0–15.0)
Immature Granulocytes: 2 %
Lymphocytes Relative: 18 %
Lymphs Abs: 1.9 10*3/uL (ref 0.7–4.0)
MCH: 28 pg (ref 26.0–34.0)
MCHC: 33.2 g/dL (ref 30.0–36.0)
MCV: 84.2 fL (ref 80.0–100.0)
Monocytes Absolute: 0.8 10*3/uL (ref 0.1–1.0)
Monocytes Relative: 7 %
Neutro Abs: 7.7 10*3/uL (ref 1.7–7.7)
Neutrophils Relative %: 72 %
Platelets: 231 10*3/uL (ref 150–400)
RBC: 3.93 MIL/uL (ref 3.87–5.11)
RDW: 13.5 % (ref 11.5–15.5)
WBC: 10.7 10*3/uL — ABNORMAL HIGH (ref 4.0–10.5)
nRBC: 0 % (ref 0.0–0.2)

## 2020-12-17 MED ORDER — PANTOPRAZOLE SODIUM 20 MG PO TBEC
20.0000 mg | DELAYED_RELEASE_TABLET | Freq: Every day | ORAL | Status: DC
  Administered 2020-12-21 – 2020-12-24 (×2): 20 mg via ORAL
  Filled 2020-12-17 (×14): qty 1

## 2020-12-17 MED ORDER — ONDANSETRON HCL 4 MG/2ML IJ SOLN
4.0000 mg | Freq: Once | INTRAMUSCULAR | Status: AC
  Administered 2020-12-17: 4 mg via INTRAVENOUS
  Filled 2020-12-17: qty 2

## 2020-12-17 NOTE — Progress Notes (Signed)
HD #18,    PPROM (6/21) 26.2 wks   Denies ctx, still with small trickles of fluid, no vb, no odor to d/c, no fevers, no abdominal pain +FM No constipation or GERD   Patient Vitals for the past 24 hrs:  BP Temp Temp src Pulse Resp SpO2  12/17/20 0822 131/71 97.9 F (36.6 C) Oral 86 18 100 %  12/16/20 2234 117/61 98.1 F (36.7 C) Oral 75 18 100 %  12/16/20 1930 120/61 98.3 F (36.8 C) Oral 70 20 100 %  12/16/20 1532 133/71 98 F (36.7 C) Oral 96 18 99 %  12/16/20 1147 122/73 97.7 F (36.5 C) Oral 88 18 99 %   CBC Latest Ref Rng & Units 12/17/2020 12/14/2020 12/11/2020  WBC 4.0 - 10.5 K/uL 10.7(H) 10.9(H) 13.2(H)  Hemoglobin 12.0 - 15.0 g/dL 11.0(L) 10.8(L) 11.4(L)  Hematocrit 36.0 - 46.0 % 33.1(L) 32.6(L) 34.8(L)  Platelets 150 - 400 K/uL 231 239 250     A&ox3 Rrr Ctab Abd: soft, nt, gravid GU- no blood LE: no edema, nt bilat  FHT: overnight NST reactive, 145-mod variability 15x15 accels no decels noted overnight   Last Sono- 7/6: breech, funic presentation, anhydramnios noted; Growth aga 14% 6/28  A/P: iup at 26.2 PPROM -Latency antibiotics completed , no clinical s/s of infection -S/p BMZ 6/21-6/22 -S/p MFM consult/ultrasound   Fetal Monitoring -NST 30 min monitoring q shift. Reactive, no evidence PTL. Episode of decels resolved   History Spinal Hemangioma -s/p Anesthesia consultation. Ok for spinal   4. Breech with funic presentation - c/s for delivery, pt aware   5. GBS positive   6. Anemia- iron daily   Remain inpt. Goal for delivery 34 weeks.   [name] 12/17/2020 10:53 AM

## 2020-12-18 DIAGNOSIS — O42112 Preterm premature rupture of membranes, onset of labor more than 24 hours following rupture, second trimester: Secondary | ICD-10-CM | POA: Diagnosis not present

## 2020-12-18 DIAGNOSIS — Z3A26 26 weeks gestation of pregnancy: Secondary | ICD-10-CM | POA: Diagnosis not present

## 2020-12-18 MED ORDER — LACTATED RINGERS IV BOLUS
1000.0000 mL | INTRAVENOUS | Status: DC | PRN
  Administered 2020-12-18 – 2021-02-03 (×3): 1000 mL via INTRAVENOUS

## 2020-12-18 NOTE — Progress Notes (Signed)
HD #19,    PPROM (6/21) 26.3 wks  Breech  S: Denies ctx, still with small trickles of fluid, no vb, no odor to d/c, no fevers, no abdominal pain. +FMs.  Has nausea, no s/s of chorioamnionitis, thinks related to hospital eggs. Better now. DIdn't start Pantoprazole    Patient Vitals for the past 24 hrs:  BP Temp Temp src Pulse Resp SpO2  12/18/20 0625 (!) 101/54 98.1 F (36.7 C) Oral 70 17 100 %  12/17/20 2245 (!) 119/58 97.9 F (36.6 C) Oral 69 16 98 %  12/17/20 2001 124/74 98 F (36.7 C) Oral 81 18 97 %  12/17/20 1218 116/61 98 F (36.7 C) Oral 75 18 99 %  12/17/20 0822 131/71 97.9 F (36.6 C) Oral 86 18 100 %   CBC Latest Ref Rng & Units 12/17/2020 12/14/2020 12/11/2020  WBC 4.0 - 10.5 K/uL 10.7(H) 10.9(H) 13.2(H)  Hemoglobin 12.0 - 15.0 g/dL 11.0(L) 10.8(L) 11.4(L)  Hematocrit 36.0 - 46.0 % 33.1(L) 32.6(L) 34.8(L)  Platelets 150 - 400 K/uL 231 239 250     A&ox3 Rrr Ctab Abd: soft, nt, gravid GU- no blood LE: no edema, nt bilat  FHT: overnight NST reactive, 145-mod variability 15x15 accels no decels noted overnight   Last Sono- 7/6: breech, funic presentation, anhydramnios noted Growth aga 14% 6/28  A/P: iup at 26.3 PPROM -Latency antibiotics completed , no clinical s/s of infection -S/p BMZ 6/21-6/22 -S/p MFM consult/ultrasound   Fetal Monitoring -NST 30 min monitoring q shift. Reactive, no evidence PTL. Episode of decels resolved   History Spinal Hemangioma -s/p Anesthesia consultation. Ok for spinal   4. Breech with funic presentation - c/s for delivery, pt aware   5. GBS positive   6. Anemia- iron daily   Remain inpt. Goal for delivery 34 weeks.   [name] 12/18/2020 7:01 AM

## 2020-12-19 ENCOUNTER — Inpatient Hospital Stay (HOSPITAL_BASED_OUTPATIENT_CLINIC_OR_DEPARTMENT_OTHER): Payer: 59

## 2020-12-19 DIAGNOSIS — Z3A26 26 weeks gestation of pregnancy: Secondary | ICD-10-CM | POA: Diagnosis not present

## 2020-12-19 DIAGNOSIS — O42112 Preterm premature rupture of membranes, onset of labor more than 24 hours following rupture, second trimester: Secondary | ICD-10-CM | POA: Diagnosis not present

## 2020-12-19 DIAGNOSIS — O42912 Preterm premature rupture of membranes, unspecified as to length of time between rupture and onset of labor, second trimester: Secondary | ICD-10-CM

## 2020-12-19 DIAGNOSIS — O321XX0 Maternal care for breech presentation, not applicable or unspecified: Secondary | ICD-10-CM

## 2020-12-19 DIAGNOSIS — O43192 Other malformation of placenta, second trimester: Secondary | ICD-10-CM

## 2020-12-19 DIAGNOSIS — O289 Unspecified abnormal findings on antenatal screening of mother: Secondary | ICD-10-CM

## 2020-12-19 MED ORDER — LACTATED RINGERS IV BOLUS: 1000.0000 mL | Freq: Once | INTRAVENOUS | Status: AC

## 2020-12-19 MED ORDER — HYDROCORTISONE ACETATE 25 MG RE SUPP
25.0000 mg | Freq: Two times a day (BID) | RECTAL | Status: AC | PRN
  Administered 2020-12-19 – 2020-12-26 (×10): 25 mg via RECTAL
  Filled 2020-12-19 (×17): qty 1

## 2020-12-19 MED ORDER — WITCH HAZEL-GLYCERIN EX PADS: MEDICATED_PAD | CUTANEOUS | Status: AC | PRN

## 2020-12-19 NOTE — Progress Notes (Signed)
HD #20 PPROM (6/21) 26'4wks  Breech  S: Denies ctx, still with small trickles of fluid though slightly increased overnight. No odor, no pain. No vb, no fevers, no abdominal pain. +FMs. Denies GERD or constipation.No contractions. Bothered by hemorrhoids.   Patient Vitals for the past 24 hrs:  BP Temp Temp src Pulse Resp SpO2  12/19/20 1141 120/67 98.4 F (36.9 C) Oral 79 18 100 %  12/19/20 0727 123/72 98.1 F (36.7 C) Oral 85 18 100 %  12/19/20 0447 112/62 -- -- 64 -- --  12/18/20 2313 116/63 97.8 F (36.6 C) Oral 71 16 98 %  12/18/20 1941 117/66 98.2 F (36.8 C) Oral 73 18 97 %  12/18/20 1546 (!) 142/70 98 F (36.7 C) Oral 94 18 100 %   CBC Latest Ref Rng & Units 12/17/2020 12/14/2020 12/11/2020  WBC 4.0 - 10.5 K/uL 10.7(H) 10.9(H) 13.2(H)  Hemoglobin 12.0 - 15.0 g/dL 11.0(L) 10.8(L) 11.4(L)  Hematocrit 36.0 - 46.0 % 33.1(L) 32.6(L) 34.8(L)  Platelets 150 - 400 K/uL 231 239 250     A&ox3 Abd: soft, nt, gravid GU- no blood LE: no edema, nt bilat  FHT: continuous monitorin overnight. NST reactive, 145-mod variability 15x15 accels, about 1-2/ hr variable decel, sometimes quick and deeper (to 80's for 10 seconds) and sometimes more prolonged with smaller change from baseline- to 115-120s for up to 90 seconds, great variability even within these prolonged decels.  Sono 6/28: growth 14% Sono- 7/6: breech, funic presentation, anhydramnios noted Sono 7/11: 878 grams, 27%, HC and BPD all at 1%, breech, marginal CI, anhydramnios with largest pocket 1.2 cm.   A/P: iup at 26.4 PPROM -Latency antibiotics completed , no clinical s/s of infection -S/p BMZ 6/21-6/22 -S/p MFM consult/ultrasound   Fetal Monitoring -NST 30 min monitoring q shift. Reactive, no evidence PTL. Episode of decels resumed overnight but overall good FM, reactive NST and stable u/s with anhydramnios but appropriate fetal growth.   History Spinal Hemangioma -s/p Anesthesia consultation. Ok for spinal   4. Breech with  funic presentation - c/s for delivery, pt aware   5. GBS positive   6. Anemia- iron daily   Remain inpt. Goal for delivery 34 weeks.   [name] 12/19/2020 2:21 PM

## 2020-12-20 DIAGNOSIS — Z3A26 26 weeks gestation of pregnancy: Secondary | ICD-10-CM | POA: Diagnosis not present

## 2020-12-20 DIAGNOSIS — O42112 Preterm premature rupture of membranes, onset of labor more than 24 hours following rupture, second trimester: Secondary | ICD-10-CM | POA: Diagnosis not present

## 2020-12-20 LAB — CBC WITH DIFFERENTIAL/PLATELET
Abs Immature Granulocytes: 0.18 10*3/uL — ABNORMAL HIGH (ref 0.00–0.07)
Basophils Absolute: 0 10*3/uL (ref 0.0–0.1)
Basophils Relative: 0 %
Eosinophils Absolute: 0.1 10*3/uL (ref 0.0–0.5)
Eosinophils Relative: 1 %
HCT: 32.6 % — ABNORMAL LOW (ref 36.0–46.0)
Hemoglobin: 11 g/dL — ABNORMAL LOW (ref 12.0–15.0)
Immature Granulocytes: 2 %
Lymphocytes Relative: 21 %
Lymphs Abs: 2.2 10*3/uL (ref 0.7–4.0)
MCH: 28.4 pg (ref 26.0–34.0)
MCHC: 33.7 g/dL (ref 30.0–36.0)
MCV: 84.2 fL (ref 80.0–100.0)
Monocytes Absolute: 0.7 10*3/uL (ref 0.1–1.0)
Monocytes Relative: 6 %
Neutro Abs: 7.3 10*3/uL (ref 1.7–7.7)
Neutrophils Relative %: 70 %
Platelets: 235 10*3/uL (ref 150–400)
RBC: 3.87 MIL/uL (ref 3.87–5.11)
RDW: 13.7 % (ref 11.5–15.5)
WBC: 10.5 10*3/uL (ref 4.0–10.5)
nRBC: 0 % (ref 0.0–0.2)

## 2020-12-20 LAB — TYPE AND SCREEN
ABO/RH(D): A POS
Antibody Screen: NEGATIVE

## 2020-12-20 NOTE — Progress Notes (Signed)
Mikayla Hicks 35 y.o. G1P0 at [redacted]w[redacted]d  HD# 21  PPROM on 6/21  S: Patient doing well. No complaints. Reports small LOF -clear gushes mostly with baby's movements. . Denies any foul odor, discoloration, or blood. Denies any cramping or CTXs. No abd pain, N/V, fever/chills. Good FM.   O: Vitals:   12/19/20 1540 12/19/20 1918 12/19/20 2345 12/20/20 0821  BP: (!) 112/58 118/64 (!) 116/55 139/71  Pulse: 82 90 88 86  Resp: 18 18 16 18   Temp: 98.1 F (36.7 C) 98 F (36.7 C) 98.1 F (36.7 C) 98.1 F (36.7 C)  TempSrc: Oral Oral Oral Oral  SpO2: 100% 100% 100% 99%  Weight:      Height:      Tm 98.1   General: AAO, NAD Heart: RRR Lungs: CTA, non-labored breathing Abdomen: gravid uterus S+D,non-tender to palpation Ext: neg LE edema,Homan's neg, SCDs intact Neuro: nonfocal Skin: intact  Fetal Monitoring: NST: appropriate for 26 wks, baseline 140-150 bpm +10x10 accels,  occ quick variable decels Toco: rare contractions Category 1 tracing  Sono c/w AGA, (14th percentile), oligo, breech , funic presentation on 6/28 Sono- 7/6: breech, funic presentation, anhydramnios noted Sono 7/11: 878 grams, 27%, HC and BPD all at 1%, breech, marginal CI, anhydramnios with largest pocket 1.2 cm  Nl GTT CBC    Component Value Date/Time   WBC 10.5 12/20/2020 0533   RBC 3.87 12/20/2020 0533   HGB 11.0 (L) 12/20/2020 0533   HCT 32.6 (L) 12/20/2020 0533   PLT 235 12/20/2020 0533   MCV 84.2 12/20/2020 0533   MCH 28.4 12/20/2020 0533   MCHC 33.7 12/20/2020 0533   RDW 13.7 12/20/2020 0533   LYMPHSABS 2.2 12/20/2020 0533   MONOABS 0.7 12/20/2020 0533   EOSABS 0.1 12/20/2020 0533   BASOSABS 0.0 12/20/2020 0533     A/P: [name] 35 y.o. G1P0 at [redacted]w[redacted]d, with PPROM on 6/21, no s/s chorio  PPROM -Latency antibiotics completed , no clinical s/s of infection -S/p BMZ 6/21-6/22 -S/p MFM sono- reults noted   Fetal Monitoring -NST 30 min monitoring q shift. Reactive tracings, no evidence  PTL   History Spinal Hemangioma -s/p Anesthesia consultation. Ok for spinal    4. Breech with funic presentation  5. GBS positive  6. Anemia- continue fe  Remain inpt. Goal for delivery 34 weeks.   [name] 12/20/20 9:34 AM

## 2020-12-21 DIAGNOSIS — Z3A26 26 weeks gestation of pregnancy: Secondary | ICD-10-CM | POA: Diagnosis not present

## 2020-12-21 DIAGNOSIS — O42112 Preterm premature rupture of membranes, onset of labor more than 24 hours following rupture, second trimester: Secondary | ICD-10-CM | POA: Diagnosis not present

## 2020-12-21 NOTE — Progress Notes (Signed)
Mikayla Hicks 35 y.o. G1P0 at [redacted]w[redacted]d HD#22 admitted with PPROM from 6/21  **Late Entry Note. Patient seen and evaluated at the bedside ~1200 today 7/13  S: Pt doing well today. Did have a brief episode of sharp epigastric pain that lasted a few seconds and spontaneously resolved. Denies any acid reflux symptoms. Denies N/V. Reports regular BM's. No lower abdominal pain or CTXs. LOF the same, clear, no odor. No VB. No fevers/chills  O: Vitals:   12/20/20 2203 12/21/20 0829 12/21/20 1133 12/21/20 1608  BP:  120/75 135/83 (!) 158/73  Pulse: 74 74 71 96  Resp:  19 19 18   Temp: 98.2 F (36.8 C) 98 F (36.7 C) 97.9 F (36.6 C) 98 F (36.7 C)  TempSrc: Oral Oral Oral Oral  SpO2: 100% 100% 100% 100%  Weight:      Height:        Physical Exam: General: AAO, NAD Heart: RRR Lungs: non-labored breathing Abdomen: gravid uterus c/w GA, non-tender to palpation Ext: neg LE edema  Fetal Monitoring: NST: reactive this morning- baseline 140 bpm mod var +accels, 2 small variable decels noted at start of tracing, resolved for 25 min Toco: acontractile  [place] 7/11 breech presentation with MCI, MVP 1.2cm   A/P: STANLEY HELMUTH 35 y.o. G1P0 at [redacted]w[redacted]d HD#22 admitted with PPROM on 6/21, now clinically stable without evidence of PTL or infection  PPROM -S/p Latency Antibiotics -S/p BMZ 6/21-6/22 -S/p MFM and NICU consults -Weekly BPP with fluid check, 7/11 breech  -Afebrile with WBC of 10.5 on 7/12, Cont to monitor temps -GBS POS, would resume PCN w/ labor if vaginal delivery, but unlikely given breech Fetal Monitoring - Cont fetal monitoring with NST q shift- reactive this morning History Spinal Hemangiom -S/p Anesthesia consultation, ok for spinal SCD VTE ppx Antepartum care -s/p Tdap at 22w -s/p negative GDM screen 7/4 -rec Tums PRN epigastric pain, notify if persistent  -continue inpatient mgmt with goal delivery at [place] 12/21/20 7:03 PM

## 2020-12-21 NOTE — Progress Notes (Signed)
Called by nursing to review strip FHT: 120s, nml varibility, variable to 70 then return to baseline with nml variability; decel about 1 min to 90s then nml variability after; baseline 150s then changed back to 120s, +accels 10x10 noted shortly after decel Toco: no ctx  Will continue monitoring for an hour, longer if continued variables or decels and nurse to inform if persists; would also avoid patient lying flat on back in general and with monitoring

## 2020-12-22 DIAGNOSIS — O42112 Preterm premature rupture of membranes, onset of labor more than 24 hours following rupture, second trimester: Secondary | ICD-10-CM | POA: Diagnosis not present

## 2020-12-22 DIAGNOSIS — Z3A27 27 weeks gestation of pregnancy: Secondary | ICD-10-CM | POA: Diagnosis not present

## 2020-12-22 NOTE — Progress Notes (Signed)
RN claled to assess NRT>  NST reviewed- 150s mod variability, + one variable decel noted but now back to cat I  No other concerns, okay to stop MNST  Azucena Fallen MD

## 2020-12-22 NOTE — Progress Notes (Addendum)
HD#23, iup at 27.0wga PPROM 6/21  No new c/o; leaking fluid on occasion, no vb/ctx; +FM   Patient Vitals for the past 24 hrs:  BP Temp Temp src Pulse Resp SpO2  12/22/20 0900 126/61 98.9 F (37.2 C) Oral 82 16 98 %  12/22/20 0449 (!) 103/52 98 F (36.7 C) Oral 76 16 100 %  12/21/20 2357 121/61 98.1 F (36.7 C) Oral 62 18 100 %  12/21/20 1939 112/71 98.1 F (36.7 C) Oral 86 16 100 %  12/21/20 1608 (!) 158/73 98 F (36.7 C) Oral 96 18 100 %  12/21/20 1133 135/83 97.9 F (36.6 C) Oral 71 19 100 %   A&ox3 Nml respirations Abd: soft,nt, gravid LE: no edema, nt bilat  Sono 6/28: growth 14% Sono- 7/6: breech, funic presentation, anhydramnios noted Sono 7/11: 878 grams, 27%, HC and BPD all at 1%, breech, marginal CI, anhydramnios with largest pocket 1.2 cm  NST: reviewed for day, 120s, nml variability, only couple variables and the one decel last  night; otherwise no other variables/decels and accels continue to be noted, 10x10 and 15x15  Toco: no ctx  A/P: 35 y/o G1 at 27wga PPROM-stable; no s/s chorio or labor; monitor closely for change in fetal status or maternal status -S/p Latency Antibiotics -S/p BMZ 6/21-6/22 -S/p MFM and NICU consults -Weekly BPP with fluid check, 7/11 breech  -Afebrile with WBC of 10.5 on 7/12, Cont to monitor temps -GBS POS -c/s planned d/t breech -Fetal Monitoring: Cont fetal monitoring with NST q shift; fetal status reassuring -History Spinal Hemangioma -S/p Anesthesia consultation, ok for spinal SCD VTE ppx Antepartum care -s/p Tdap at 22w -s/p negative GDM screen 7/4 -continue inpatient mgmt with goal delivery at 34w

## 2020-12-23 DIAGNOSIS — Z3A27 27 weeks gestation of pregnancy: Secondary | ICD-10-CM | POA: Diagnosis not present

## 2020-12-23 DIAGNOSIS — O42112 Preterm premature rupture of membranes, onset of labor more than 24 hours following rupture, second trimester: Secondary | ICD-10-CM | POA: Diagnosis not present

## 2020-12-23 LAB — CBC WITH DIFFERENTIAL/PLATELET
Abs Immature Granulocytes: 0.17 10*3/uL — ABNORMAL HIGH (ref 0.00–0.07)
Basophils Absolute: 0 10*3/uL (ref 0.0–0.1)
Basophils Relative: 0 %
Eosinophils Absolute: 0.1 10*3/uL (ref 0.0–0.5)
Eosinophils Relative: 1 %
HCT: 33.6 % — ABNORMAL LOW (ref 36.0–46.0)
Hemoglobin: 11.2 g/dL — ABNORMAL LOW (ref 12.0–15.0)
Immature Granulocytes: 2 %
Lymphocytes Relative: 21 %
Lymphs Abs: 2.2 10*3/uL (ref 0.7–4.0)
MCH: 28.3 pg (ref 26.0–34.0)
MCHC: 33.3 g/dL (ref 30.0–36.0)
MCV: 84.8 fL (ref 80.0–100.0)
Monocytes Absolute: 0.7 10*3/uL (ref 0.1–1.0)
Monocytes Relative: 7 %
Neutro Abs: 7.2 10*3/uL (ref 1.7–7.7)
Neutrophils Relative %: 69 %
Platelets: 239 10*3/uL (ref 150–400)
RBC: 3.96 MIL/uL (ref 3.87–5.11)
RDW: 13.8 % (ref 11.5–15.5)
WBC: 10.5 10*3/uL (ref 4.0–10.5)
nRBC: 0 % (ref 0.0–0.2)

## 2020-12-23 LAB — TYPE AND SCREEN
ABO/RH(D): A POS
Antibody Screen: NEGATIVE

## 2020-12-23 NOTE — Progress Notes (Signed)
HD#24, 27.1 wks PPROM 6/21  No new c/o; leaking fluid on occasion, no vb/ctx; +FM   Patient Vitals for the past 24 hrs:  BP Temp Temp src Pulse Resp SpO2  12/23/20 0522 (!) 94/54 98.1 F (36.7 C) Oral 80 16 98 %  12/22/20 2346 (!) 110/59 98 F (36.7 C) Oral 75 14 98 %  12/22/20 1946 124/75 98.1 F (36.7 C) Oral 86 16 100 %  12/22/20 1547 118/72 98.4 F (36.9 C) Oral 79 16 100 %  12/22/20 0900 126/61 98.9 F (37.2 C) Oral 82 16 98 %    A&ox3 Nml respirations Abd: soft,nt, gravid, soft LE: no edema, nt bilat  Sono 6/28: growth 14% Sono- 7/6: breech, funic presentation, anhydramnios noted Sono 7/11: 878 grams, 27%, HC and BPD all at 1%, breech, marginal CI, anhydramnios with largest pocket 1.2 cm  NST: reviewed since last evening and overnight. This AM pending. Baseline 130s, moderate variability, only couple variable decels 1-2 minutes. Otherwise no other variables/decels and accels present 10x10 and 15x15  Toco: no ctx  A/P: 35 y/o G1 at 27.1 wga PPROM-stable; no s/s chorio or labor; monitor closely for change in fetal status or maternal status -S/p Latency Antibiotics -S/p BMZ 6/21-6/22 -S/p MFM and NICU consults -Weekly BPP with fluid check, 7/11 breech  -Afebrile with stable WBCs -GBS POS -c/s planned for breech -Fetal Monitoring: Cont fetal monitoring with NST q shift; fetal status reassuring -History Spinal Hemangioma -S/p Anesthesia consultation, ok for spinal SCD VTE ppx Antepartum care -s/p Tdap at 22w -s/p negative GDM screen 7/4 -continue inpatient mgmt with goal delivery at [place] MD

## 2020-12-24 DIAGNOSIS — O42112 Preterm premature rupture of membranes, onset of labor more than 24 hours following rupture, second trimester: Secondary | ICD-10-CM | POA: Diagnosis not present

## 2020-12-24 DIAGNOSIS — Z3A27 27 weeks gestation of pregnancy: Secondary | ICD-10-CM | POA: Diagnosis not present

## 2020-12-24 NOTE — Progress Notes (Signed)
HD #25 27.2 PPROM 6/21  No f/c/s, +fm, no ctx/vb; leaks small amounts fluid, no change   Patient Vitals for the past 24 hrs:  BP Temp Temp src Pulse Resp SpO2  12/24/20 0754 129/69 98.2 F (36.8 C) Oral 79 15 99 %  12/23/20 2221 117/71 97.7 F (36.5 C) Oral 73 17 100 %  12/23/20 1932 (!) 120/58 98.3 F (36.8 C) Oral 69 16 100 %  12/23/20 1638 118/79 98.4 F (36.9 C) Oral 84 16 100 %  12/23/20 1220 127/73 98.5 F (36.9 C) Oral 86 16 100 %   A&ox3 Nml respirations Abd: soft,nt, gravid, soft LE: no edema, nt bilat   Sono 6/28: growth 14% Sono- 7/6: breech, funic presentation, anhydramnios noted Sono 7/11: 878 grams, 27%, HC and BPD all at 1%, breech, marginal CI, anhydramnios with largest pocket 1.2 cm   FHT: 120s, 130s baseline, +accels, no decels TOCO: no ctx  A/P: 35 y/o G1 at 27.2 wga PPROM-stable; no s/s chorio or labor; monitor closely for change in fetal status or maternal status -S/p Latency Antibiotics -S/p BMZ 6/21-6/22 -S/p MFM and NICU consults -Weekly BPP with fluid check, 7/11 breech  -Afebrile with stable WBCs -GBS POS -c/s planned for breech -Fetal Monitoring: Cont fetal monitoring with NST q shift; fetal status reassuring -History Spinal Hemangioma -S/p Anesthesia consultation, ok for spinal SCD VTE ppx Antepartum care -s/p Tdap at 22w -s/p negative GDM screen 7/4 -continue inpatient mgmt with goal delivery at 34w

## 2020-12-25 DIAGNOSIS — O42112 Preterm premature rupture of membranes, onset of labor more than 24 hours following rupture, second trimester: Secondary | ICD-10-CM | POA: Diagnosis not present

## 2020-12-25 DIAGNOSIS — Z3A27 27 weeks gestation of pregnancy: Secondary | ICD-10-CM | POA: Diagnosis not present

## 2020-12-25 NOTE — Progress Notes (Addendum)
HD #27 27.4 wga, pprom 6/21 Breech and funic presentation   No f/c/s, leaking small amt of fluid on occasion, no vb/ctx +FM  Patient Vitals for the past 24 hrs:  BP Temp Temp src Pulse Resp SpO2  12/25/20 1203 112/67 98.3 F (36.8 C) Oral 89 16 95 %  12/25/20 0943 (!) 112/59 98.3 F (36.8 C) Oral 84 16 98 %  12/25/20 0633 (!) 118/59 98.1 F (36.7 C) Oral 64 16 98 %  12/24/20 2206 -- -- -- -- -- 99 %  12/24/20 2200 -- -- -- -- -- 98 %  12/24/20 1955 128/79 98.2 F (36.8 C) Oral 94 16 100 %  12/24/20 1529 124/70 98 F (36.7 C) Oral 81 16 100 %    A&ox3 Nml respirations Abd: soft,nt, gravid, soft LE: no edema, nt bilat   Sono 6/28: growth 14% Sono- 7/6: breech, funic presentation, anhydramnios noted Sono 7/11: 878 grams, 27%, HC and BPD all at 1%, breech, marginal CI, anhydramnios with largest pocket 1.2 cm     FHT: 120s, 140s baseline, +accels, no decels, rare quick variable TOCO: no ctx   A/P: 35 y/o G1 at 92.4 wga PPROM-stable; no s/s chorio or labor; monitor closely for change in fetal status or maternal status -S/p Latency Antibiotics -S/p BMZ 6/21-6/22 -S/p MFM and NICU consults -Weekly BPP with fluid check, 7/11 breech  -Afebrile with stable WBCs -GBS POS -c/s planned for breech -Fetal Monitoring: Cont fetal monitoring with NST q shift; fetal status reassuring -History Spinal Hemangioma -S/p Anesthesia consultation, ok for spinal SCD VTE ppx Antepartum care -s/p Tdap at 22w -s/p negative GDM screen 7/4 -continue inpatient mgmt with goal delivery at 34w

## 2020-12-26 DIAGNOSIS — O42112 Preterm premature rupture of membranes, onset of labor more than 24 hours following rupture, second trimester: Secondary | ICD-10-CM | POA: Diagnosis not present

## 2020-12-26 DIAGNOSIS — Z3A27 27 weeks gestation of pregnancy: Secondary | ICD-10-CM | POA: Diagnosis not present

## 2020-12-26 LAB — CBC WITH DIFFERENTIAL/PLATELET
Abs Immature Granulocytes: 0.16 10*3/uL — ABNORMAL HIGH (ref 0.00–0.07)
Basophils Absolute: 0 10*3/uL (ref 0.0–0.1)
Basophils Relative: 0 %
Eosinophils Absolute: 0.1 10*3/uL (ref 0.0–0.5)
Eosinophils Relative: 1 %
HCT: 33.4 % — ABNORMAL LOW (ref 36.0–46.0)
Hemoglobin: 11.2 g/dL — ABNORMAL LOW (ref 12.0–15.0)
Immature Granulocytes: 2 %
Lymphocytes Relative: 22 %
Lymphs Abs: 2.1 10*3/uL (ref 0.7–4.0)
MCH: 28.3 pg (ref 26.0–34.0)
MCHC: 33.5 g/dL (ref 30.0–36.0)
MCV: 84.3 fL (ref 80.0–100.0)
Monocytes Absolute: 0.7 10*3/uL (ref 0.1–1.0)
Monocytes Relative: 8 %
Neutro Abs: 6.4 10*3/uL (ref 1.7–7.7)
Neutrophils Relative %: 67 %
Platelets: 236 10*3/uL (ref 150–400)
RBC: 3.96 MIL/uL (ref 3.87–5.11)
RDW: 13.8 % (ref 11.5–15.5)
WBC: 9.4 10*3/uL (ref 4.0–10.5)
nRBC: 0 % (ref 0.0–0.2)

## 2020-12-26 LAB — TYPE AND SCREEN
ABO/RH(D): A POS
Antibody Screen: NEGATIVE

## 2020-12-27 ENCOUNTER — Inpatient Hospital Stay (HOSPITAL_BASED_OUTPATIENT_CLINIC_OR_DEPARTMENT_OTHER): Payer: 59

## 2020-12-27 DIAGNOSIS — O42912 Preterm premature rupture of membranes, unspecified as to length of time between rupture and onset of labor, second trimester: Secondary | ICD-10-CM | POA: Diagnosis not present

## 2020-12-27 DIAGNOSIS — O321XX0 Maternal care for breech presentation, not applicable or unspecified: Secondary | ICD-10-CM | POA: Diagnosis not present

## 2020-12-27 DIAGNOSIS — Z3A27 27 weeks gestation of pregnancy: Secondary | ICD-10-CM

## 2020-12-27 DIAGNOSIS — O43192 Other malformation of placenta, second trimester: Secondary | ICD-10-CM | POA: Diagnosis not present

## 2020-12-27 DIAGNOSIS — O42112 Preterm premature rupture of membranes, onset of labor more than 24 hours following rupture, second trimester: Secondary | ICD-10-CM | POA: Diagnosis not present

## 2020-12-27 NOTE — Progress Notes (Signed)
HD #28 27'5 wga, pprom 6/21 Breech and funic presentation   No f/c/s, leaking small amt of fluid- no blood, no odor. no vb/ctx +FM Pt denies constipation or GERD. Getting out of room daily  Patient Vitals for the past 24 hrs:  BP Temp Temp src Pulse Resp SpO2  12/27/20 1111 (!) 98/59 98.3 F (36.8 C) Oral 81 18 100 %  12/27/20 0815 114/69 98 F (36.7 C) Oral 74 18 99 %  12/26/20 2240 (!) 111/55 98 F (36.7 C) Oral 67 20 100 %  12/26/20 1919 123/74 98.4 F (36.9 C) Oral 82 18 99 %  12/26/20 1546 116/61 98.4 F (36.9 C) Oral 76 18 100 %     A&ox3 Nml respirations Abd: soft,nt, gravid, soft LE: no edema, nt bilat   Sono 6/28: growth 14% Sono- 7/6: breech, funic presentation, anhydramnios noted Sono 7/11: 878 grams, 27%, HC and BPD all at 1%, breech, marginal CI, anhydramnios with largest pocket 1.2 cm     FHT: 130s baseline, +accels, no decels, rare quick variable TOCO: no ctx   A/P: 35 y/o G1 at 28'5 PPROM-stable; no s/s chorio or labor; monitor closely for change in fetal status or maternal status -S/p Latency Antibiotics -S/p BMZ 6/21-6/22 -S/p MFM and NICU consults -Weekly BPP with fluid check, 7/11 breech, repeat due now, will order now -Afebrile with stable WBCs -GBS POS -c/s planned for breech -Fetal Monitoring: Cont fetal monitoring with NST q shift; fetal status reassuring -History Spinal Hemangioma -S/p Anesthesia consultation, ok for spinal SCD VTE ppx Antepartum care -s/p Tdap at 22w -s/p negative GDM screen 7/4 -continue inpatient mgmt with goal delivery at [place] 12/27/2020 11:41 AM

## 2020-12-27 NOTE — Plan of Care (Signed)
  Problem: Education: Goal: Knowledge of disease or condition will improve Outcome: Completed/Met Goal: Knowledge of the prescribed therapeutic regimen will improve Outcome: Completed/Met

## 2020-12-28 DIAGNOSIS — Z3A27 27 weeks gestation of pregnancy: Secondary | ICD-10-CM | POA: Diagnosis not present

## 2020-12-28 DIAGNOSIS — O42112 Preterm premature rupture of membranes, onset of labor more than 24 hours following rupture, second trimester: Secondary | ICD-10-CM | POA: Diagnosis not present

## 2020-12-28 NOTE — Plan of Care (Signed)
  Problem: Clinical Measurements: Goal: Complications related to the disease process, condition or treatment will be avoided or minimized Outcome: Progressing   

## 2020-12-28 NOTE — Progress Notes (Signed)
Mikayla Hicks 35 y.o. G1P0 at [redacted]w[redacted]d HD#29 admitted with PPROM on 6/21  S: Mikayla Hicks doing well this morning. LOF the same, clear no odor, no bleeding. Denies any abdominal pain or cramping. No N/V, F/C. Reports normal BM without constipation. Good FM throughout the day  O: Vitals:   12/27/20 1600 12/27/20 1935 12/27/20 2228 12/28/20 0800  BP: 113/67 (!) 115/53 119/63 121/72  Pulse: 78 74 69 85  Resp: 18 16 20 19   Temp: 98 F (36.7 C) 98.2 F (36.8 C) (!) 97.2 F (36.2 C) 98 F (36.7 C)  TempSrc: Oral Oral Oral Oral  SpO2: 100% 99% 98% 99%  Weight:      Height:       Physical Exam: General: AAO, NAD Heart: regular rate Lungs: non-labored respiratory effort Abdomen: gravid uterus, soft non-tender Ext: neg LE edema  Fetal Monitoring:  NST: reactive baseline 130 bpm mod var +accels15x15, no decels Toco: acontractile  7/19 MFM [place] breech, MVP 1.1cm  A/P: Mikayla Hicks 35 y.o. G1P0 at [redacted]w[redacted]d HD#29 admitted with PPROM on 6/21, now clinically stable without signs of infection or PTL  PPROM -S/p Latency Antibiotics -S/p BMZ 6/21-6/22 -S/p MFM and NICU consults -Afebrile with WBC of 10.5 on 7/12, Cont to monitor temps -GBS POS, would resume PCN w/ labor if vaginal delivery, but unlikely given breech Fetal Monitoring - Con fetal monitoring with NST q shift- reactive this morning -Weekly [place] fluid and position check, BPP q week at 30w History Spinal Hemangioma -S/p Anesthesia consultation, ok for spinal SCD VTE ppx Antepartum care -s/p Tdap at 22w -s/p negative GDM screen 7/4 -continue inpatient mgmt with goal delivery at [place] 12/28/20 11:11 AM

## 2020-12-29 DIAGNOSIS — O42112 Preterm premature rupture of membranes, onset of labor more than 24 hours following rupture, second trimester: Secondary | ICD-10-CM | POA: Diagnosis not present

## 2020-12-29 DIAGNOSIS — Z3A27 27 weeks gestation of pregnancy: Secondary | ICD-10-CM | POA: Diagnosis not present

## 2020-12-29 LAB — CBC WITH DIFFERENTIAL/PLATELET
Abs Immature Granulocytes: 0.17 10*3/uL — ABNORMAL HIGH (ref 0.00–0.07)
Basophils Absolute: 0 10*3/uL (ref 0.0–0.1)
Basophils Relative: 0 %
Eosinophils Absolute: 0.1 10*3/uL (ref 0.0–0.5)
Eosinophils Relative: 1 %
HCT: 34.8 % — ABNORMAL LOW (ref 36.0–46.0)
Hemoglobin: 11.2 g/dL — ABNORMAL LOW (ref 12.0–15.0)
Immature Granulocytes: 2 %
Lymphocytes Relative: 21 %
Lymphs Abs: 2 10*3/uL (ref 0.7–4.0)
MCH: 27.6 pg (ref 26.0–34.0)
MCHC: 32.2 g/dL (ref 30.0–36.0)
MCV: 85.7 fL (ref 80.0–100.0)
Monocytes Absolute: 0.7 10*3/uL (ref 0.1–1.0)
Monocytes Relative: 7 %
Neutro Abs: 6.8 10*3/uL (ref 1.7–7.7)
Neutrophils Relative %: 69 %
Platelets: 240 10*3/uL (ref 150–400)
RBC: 4.06 MIL/uL (ref 3.87–5.11)
RDW: 14 % (ref 11.5–15.5)
WBC: 9.8 10*3/uL (ref 4.0–10.5)
nRBC: 0 % (ref 0.0–0.2)

## 2020-12-29 LAB — TYPE AND SCREEN
ABO/RH(D): A POS
Antibody Screen: NEGATIVE

## 2020-12-29 NOTE — Progress Notes (Signed)
Mikayla Hicks 35 y.o. G1P0 at [redacted]w[redacted]d HD#30 admitted with PPROM on 6/21  S: Mikayla Hicks doing well this morning. LOF the same, clear no odor, no bleeding. Denies any abdominal pain or cramping. No N/V, F/C. Reports normal BM without constipation. Good FM throughout the day  Pt frustrated and tearful today about prolonged stay, inability to exercise. "Hit the wall" still understands importance of staying  O: Vitals:   12/28/20 1927 12/28/20 2310 12/29/20 0444 12/29/20 1135  BP: 112/60 114/63 120/70 126/77  Pulse: 69 76 77 93  Resp: 18 16 15 16   Temp: 98.6 F (37 C) 97.8 F (36.6 C) 98.1 F (36.7 C) 98.1 F (36.7 C)  TempSrc: Oral Oral Oral Oral  SpO2: 98%  99% 99%  Weight:      Height:       Physical Exam: General: AAO, NAD  Abdomen: gravid uterus, soft non-tender Ext: neg LE edema  Fetal Monitoring:  NST: reactive baseline 130 bpm mod var +accels15x15, no decels Toco: no ctx  7/19 MFM [place] breech, MVP 1.1cm  A/P: Mikayla Hicks 35 y.o. G1P0 at [redacted]w[redacted]d HD#30 admitted with PPROM on 6/21, now clinically stable without signs of infection or PTL  PPROM -S/p Latency Antibiotics -S/p BMZ 6/21-6/22 -S/p MFM and NICU consults -Afebrile with WBC of 10.5 on 7/12, Cont to monitor temps -GBS POS, would resume PCN w/ labor if vaginal delivery, but unlikely given breech Fetal Monitoring - Con fetal monitoring with NST q shift- reactive this morning -Weekly [place] fluid and position check, BPP q week at 30w History Spinal Hemangioma -S/p Anesthesia consultation, ok for spinal SCD VTE ppx Antepartum care -s/p Tdap at 22w -s/p negative GDM screen 7/4 -continue inpatient mgmt with goal delivery at [place] 12/29/20 1:48 PM

## 2020-12-30 NOTE — Progress Notes (Signed)
CSW acknowledged consult and met with patient at bedside to provide support.   CSW introduced self and explained role. Patient was welcoming, open, and remained engaged during conversation. CSW and patient discussed patient's hospitalization and emotions associated with it. Patient became tearful at times when speaking about hospitalization. CSW inquired about how patient has been feeling overall, patient shared that she has been sad and feeling all over the place. CSW acknowledged, normalized and validated patient's feelings. CSW actively listened and provided emotional support. Patient reported that her mom and FOB are supports and have been visiting often. Patient shared about her dog visiting yesterday and how helpful it was. Patient reported that she is feeling better today and shared about having lunch with a friend. CSW positively affirmed MOB's supports.   Patient spoke about yesterday being hard hitting one month in the hospital and having frequent interruptions during the night while trying to rest. CSW validated how stressful frequent interruptions can be. CSW agreed to check in with RN to see if patient could possibly get a sign to minimize interruptions, patient agreeable. CSW asked if there was anything that CSW could do to be helpful at this time, patient reported no needs. CSW asked if patient was interested in weekly visits from [place], patient agreeable. CSW agreed to check in with patient weekly and provided patient with CSW contact information. Patient also agreeable to support from [place], [place] agreed to notify [place].   CSW updated unit secretary on patient's request for a sign on door to decrease interruptions, Network engineer agreed to notify patient's RN.   CSW requested that [place] follow up with patient next week.   Abundio Miu, [place] Worker [facility] Cell#: [phone]

## 2020-12-30 NOTE — Progress Notes (Signed)
HD #31 PPROM 6/21; 28.1 wga  Drinking more water, notes  increased fm with better hydration; no vb/ctx; still occ gush or leaking water; getting out into lobby No f/c/s, doing better than yesterday mentally - was getting inturrupted q hr and couldn't sleep   Patient Vitals for the past 24 hrs:  BP Temp Temp src Pulse Resp SpO2  12/30/20 0740 112/61 98.3 F (36.8 C) Oral 86 15 98 %  12/30/20 0009 114/62 98.1 F (36.7 C) Oral 78 18 98 %  12/29/20 2007 (!) 113/58 97.9 F (36.6 C) Oral 75 17 99 %  12/29/20 1536 -- -- -- -- -- 99 %  12/29/20 1535 119/68 98 F (36.7 C) Oral 89 18 99 %  12/29/20 1135 126/77 98.1 F (36.7 C) Oral 93 16 99 %   A&ox3 Nml respirations Abd: soft,nt, gravid LE: no edema, nt bilat  FHT: 120s, 130s with nml variability, +accels, no decels TOCO: no ctx  A/P: Mikayla Hicks 35 y.o. G1P0 at 56w1dHD#30 admitted with PPROM on 6/21, now clinically stable without signs of infection or PTL   PPROM -S/p Latency Antibiotics -S/p BMZ 6/21-6/22 -S/p MFM and NICU consults -Afebrile with WBC of 9.8 7/21, Cont to monitor temps -GBS POS, would resume PCN w/ labor if vaginal delivery, but unlikely given breech Fetal Monitoring - Con fetal monitoring with NST q shift- reactive this morning -Weekly [place] and position check, BPP q week at 30w History Spinal Hemangioma -S/p Anesthesia consultation, ok for spinal SCD VTE ppx Antepartum care -s/p Tdap at 22w -s/p negative GDM screen 7/4 -continue inpatient mgmt with goal delivery at 34w

## 2020-12-30 NOTE — Evaluation (Signed)
Physical Therapy Evaluation Patient Details Name: Mikayla Hicks MRN: [id number] DOB: 1985/10/04 Today's Date: 12/30/2020   History of Present Illness  Pt adm 6/21 with PPROM at [redacted]w[redacted]d PMH - spinal hemangioma with resection in early 2000's.  Clinical Impression  Pt seen for exercise program since she is on extended bedrest for pregnancy complications. Pt instructed in exercises as well as body mechanics for mobility to decr stress on abdomen. Pt instructed on using towel roll between scapulae in supine for thoracic stretch. Also instructed in standing trunk extension. 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 Reassured pt that she will be able to regain her strength and fitness after pregnancy.     Follow Up Recommendations No PT follow up    Equipment Recommendations  None recommended by PT    Recommendations for Other Services       Precautions / Restrictions        Mobility  Bed Mobility Overal bed mobility: Independent                  Transfers Overall transfer level: Independent               General transfer comment: per pt  Ambulation/Gait Ambulation/Gait assistance: Independent           General Gait Details: Per pt. Amb from room to [employer] area in atrium  Stairs            Wheelchair Mobility    Modified Rankin (Stroke Patients Only)       Balance                                             Pertinent Vitals/Pain Pain Assessment: No/denies pain    Home Living Family/patient expects to be discharged to:: Private residence Living Arrangements: Spouse/significant other                    Prior Function Level of Independence: Independent               Hand Dominance        Extremity/Trunk Assessment   Upper Extremity Assessment Upper Extremity Assessment: Overall WFL for tasks assessed    Lower Extremity  Assessment Lower Extremity Assessment: Overall WFL for tasks assessed       Communication   Communication: No difficulties  Cognition Arousal/Alertness: Awake/alert Behavior During Therapy: WFL for tasks assessed/performed Overall Cognitive Status: Within Functional Limits for tasks assessed                                        General Comments      Exercises Antenatal Exercises Ankle Circles/Pumps: AROM;5 reps;Both;Supine Quad Sets: Strengthening;Both;5 reps;Supine Short Arc Quad: AROM;Both;5 reps;Supine Hip ABduction/ADduction: AROM;Both;5 reps;Supine Sidelying Hip Flexor Stretch: PROM;Both;Other reps (comment) (3 reps)   Assessment/Plan    PT Assessment Patent does not need any further PT services  PT Problem List         PT Treatment Interventions      PT Goals (Current goals can be found in the Care Plan section)  Acute Rehab PT Goals PT Goal Formulation: All assessment and education complete, DC therapy    Frequency  Barriers to discharge        Co-evaluation               AM-PAC PT "6 Clicks" Mobility  Outcome Measure Help needed turning from your back to your side while in a flat bed without using bedrails?: None Help needed moving from lying on your back to sitting on the side of a flat bed without using bedrails?: None Help needed moving to and from a bed to a chair (including a wheelchair)?: None Help needed standing up from a chair using your arms (e.g., wheelchair or bedside chair)?: None Help needed to walk in hospital room?: None Help needed climbing 3-5 steps with a railing? : None 6 Click Score: 24    End of Session   Activity Tolerance: Patient tolerated treatment well Patient left: in bed;with call bell/phone within reach   PT Visit Diagnosis: Muscle weakness (generalized) (M62.81)    Time: [id number] PT Time Calculation (min) (ACUTE ONLY): 15 min   Charges:   PT Evaluation $PT Eval Low Complexity: [place] Pager [phone] Office [place] 12/30/2020, 3:33 PM

## 2020-12-31 NOTE — Progress Notes (Signed)
HD #32 PPROM 6/21; 28.2 wks   No complaints. Was getting tired with frequent room visits by staff but was able to sleep well last night.  No UCs.  Slight fluid leak, still clear. NO fever/ chills   Patient Vitals for the past 24 hrs:  BP Temp Temp src Pulse Resp SpO2  12/31/20 0850 119/64 97.9 F (36.6 C) Oral 86 17 100 %  12/30/20 2215 116/62 97.9 F (36.6 C) Oral 73 15 100 %  12/30/20 1931 132/74 97.8 F (36.6 C) Oral 75 17 99 %  12/30/20 1601 (!) 115/58 98.1 F (36.7 C) Oral 75 15 98 %  12/30/20 1129 126/70 98.1 F (36.7 C) Oral 88 16 99 %    A&ox3 Nml respirations Abd: soft,nt, gravid soft uterus  LE: no edema, nt bilat  FHT: 130s with moderate variability, +accels, no decels TOCO: no ctx  Last sono report didn't mention funic presentation, will check with MFM though breech persists   A/P: Mikayla Hicks 35 y.o. G1P0 at 79w2dHD#31 admitted with PPROM on 6/21,  clinically stable without signs of infection or PTL   PPROM -S/p Latency Antibiotics -S/p BMZ 6/21-6/22 -S/p MFM and NICU consults -Afebrile with WBC of 9.8 7/21, Cont to monitor temps -GBS POS, would resume PCN w/ labor if vaginal delivery, but unlikely given breech Fetal Monitoring - Con fetal monitoring with NST q shift- reactive this morning -Weekly [place] and position check, BPP q week at 30w History Spinal Hemangioma -S/p Anesthesia consultation, ok for spinal SCD VTE ppx Antepartum care -s/p Tdap at 22w -s/p negative GDM screen 7/4 -continue inpatient mgmt with goal delivery at [place]

## 2021-01-01 LAB — CBC WITH DIFFERENTIAL/PLATELET
Abs Immature Granulocytes: 0.21 10*3/uL — ABNORMAL HIGH (ref 0.00–0.07)
Basophils Absolute: 0 10*3/uL (ref 0.0–0.1)
Basophils Relative: 0 %
Eosinophils Absolute: 0.1 10*3/uL (ref 0.0–0.5)
Eosinophils Relative: 1 %
HCT: 35.4 % — ABNORMAL LOW (ref 36.0–46.0)
Hemoglobin: 11.7 g/dL — ABNORMAL LOW (ref 12.0–15.0)
Immature Granulocytes: 2 %
Lymphocytes Relative: 15 %
Lymphs Abs: 1.5 10*3/uL (ref 0.7–4.0)
MCH: 27.8 pg (ref 26.0–34.0)
MCHC: 33.1 g/dL (ref 30.0–36.0)
MCV: 84.1 fL (ref 80.0–100.0)
Monocytes Absolute: 0.6 10*3/uL (ref 0.1–1.0)
Monocytes Relative: 6 %
Neutro Abs: 7.7 10*3/uL (ref 1.7–7.7)
Neutrophils Relative %: 76 %
Platelets: 239 10*3/uL (ref 150–400)
RBC: 4.21 MIL/uL (ref 3.87–5.11)
RDW: 14.2 % (ref 11.5–15.5)
WBC: 10.1 10*3/uL (ref 4.0–10.5)
nRBC: 0 % (ref 0.0–0.2)

## 2021-01-01 NOTE — Progress Notes (Signed)
HD #33 PPROM 6/21; 28.3 wks  Breech  No complaints. Slight cramping/ uterine irritability overnight, okay this morning. Slight fluid leak, still clear. NO fever/ chills   Patient Vitals for the past 24 hrs:  BP Temp Temp src Pulse Resp SpO2  01/01/21 1102 121/66 98 F (36.7 C) Oral 73 18 99 %  01/01/21 0823 (!) 110/58 98.3 F (36.8 C) Oral 81 18 99 %  01/01/21 0632 (!) 112/59 98.2 F (36.8 C) Oral 89 16 99 %  12/31/20 2336 -- -- -- -- -- 99 %  12/31/20 2335 113/71 98.1 F (36.7 C) Oral 75 17 99 %  12/31/20 2105 117/73 97.8 F (36.6 C) Oral 73 16 99 %  12/31/20 1618 118/73 98.4 F (36.9 C) Oral 90 18 99 %    A&ox3 Nml respirations Abd: soft,nt, gravid soft uterus  LE: no edema, nt bilat  FHT: 130s with moderate variability, +accels, no decels TOCO: no ctx   A/P: G1P0 at 61w3dHD#32 admitted with PPROM on 6/21,  clinically stable without signs of infection or PTL  Uterine irritability - resolved    PPROM -S/p Latency Antibiotics -S/p BMZ 6/21-6/22 -S/p MFM and NICU consults -Afebrile with WBC of 9.8 7/21, Cont to monitor temps -GBS POS, would resume PCN w/ labor if vaginal delivery, but unlikely given breech  Fetal Monitoring - Con fetal monitoring with NST q shift- reactive this morning -Weekly [place] and position check, BPP q week at 30w  History Spinal Hemangioma -S/p Anesthesia consultation, ok for spinal  SCD VTE ppx Antepartum care -s/p Tdap at 22w -s/p negative GDM screen 7/4 -continue inpatient mgmt with [place] at [place]

## 2021-01-02 NOTE — Plan of Care (Signed)
Continues to trickle scant amounts of amniotic fluid. Denies any pain or vaginal bleeding.

## 2021-01-02 NOTE — Progress Notes (Signed)
Pt. refused type and screen due to arms feeling sore. Pt. educated on the risks and pt. stated understanding. Pt. sates she will comply with next T&S due 7/27 to be drawn with CBC.

## 2021-01-02 NOTE — Progress Notes (Signed)
HD#33 PPROM 6/21 28.4  No vb/ctx, still leaking fluid; +FM No c/o  Patient Vitals for the past 24 hrs:  BP Temp Temp src Pulse Resp SpO2  01/02/21 0832 118/67 98.3 F (36.8 C) Oral 91 18 100 %  01/01/21 2216 115/68 98 F (36.7 C) -- 84 16 99 %  01/01/21 1916 113/67 98.1 F (36.7 C) Oral 76 16 99 %  01/01/21 1720 107/60 98.2 F (36.8 C) Oral 89 17 100 %  01/01/21 1102 121/66 98 F (36.7 C) Oral 73 18 99 %   A&ox3 Nml respirations Abd: soft, nt, gravid LE: no edema, nt bilat  FHT: 130s, nml variability, occ quick variable, no decels, +accels Toco: no ctx  A/P: G1P0 at 49w4dHD#33 admitted with PPROM on 6/21,  clinically stable without signs of infection or PTL   PPROM -S/p Latency Antibiotics -S/p BMZ 6/21-6/22 -S/p MFM and NICU consults -Afebrile with WBC of 10.1 7/24 Cont to monitor temps -GBS POS, would resume PCN w/ labor if vaginal delivery, but unlikely given breech   Fetal Monitoring - Con fetal monitoring with NST q shift- reactive this morning -Weekly [place]fluid and position check, BPP q week at 30w   History Spinal Hemangioma -S/p Anesthesia consultation, ok for spinal   SCD VTE ppx Antepartum care -s/p Tdap at 22w -s/p negative GDM screen 7/4 -continue inpatient mgmt with goal delivery at 34w

## 2021-01-03 ENCOUNTER — Inpatient Hospital Stay (HOSPITAL_BASED_OUTPATIENT_CLINIC_OR_DEPARTMENT_OTHER): Payer: 59

## 2021-01-03 DIAGNOSIS — Z3A28 28 weeks gestation of pregnancy: Secondary | ICD-10-CM | POA: Diagnosis not present

## 2021-01-03 DIAGNOSIS — O43193 Other malformation of placenta, third trimester: Secondary | ICD-10-CM | POA: Diagnosis not present

## 2021-01-03 DIAGNOSIS — O42913 Preterm premature rupture of membranes, unspecified as to length of time between rupture and onset of labor, third trimester: Secondary | ICD-10-CM | POA: Diagnosis not present

## 2021-01-04 LAB — CBC WITH DIFFERENTIAL/PLATELET
Abs Immature Granulocytes: 0.2 10*3/uL — ABNORMAL HIGH (ref 0.00–0.07)
Basophils Absolute: 0.1 10*3/uL (ref 0.0–0.1)
Basophils Relative: 1 %
Eosinophils Absolute: 0.1 10*3/uL (ref 0.0–0.5)
Eosinophils Relative: 1 %
HCT: 37.9 % (ref 36.0–46.0)
Hemoglobin: 12.5 g/dL (ref 12.0–15.0)
Immature Granulocytes: 2 %
Lymphocytes Relative: 16 %
Lymphs Abs: 1.8 10*3/uL (ref 0.7–4.0)
MCH: 28 pg (ref 26.0–34.0)
MCHC: 33 g/dL (ref 30.0–36.0)
MCV: 84.8 fL (ref 80.0–100.0)
Monocytes Absolute: 0.7 10*3/uL (ref 0.1–1.0)
Monocytes Relative: 6 %
Neutro Abs: 8.2 10*3/uL — ABNORMAL HIGH (ref 1.7–7.7)
Neutrophils Relative %: 74 %
Platelets: 262 10*3/uL (ref 150–400)
RBC: 4.47 MIL/uL (ref 3.87–5.11)
RDW: 13.9 % (ref 11.5–15.5)
WBC: 11 10*3/uL — ABNORMAL HIGH (ref 4.0–10.5)
nRBC: 0 % (ref 0.0–0.2)

## 2021-01-04 LAB — TYPE AND SCREEN
ABO/RH(D): A POS
Antibody Screen: NEGATIVE

## 2021-01-04 NOTE — Progress Notes (Addendum)
Pt seen on 7/26 at 11 am -- late note  HD#34 PPROM 6/21 28.5  No vb/ctx, still leaking fluid; +FM No c/o  Patient Vitals for the past 24 hrs:  BP Temp Temp src Pulse Resp SpO2  01/04/21 1607 113/61 98.1 F (36.7 C) Oral 81 18 99 %  01/04/21 1159 108/62 98.1 F (36.7 C) Oral 76 16 99 %  01/04/21 0757 120/71 97.6 F (36.4 C) Oral 70 18 100 %  01/03/21 2317 (!) 109/59 98 F (36.7 C) Oral 78 18 100 %  01/03/21 1936 121/69 98.1 F (36.7 C) Oral 78 18 100 %    A&ox3 Nml respirations Abd: soft, nt, gravid LE: no edema, nt bilat  FHT: 130s, nml variability, occ quick variable, no decels, +accels Toco: no ctx  A/P: G1P0 at 28w 5d HD#34 admitted with PPROM on 6/21,  clinically stable without signs of infection or PTL   PPROM -S/p Latency Antibiotics -S/p BMZ 6/21-6/22 -S/p MFM and NICU consults -Afebrile with WBC of 10.1 7/24 Cont to monitor temps -GBS POS, would resume PCN w/ labor if vaginal delivery, but unlikely given breech   Fetal Monitoring - Con fetal monitoring with NST q shift- reactive this morning -Weekly [place] fluid and position check, BPP q week at 30w   History Spinal Hemangioma -S/p Anesthesia consultation, ok for spinal   SCD VTE ppx Antepartum care -s/p Tdap at 22w -s/p negative GDM screen 7/4 -continue inpatient mgmt with goal delivery at 34w

## 2021-01-04 NOTE — Progress Notes (Signed)
HD #36 PPROM 6/21; 28.6 wks  Breech  No complaints. Good FM. No contractions noted. Slight fluid leak, still clear. NO fever/ chills   Patient Vitals for the past 24 hrs:  BP Temp Temp src Pulse Resp SpO2  01/04/21 0757 120/71 97.6 F (36.4 C) Oral 70 18 100 %  01/03/21 2317 (!) 109/59 98 F (36.7 C) Oral 78 18 100 %  01/03/21 1936 121/69 98.1 F (36.7 C) Oral 78 18 100 %  01/03/21 1626 121/61 98 F (36.7 C) Oral (!) 101 18 100 %  01/03/21 0900 115/65 98 F (36.7 C) Oral 89 18 100 %    A&ox3 Lungs: CTA CV; RRR Abd: soft,nt, gravid soft uterus  LE: no edema, nt bilaterally, no cords Neuro: non focal Skin: intact  FHT: 130s with moderate variability, +accels, no decels TOCO: no ctx Category 1 tracing x 3.   A/P: G1P0 at 3w6dHD#36 admitted with PPROM on 6/21,  clinically stable without signs of infection or PTL  Uterine irritability - resolved    PPROM -S/p Latency Antibiotics -S/p BMZ 6/21-6/22 -S/p MFM and NICU consults -Afebrile with WBC of 9.8 7/21, Cont to monitor temps -GBS POS, would resume PCN w/ labor if vaginal delivery, but unlikely given breech  Fetal Monitoring- Breech - Con fetal monitoring with NST q shift- reactive this morning -Weekly [place] and position check, BPP q week at [place] from 7[place]26( done late last night)  History Spinal Hemangioma -S/p Anesthesia consultation, ok for spinal  SCD VTE ppx Antepartum care -s/p Tdap at 22w -s/p negative GDM screen 7/4 -continue inpatient mgmt with [place] delivery at [place]

## 2021-01-05 ENCOUNTER — Other Ambulatory Visit: Payer: Self-pay | Admitting: Obstetrics and Gynecology

## 2021-01-05 NOTE — Progress Notes (Signed)
Mikayla Hicks 35 y.o. G1P0 at 73w0dHD#37 admitted with PPROM on 6/21  S: Patient doing well, no complaints. Reports good FM. Denies any abdominal pain, cramping, or contractions. LOF unchanged, clear without bleeding or odor. Denies any subjective fevers or chills.   O: Vitals:   01/04/21 1607 01/04/21 1927 01/04/21 2206 01/05/21 0823  BP: 113/61 129/73 (!) 121/58 116/69  Pulse: 81 84 71 86  Resp: '18 18 18 18  '$ Temp: 98.1 F (36.7 C) 98.2 F (36.8 C) (!) 97.2 F (36.2 C) 98 F (36.7 C)  TempSrc: Oral Oral Oral Oral  SpO2: 99% 100% 100% 99%  Weight:      Height:       CBC Latest Ref Rng & Units 01/04/2021 01/01/2021 12/29/2020  WBC 4.0 - 10.5 K/uL 11.0(H) 10.1 9.8  Hemoglobin 12.0 - 15.0 g/dL 12.5 11.7(L) 11.2(L)  Hematocrit 36.0 - 46.0 % 37.9 35.4(L) 34.8(L)  Platelets 150 - 400 K/uL 262 239 240     Physical Exam: General: AAO, NAD Cardiovascular: regular rate Respiratory: normal effort  Abdomen: gravid uterus non-tender to palpation Extremities: no LE edema or evidence of DVT  Fetal Monitoring: NST: reactive baseline 130 bpm mod var +accels, -decels Toco: acontractile  7/26 [place] breech with cord near presenting parts and no measurable fluid  A/P: [name]89y.o. G1P0 at 244w0dD#37 admitted with PPROM on 6/21, now clinically stable without evidence of infection or PTL  PPROM -S/p Latency Antibiotics -S/p BMZ 6/21-6/22 -S/p MFM and NICU consults -Afebrile with WBC of 11.0 on 7/27, Cont to monitor temps -GBS POS, would resume PCN w/ labor if vaginal delivery, but unlikely given breech Fetal Monitoring - Con fetal monitoring with NST q shift- reactive this morning -Weekly USKorealuid and position check, BPP q week at 30w -Last Growth 7/11 EFW 1#15 27% History Spinal Hemangioma -S/p Anesthesia consultation, ok for spinal SCD VTE ppx Antepartum care -s/p Tdap at 22w -s/p negative GDM screen 7/4 -continue inpatient mgmt with goal delivery at  [place]7/28/22 10:08 AM

## 2021-01-05 NOTE — Plan of Care (Signed)
  Problem: Clinical Measurements: Goal: Diagnostic test results will improve Outcome: Progressing   Problem: Clinical Measurements: Goal: Complications related to the disease process, condition or treatment will be avoided or minimized Outcome: Progressing

## 2021-01-06 NOTE — Progress Notes (Signed)
CSW followed up with patient at bedside. CSW and patient discussed patient's week, supports and plans post discharge. Patient presented calm, pleasant and remained engaged during conversation. CSW inquired about any needs/concerns, patient reported none. CSW agreed to follow up with patient next week. Patient thanked CSW for visit.   Abundio Miu, [place] Worker [facility] Cell#: [phone]

## 2021-01-06 NOTE — Progress Notes (Signed)
PPROM 6/21 29.1 weeks Breech   No vb/ctx, still leaking fluid; +FM No c/o  BP 109/68   Pulse 74   Temp 97.7 F (36.5 C) (Oral)   Resp 15   Ht '5\' 11"'$  (1.803 m)   Wt 101.8 kg   LMP 06/18/2020   SpO2 100%   BMI 31.30 kg/m   Patient Vitals for the past 24 hrs:  BP Temp Temp src Pulse Resp SpO2  01/05/21 2345 109/68 -- -- 74 -- --  01/05/21 2300 109/68 97.7 F (36.5 C) Oral 76 15 100 %  01/05/21 1900 117/70 98.2 F (36.8 C) Oral 77 16 100 %  01/05/21 1538 119/70 98.1 F (36.7 C) Oral 79 18 100 %  01/05/21 1125 118/67 98.3 F (36.8 C) Oral 92 18 96 %  01/05/21 0823 116/69 98 F (36.7 C) Oral 86 18 99 %    A&ox3 Nml respirations Abd: soft, nt, gravid LE: no edema, nt bilat  FHT: 130s, moderate variability, occ variables <1 min, +accels Toco: no ctx  A/P: G1P0 at 29.1 wk, admitted with PPROM on 6/21,  clinically stable without signs of infection or PTL   PPROM -S/p Latency Antibiotics -S/p BMZ 6/21-6/22 -S/p MFM and NICU consults -Afebrile with WBC of 10.1 7/24 Cont to monitor temps -GBS POS, would resume PCN w/ labor if vaginal delivery, but unlikely given breech   Fetal Monitoring - Con fetal monitoring with NST q shift- reactive this morning -Weekly [place] fluid and position check, BPP q week at 30w   History Spinal Hemangioma -S/p Anesthesia consultation, ok for spinal   SCD VTE ppx Antepartum care -s/p Tdap at 22w -s/p negative GDM screen 7/4 -continue inpatient mgmt with goal delivery at 34w

## 2021-01-07 LAB — CBC WITH DIFFERENTIAL/PLATELET
Abs Immature Granulocytes: 0.18 10*3/uL — ABNORMAL HIGH (ref 0.00–0.07)
Basophils Absolute: 0.1 10*3/uL (ref 0.0–0.1)
Basophils Relative: 1 %
Eosinophils Absolute: 0.1 10*3/uL (ref 0.0–0.5)
Eosinophils Relative: 1 %
HCT: 34.4 % — ABNORMAL LOW (ref 36.0–46.0)
Hemoglobin: 11.5 g/dL — ABNORMAL LOW (ref 12.0–15.0)
Immature Granulocytes: 2 %
Lymphocytes Relative: 18 %
Lymphs Abs: 1.7 10*3/uL (ref 0.7–4.0)
MCH: 28 pg (ref 26.0–34.0)
MCHC: 33.4 g/dL (ref 30.0–36.0)
MCV: 83.9 fL (ref 80.0–100.0)
Monocytes Absolute: 0.7 10*3/uL (ref 0.1–1.0)
Monocytes Relative: 8 %
Neutro Abs: 6.7 10*3/uL (ref 1.7–7.7)
Neutrophils Relative %: 70 %
Platelets: 230 10*3/uL (ref 150–400)
RBC: 4.1 MIL/uL (ref 3.87–5.11)
RDW: 14 % (ref 11.5–15.5)
WBC: 9.5 10*3/uL (ref 4.0–10.5)
nRBC: 0 % (ref 0.0–0.2)

## 2021-01-07 LAB — TYPE AND SCREEN
ABO/RH(D): A POS
Antibody Screen: NEGATIVE

## 2021-01-07 NOTE — Progress Notes (Signed)
Pt reports occasional, scant loss of fluid without color or odor. Reports good FM, denies pain or sensation of contractions. Charisse Klinefelter, RN

## 2021-01-07 NOTE — Progress Notes (Signed)
PPROM 6/21 29'2weeks Breech   No vb/ctx, still leaking fluid-no odor no blood; +FM No c/o Happy that she is able to see her dog and get outside  BP 109/64 (BP Location: Left Arm)   Pulse 86   Temp 98.1 F (36.7 C) (Oral)   Resp 18   Ht '5\' 11"'$  (1.803 m)   Wt 101.8 kg   LMP 06/18/2020   SpO2 100%   BMI 31.30 kg/m   Patient Vitals for the past 24 hrs:  BP Temp Temp src Pulse Resp SpO2  01/07/21 0735 109/64 98.1 F (36.7 C) Oral 86 18 100 %  01/07/21 0632 108/60 97.6 F (36.4 C) Oral 71 16 100 %  01/06/21 2256 111/65 97.6 F (36.4 C) Oral 70 18 99 %  01/06/21 1959 118/63 98.3 F (36.8 C) Oral 80 18 99 %  01/06/21 1548 105/64 98.1 F (36.7 C) Oral 76 16 100 %  01/06/21 1149 115/64 97.8 F (36.6 C) Oral 95 16 99 %    A&ox3 Cardiovascular: Regular Pulmonary: Clear to auscultation bilaterally Abd: soft, nt, gravid LE: no edema, nt bilat  FHT: 130s, moderate variability, occ variables <1 min, +accels Toco: no ctx  A/P: G1P0 at 29'2 wk, admitted with PPROM on 6/21,  clinically stable without signs of infection or PTL   PPROM -S/p Latency Antibiotics -S/p BMZ 6/21-6/22 -S/p MFM and NICU consults -Afebrile with WBC of 9.5 on July 30. Marland Kitchen  Cont to monitor temps.  CBC with type and screen every 3 days -GBS POS, would resume PCN w/ labor if vaginal delivery, but unlikely given breech   Fetal Monitoring - Con fetal monitoring with NST q shift- reactive this morning -Weekly [place] fluid and position check, BPP q week at 30w   History Spinal Hemangioma -S/p Anesthesia consultation, ok for spinal   SCD VTE ppx Antepartum care -s/p Tdap at 22w -s/p negative GDM screen 7/4 -continue inpatient mgmt with goal delivery at [place] 01/07/2021 10:38 AM

## 2021-01-08 NOTE — Progress Notes (Signed)
PPROM 6/21 29'3weeks Breech   No vb/ctx, some increase cramping last night and today, no pain, no fevers still leaking fluid-no odor no blood; +FM Happy that she is able to see her dog and get outside Met with PT and SW/ therapist. Doing exercises  BP 110/63 (BP Location: Left Arm)   Pulse 77   Temp 98 F (36.7 C) (Oral)   Resp 17   Ht _0  (1.803 m)   Wt 101.8 kg   LMP 06/18/2020   SpO2 100%   BMI 31.30 kg/m   Patient Vitals for the past 24 hrs:  BP Temp Temp src Pulse Resp SpO2  01/08/21 0828 110/63 98 F (36.7 C) Oral 77 17 100 %  01/07/21 2215 109/73 98.2 F (36.8 C) Oral 79 17 100 %  01/07/21 1937 121/68 98.4 F (36.9 C) Oral 81 16 100 %  01/07/21 1532 (!) 115/59 -- -- 85 -- --  01/07/21 1531 (!) 115/59 98.3 F (36.8 C) Oral 90 16 100 %  01/07/21 1219 (!) 110/54 98.2 F (36.8 C) Oral 71 17 100 %    A&ox3 Cardiovascular: Regular Pulmonary: Clear to auscultation bilaterally Abd: soft, nt, gravid LE: no edema, nt bilat  FHT: 130s, moderate variability, occ variables <1 min, +accels Toco: no ctx  A/P: G1P0 at 29'3 wk, admitted with PPROM on 6/21,  clinically stable without signs of infection or PTL   PPROM -S/p Latency Antibiotics -S/p BMZ 6/21-6/22 -S/p MFM and NICU consults -Afebrile with WBC of 9.5 on July 30. Marland Kitchen  Cont to monitor temps.  CBC with type and screen every 3 days -GBS POS, would resume PCN w/ labor if vaginal delivery, but unlikely given breech   Fetal Monitoring - Con fetal monitoring with NST q shift- reactive this morning -Weekly [place] fluid and position check, BPP q week at 30w   History Spinal Hemangioma -S/p Anesthesia consultation, ok for spinal   SCD VTE ppx Antepartum care -s/p Tdap at 22w -s/p negative GDM screen 7/4 -continue inpatient mgmt with goal delivery at [place] 01/08/2021 11:30 AM

## 2021-01-09 MED ORDER — NIFEDIPINE 10 MG PO CAPS
10.0000 mg | ORAL_CAPSULE | Freq: Three times a day (TID) | ORAL | Status: AC
  Administered 2021-01-09 – 2021-01-10 (×3): 10 mg via ORAL
  Filled 2021-01-09 (×3): qty 1

## 2021-01-09 MED ORDER — ONDANSETRON 4 MG PO TBDP
4.0000 mg | ORAL_TABLET | Freq: Once | ORAL | Status: DC
  Filled 2021-01-09 (×3): qty 1

## 2021-01-09 NOTE — Progress Notes (Signed)
HD 44 PPROM 6/21; 29.4 wks  Breech  No complaints. Good FM. No contractions noted. Slight fluid leak, still clear. NO fever/ chills   Patient Vitals for the past 24 hrs:  BP Temp Temp src Pulse Resp SpO2  01/09/21 0835 111/65 98.2 F (36.8 C) -- 83 17 100 %  01/08/21 2227 110/61 97.9 F (36.6 C) Oral 68 17 100 %  01/08/21 2011 123/79 98 F (36.7 C) Oral 90 16 100 %  01/08/21 1152 112/62 98.1 F (36.7 C) Oral 83 16 99 %    A&ox3 Lungs: CTA CV; RRR Abd: soft,nt, gravid soft uterus  LE: no edema, nt bilaterally, no cords Neuro: non focal Skin: intact  FHT: 130s with moderate variability, +accels, no decels TOCO: no ctx Category 1 tracing x 3.   A/P: G1P0 at 96w4dHD#36 admitted with PPROM on 6/21,  clinically stable without signs of infection or PTL  Uterine irritability - resolved    PPROM -S/p Latency Antibiotics -S/p BMZ 6/21-6/22 -S/p MFM and NICU consults -Afebrile with WBC of 9.8 7/21, Cont to monitor temps -GBS POS, would resume PCN w/ labor if vaginal delivery, but unlikely given breech  Fetal Monitoring- Breech - Con fetal monitoring with NST q shift- reactive this morning -Weekly [place] and position check(Tuesdays), BPP q week at 30w  History Spinal Hemangioma -S/p Anesthesia consultation, ok for spinal  SCD VTE ppx Antepartum care -s/p Tdap at 22w -s/p negative GDM screen 7/4 -continue inpatient mgmt with goal delivery at 34w

## 2021-01-09 NOTE — Progress Notes (Signed)
FHT reviewed. RN called for patient having nausea due to drinking a lot of water for contractions.  Plan Zofran 4 mg ODT once and Procardia '10mg'$  po q 8hrs for 3 doses and reassess

## 2021-01-09 NOTE — Plan of Care (Signed)
Continues to have scant leaking of amniotic fluid that has no odor or color.

## 2021-01-10 ENCOUNTER — Inpatient Hospital Stay (HOSPITAL_BASED_OUTPATIENT_CLINIC_OR_DEPARTMENT_OTHER): Payer: 59

## 2021-01-10 DIAGNOSIS — O321XX0 Maternal care for breech presentation, not applicable or unspecified: Secondary | ICD-10-CM | POA: Diagnosis not present

## 2021-01-10 DIAGNOSIS — Z3A29 29 weeks gestation of pregnancy: Secondary | ICD-10-CM | POA: Diagnosis not present

## 2021-01-10 DIAGNOSIS — O42913 Preterm premature rupture of membranes, unspecified as to length of time between rupture and onset of labor, third trimester: Secondary | ICD-10-CM

## 2021-01-10 DIAGNOSIS — O43192 Other malformation of placenta, second trimester: Secondary | ICD-10-CM | POA: Diagnosis not present

## 2021-01-10 LAB — CBC WITH DIFFERENTIAL/PLATELET
Abs Immature Granulocytes: 0.18 10*3/uL — ABNORMAL HIGH (ref 0.00–0.07)
Basophils Absolute: 0 10*3/uL (ref 0.0–0.1)
Basophils Relative: 0 %
Eosinophils Absolute: 0 10*3/uL (ref 0.0–0.5)
Eosinophils Relative: 0 %
HCT: 34.8 % — ABNORMAL LOW (ref 36.0–46.0)
Hemoglobin: 11.5 g/dL — ABNORMAL LOW (ref 12.0–15.0)
Immature Granulocytes: 2 %
Lymphocytes Relative: 15 %
Lymphs Abs: 1.5 10*3/uL (ref 0.7–4.0)
MCH: 27.8 pg (ref 26.0–34.0)
MCHC: 33 g/dL (ref 30.0–36.0)
MCV: 84.1 fL (ref 80.0–100.0)
Monocytes Absolute: 0.6 10*3/uL (ref 0.1–1.0)
Monocytes Relative: 6 %
Neutro Abs: 7.9 10*3/uL — ABNORMAL HIGH (ref 1.7–7.7)
Neutrophils Relative %: 77 %
Platelets: 251 10*3/uL (ref 150–400)
RBC: 4.14 MIL/uL (ref 3.87–5.11)
RDW: 14 % (ref 11.5–15.5)
WBC: 10.3 10*3/uL (ref 4.0–10.5)
nRBC: 0 % (ref 0.0–0.2)

## 2021-01-10 LAB — TYPE AND SCREEN
ABO/RH(D): A POS
Antibody Screen: NEGATIVE

## 2021-01-10 MED ORDER — NIFEDIPINE 10 MG PO CAPS: 10.0000 mg | ORAL_CAPSULE | Freq: Three times a day (TID) | ORAL | Status: DC | PRN

## 2021-01-10 NOTE — Progress Notes (Signed)
HD 45 PPROM 6/21; 29.5 wks  Breech  No complaints. Good FM. Slight fluid leak, still clear. NO fever/ chills.  Noted UCs on toco but didn't feel any.Gave Procardia '10mg'$  q8hrs x 24 hrs  started last evening (s/p 2 doses). No UCs noted today.  Nausea resolved with time last evening and didn't take Zofran. Eating helped, thinks it was related to drinking a lot of water due to UCs   Patient Vitals for the past 24 hrs:  BP Temp Temp src Pulse Resp SpO2  01/10/21 0823 111/68 98.1 F (36.7 C) Oral (!) 118 18 100 %  01/09/21 2215 110/63 98.1 F (36.7 C) Oral 74 17 98 %  01/09/21 1944 115/74 98.1 F (36.7 C) Oral 94 16 98 %    A&ox3 Lungs: CTA CV; RRR Abd: soft,nt, gravid soft uterus  LE: no edema, nt bilaterally, no cords Neuro: non focal Skin: intact  FHT: 130s with moderate variability, +accels, no decels TOCO: no ctx this morning Category 1 tracing x 3.   A/P: G1P0 at 29w5 wks Breech, admitted with PPROM on 6/21,  clinically stable without signs of infection or PTL  Uterine irritability - resolved    PPROM -S/p Latency Antibiotics -S/p BMZ 6/21-6/22 -S/p MFM and NICU consults -Afebrile with WBC of 9.8 7/21, Cont to monitor temps -GBS POS, would resume PCN w/ labor if vaginal delivery, but unlikely given breech  Fetal Monitoring- Breech - Con fetal monitoring with NST q shift- reactive this morning -Weekly [place] fluid and position check(Tuesdays), BPP q week at [place] next week (last on 7/11)   History Spinal Hemangioma -S/p Anesthesia consultation, ok for spinal  SCD VTE ppx Antepartum care -s/p Tdap at 22w -s/p negative GDM screen 7/4 -continue inpatient mgmt with goal delivery at [place]

## 2021-01-11 NOTE — Progress Notes (Signed)
Mikayla Hicks 35 y.o. G1P0 at 43w6dHD#44 admitted with PPROM 6/21  *Late entry note: patient seen and evaluated @ 1300  S: MMontanais doing well today. Denies any return of nausea/abdominal pain since first experienced 2 days ago. LOF unchanged, no complaints of VB or odor. No fevers. Tolerating regular diet and hydrating well. Endorses good FM. Visiting with aunt in the lobby this morning.   O: Vitals:   01/10/21 2303 01/11/21 0831 01/11/21 1137 01/11/21 1601  BP: 118/64 123/70 118/74 113/66  Pulse: 86 78 84 94  Resp: '16 16 16 16  '$ Temp: 98.3 F (36.8 C) 98.1 F (36.7 C) 98.2 F (36.8 C) 98.2 F (36.8 C)  TempSrc: Oral Oral Oral Oral  SpO2: 100% 100% 100% 100%  Weight:      Height:       Physical Exam Deferred  NST: reactive this morning +accelerations, -decelerations Toco: acontractile  [place](8/2) Breech presentation minimal fluid  [name].o. G1P0 at 258w6dD#44 admitted with PPROM on 6/21, now clinically stable without evidence of infection or PTL  PPROM -S/p Latency Antibiotics -S/p BMZ 6/21-6/22 -S/p MFM and NICU consults -Afebrile with WBC of 10.3 on 8/2, Cont to monitor temps -GBS POS, would resume PCN w/ labor if vaginal delivery, but unlikely given breech Fetal Monitoring - Con fetal monitoring with NST q shift- reactive this morning -Weekly [place] and position check, BPP q week at 30w -Last Growth 7/11 EFW 1#15 27% update growth next week History Spinal Hemangioma -S/p Anesthesia consultation, ok for spinal SCD VTE ppx Antepartum care -s/p Tdap at 22w -s/p negative GDM screen 7/4 -continue inpatient mgmt with goal delivery at 34-36w   Vladislav Axelson A Martesha Niedermeier 01/11/21 7:24 PM

## 2021-01-12 NOTE — Progress Notes (Addendum)
35 y.o. G1P0 at 70w6dHD#45  admitted with PPROM 6/21   Iv site painful, asking about replacing/if needs to be replaced No vb, light lof continues, no ctx, +FM  Patient Vitals for the past 24 hrs:  BP Temp Temp src Pulse Resp SpO2  01/12/21 0759 (!) 106/47 98.2 F (36.8 C) Oral 74 18 100 %  01/11/21 1950 124/82 98.1 F (36.7 C) Oral 90 18 100 %  01/11/21 1601 113/66 98.2 F (36.8 C) Oral 94 16 100 %  01/11/21 1137 118/74 98.2 F (36.8 C) Oral 84 16 100 %   A&ox3 Nml respirations Abd: soft,nt, gravid LE: no edema, nt bilat  NSTs reviewed: FHT: 130s, nml variaiblity, +accels, rare variabile TOCO: no ctx  U/s 8/2: breech, anhydramnios  A/P: Mikayla Hicks 35y.o. G1P0 at 30.0d HD#45 admitted with PPROM on 6/21, now clinically stable without evidence of infection or PTL   PPROM - no s/s chorio -S/p Latency Antibiotics -S/p BMZ 6/21-6/22 -S/p MFM and NICU consults -Afebrile with WBC of 10.3 on 8/2, Cont to monitor temps -GBS POS, would resume PCN w/ labor if vaginal delivery, but unlikely given breech Fetal Monitoring - Con fetal monitoring with NST q shift- reactive over last 24 hrs -Weekly [place] and position check, BPP q week begin next wk -Last Growth 7/11 EFW 1#15 27% update growth next week History Spinal Hemangioma -S/p Anesthesia consultation, ok for spinal SCD VTE ppx Antepartum care -s/p Tdap at 22w -s/p negative GDM screen 7/4 -continue inpatient mgmt with goal delivery at 34-36w  6. MCI 7. Plan iv team to help replace iv

## 2021-01-13 LAB — CBC WITH DIFFERENTIAL/PLATELET
Abs Immature Granulocytes: 0.19 10*3/uL — ABNORMAL HIGH (ref 0.00–0.07)
Basophils Absolute: 0 10*3/uL (ref 0.0–0.1)
Basophils Relative: 0 %
Eosinophils Absolute: 0.1 10*3/uL (ref 0.0–0.5)
Eosinophils Relative: 1 %
HCT: 35 % — ABNORMAL LOW (ref 36.0–46.0)
Hemoglobin: 11.7 g/dL — ABNORMAL LOW (ref 12.0–15.0)
Immature Granulocytes: 2 %
Lymphocytes Relative: 16 %
Lymphs Abs: 1.8 10*3/uL (ref 0.7–4.0)
MCH: 28.1 pg (ref 26.0–34.0)
MCHC: 33.4 g/dL (ref 30.0–36.0)
MCV: 84.1 fL (ref 80.0–100.0)
Monocytes Absolute: 0.9 10*3/uL (ref 0.1–1.0)
Monocytes Relative: 7 %
Neutro Abs: 8.9 10*3/uL — ABNORMAL HIGH (ref 1.7–7.7)
Neutrophils Relative %: 74 %
Platelets: 251 10*3/uL (ref 150–400)
RBC: 4.16 MIL/uL (ref 3.87–5.11)
RDW: 14.1 % (ref 11.5–15.5)
WBC: 11.9 10*3/uL — ABNORMAL HIGH (ref 4.0–10.5)
nRBC: 0 % (ref 0.0–0.2)

## 2021-01-13 LAB — TYPE AND SCREEN
ABO/RH(D): A POS
Antibody Screen: NEGATIVE

## 2021-01-13 NOTE — Progress Notes (Signed)
35 y.o. G1P0 at 84w1dHD#46 admitted with PPROM 6/21  No concerns No vb, light lof continues, no ctx, +FM  Patient Vitals for the past 24 hrs:  BP Temp Temp src Pulse Resp SpO2  01/13/21 0841 120/70 98.2 F (36.8 C) Oral 83 17 98 %  01/12/21 2312 133/73 98 F (36.7 C) Oral 68 18 97 %  01/12/21 1933 134/71 97.8 F (36.6 C) Oral 82 18 100 %  01/12/21 1522 133/72 98.2 F (36.8 C) Oral 86 18 100 %  01/12/21 1331 (!) 120/59 98.1 F (36.7 C) Oral (!) 114 18 96 %    A&ox3 Abd: soft,nt, gravid LE: no edema, nt bilat  NSTs reviewed: FHT: 130s, nml variaiblity, +accels, rare variabile TOCO: no ctx  U/s 8/2: breech, anhydramnios  A/P: Mikayla Hicks 35y.o. G1P0 at 30.1d HD#46 admitted with PPROM on 6/21, now clinically stable without evidence of infection or PTL   PPROM - no s/s chorio -S/p Latency Antibiotics -S/p BMZ 6/21-6/22 -S/p MFM and NICU consults -Afebrile with WBC of 10.3 on 8/2, Cont to monitor temps -GBS POS, would resume PCN w/ labor if vaginal delivery, but unlikely given breech Fetal Monitoring - Con fetal monitoring with NST q shift- reactive over last 24 hrs -Weekly [place] and position check, BPP q week begin next wk -Last Growth 7/11 EFW 1#15 27% update growth next week History Spinal Hemangioma -S/p Anesthesia consultation, ok for spinal SCD VTE ppx Antepartum care -s/p Tdap at 22w -s/p negative GDM screen 7/4 -continue inpatient mgmt with goal delivery at 34-36w  6. MCI  Mikayla Dach8/10/2020 10:46 AM

## 2021-01-13 NOTE — Progress Notes (Signed)
CSW followed up with patient at bedside. CSW and patient discussed patient's week, supports and upcoming family beach trip. Patient became tearful when speaking about upcoming trip, CSW validated MOB's feelings of sadness surrounding missing annual trip. CSW and patient discussed past trips and future trips. Patient presented calm, pleasant and remained engaged during conversation. CSW inquired about any needs/concerns, patient reported none. CSW agreed to follow up with patient next week. Patient thanked CSW for visit.   Abundio Miu, [place] Worker [facility] Cell#: [phone]

## 2021-01-14 NOTE — Plan of Care (Signed)
  Problem: Clinical Measurements: Goal: Diagnostic test results will improve Outcome: Progressing   

## 2021-01-14 NOTE — Progress Notes (Signed)
HD 49 PPROM 6/21; 30.2 wks  Breech  No complaints. Good FM. No contractions noted. Slight fluid leak, still clear. NO fever/ chills   Patient Vitals for the past 24 hrs:  BP Temp Temp src Pulse Resp SpO2  01/14/21 1214 (!) 91/44 98.5 F (36.9 C) Oral 75 18 --  01/14/21 0837 111/67 98.3 F (36.8 C) Oral 87 17 98 %  01/13/21 1923 116/62 98 F (36.7 C) Oral 73 18 --  01/13/21 1700 (!) 104/56 -- -- 72 -- 99 %  01/13/21 1659 (!) 98/49 -- -- 76 -- --  01/13/21 1658 (!) 104/56 98 F (36.7 C) Oral 70 18 100 %    A&ox3 Lungs: CTA CV; RRR Abd: soft,nt, gravid soft uterus  LE: no edema, nt bilaterally, no cords Neuro: non focal Skin: intact  FHT: 132s with moderate variability, +accels, rare non recurrent variable decels TOCO: no ctx Category 1 tracing x 3.   A/P: G1P0 at 22w2dHD#47 admitted with PPROM on 6/21,  clinically stable without signs of infection or PTL  Uterine irritability - resolved    PPROM -S/p Latency Antibiotics -S/p BMZ 6/21-6/22 -S/p MFM and NICU consults -Afebrile with WBC of 9.8 7/21, Cont to monitor temps -GBS POS, would resume PCN w/ labor if vaginal delivery, but unlikely given breech  Fetal Monitoring- Breech - Con fetal monitoring with NST q shift- reactive this morning -Weekly [place] and position check(Tuesdays), BPP q week and growth sono next week  History Spinal Hemangioma -S/p Anesthesia consultation, ok for spinal  SCD VTE ppx Antepartum care -s/p Tdap at 22w -s/p negative GDM screen 7/4 -continue inpatient mgmt with goal delivery at 35w

## 2021-01-15 NOTE — Progress Notes (Signed)
HD 50 PPROM 6/21; 30.3 wks  Breech  No complaints. Good FM. No contractions noted. Slight fluid leak, still clear. NO fever/ chills   Patient Vitals for the past 24 hrs:  BP Temp Temp src Pulse Resp SpO2  01/15/21 1205 134/73 98 F (36.7 C) Oral (!) 120 20 99 %  01/15/21 0823 121/73 98.4 F (36.9 C) Oral 90 18 100 %  01/14/21 1955 116/68 98.5 F (36.9 C) -- 75 18 99 %  01/14/21 1535 116/68 98.5 F (36.9 C) Oral 84 18 99 %  01/14/21 1214 (!) 91/44 98.5 F (36.9 C) Oral 75 18 --    NCAT Neck: supple with FROM Lungs: CTA CV; RRR Abd: soft,nt, gravid soft uterus  LE: no edema, nt bilaterally, no cords Neuro: non focal Skin: intact  FHT: 140s with moderate variability, +accels, rare non recurrent variable decels TOCO: no ctx Category 1 tracing x 3.   A/P: G1P0 at [redacted]w[redacted]d admitted with PPROM on 6/21,  clinically stable without signs of infection or PTL    PPROM -S/p Latency Antibiotics -S/p BMZ 6/21-6/22 -S/p MFM and NICU consults -Afebrile , no s/s chorio -GBS POS, would resume PCN w/ labor if vaginal delivery, but unlikely given breech  Fetal Monitoring- Breech - Con fetal monitoring with NST q shift- reactive this morning -Weekly [place] and position check(Tuesdays), BPP q week and growth sono next week  History Spinal Hemangioma -S/p Anesthesia consultation, ok for spinal  SCD VTE ppx Antepartum care -s/p Tdap  -s/p negative GDM screen 7/4 -continue inpatient mgmt with goal delivery at 35w

## 2021-01-16 LAB — CBC WITH DIFFERENTIAL/PLATELET
Abs Immature Granulocytes: 0.18 10*3/uL — ABNORMAL HIGH (ref 0.00–0.07)
Basophils Absolute: 0.1 10*3/uL (ref 0.0–0.1)
Basophils Relative: 1 %
Eosinophils Absolute: 0 10*3/uL (ref 0.0–0.5)
Eosinophils Relative: 0 %
HCT: 36.2 % (ref 36.0–46.0)
Hemoglobin: 12 g/dL (ref 12.0–15.0)
Immature Granulocytes: 2 %
Lymphocytes Relative: 15 %
Lymphs Abs: 1.6 10*3/uL (ref 0.7–4.0)
MCH: 27.9 pg (ref 26.0–34.0)
MCHC: 33.1 g/dL (ref 30.0–36.0)
MCV: 84.2 fL (ref 80.0–100.0)
Monocytes Absolute: 0.4 10*3/uL (ref 0.1–1.0)
Monocytes Relative: 4 %
Neutro Abs: 8.1 10*3/uL — ABNORMAL HIGH (ref 1.7–7.7)
Neutrophils Relative %: 78 %
Platelets: 266 10*3/uL (ref 150–400)
RBC: 4.3 MIL/uL (ref 3.87–5.11)
RDW: 14.3 % (ref 11.5–15.5)
WBC: 10.4 10*3/uL (ref 4.0–10.5)
nRBC: 0 % (ref 0.0–0.2)

## 2021-01-16 LAB — TYPE AND SCREEN
ABO/RH(D): A POS
Antibody Screen: NEGATIVE

## 2021-01-16 NOTE — Progress Notes (Signed)
Chaplain asked open ended questions to facilitate emotional expression and story telling. Mikayla Hicks shared the difficulty of this long hospitalization. She is hopeful she will make it to her induction date of September 8th. Chaplain normalized feelings of grief for all that Mikayla Hicks has lost from a typical pregancy, including time with family, opportunities to do fun things to celebrate her pregnancy, and space to mentally transition from a life of independence to life of motherhood. Chaplain affirmed the care and advocacy she has demonstrated for her son Mikayla Hicks and offered space to process all of the complicated things she is feeling. Pt has a good support system and acknowledged the value in releasing pent up feelings through tears as she needs.  Please page as further needs arise.  Donald Prose. Elyn Peers, M.Div. [facility] Chaplain Pager [phone] Office [phone]     01/16/21 1400  Clinical Encounter Type  Visited With Patient  Visit Type Psychological support;Spiritual support  Stress Factors  Patient Stress Factors Loss of control

## 2021-01-16 NOTE — Progress Notes (Signed)
Mikayla Hicks Shorts 35 y.o. G1P0 at 88w4dHD#49 admitted with PPROM 6/21  S: Patient doing well no complaints. Endorses good FM. Denies any CTXs, abdominal pain, or cramping. LOF unchanged, smaller gushes clear, no color, no odor, no VB. No subjective fevers or chills.   FRomeo Rabonat bedside today. Discussing delivery planning, patient hopeful to make it to 35 weeks and minimize/avoid NICU time as much as possible.  O: Vitals:   01/15/21 1945 01/16/21 0645 01/16/21 0837 01/16/21 1053  BP: 126/80 115/75 127/80 133/73  Pulse: 78 89 91 83  Resp: '18 16 16 17  '$ Temp: 98.2 F (36.8 C) 98.1 F (36.7 C) 98.3 F (36.8 C) 98.5 F (36.9 C)  TempSrc: Oral Oral Oral Oral  SpO2: 100% 100% 100% 96%  Weight:      Height:       Physical Exam: General: AAO, NAD Cardiovascular: normal rate Respiratory: normal effort Abdomen: gravid uterus c/w GA, non tender Extremities: no evidence of DVT  Fetal Monitoring: NST: reactive this morning baseline 125 bpm mod var +accels 15x15, no decels Toco: acontractile  [place](8/2) breech presentation minimal fluid  Mikayla/P: Mikayla Hicks 35y.o. G1P0 at 318w4dD#49 admitted with PPROM on 6/21, now clinically stable with no evidence of infection or PTL  PPROM -S/p Latency Antibiotics -S/p BMZ 6/21-6/22 -S/p MFM and NICU consults -Afebrile with WBC of 10.4 on 8/8, Cont to monitor temps -GBS POS, would resume PCN w/ labor if vaginal delivery, but unlikely given breech Fetal Monitoring - Con fetal monitoring with NST q shift- reactive this morning -Weekly [place]PP -Last Growth 7/11 EFW 1#15 27% update growth with tmw scan History Spinal Hemangioma -S/p Anesthesia consultation, ok for spinal SCD VTE ppx Antepartum care -s/p Tdap at 22w -s/p negative GDM screen 7/4 -continue inpatient mgmt with goal delivery at 34-36w. We discussed risks/benefits of delivery timing. Plan to request 9/8 per patient request. We discussed indications for earlier delivery and  possible need to change timing and patient understands   Mikayla Hicks Mikayla Hicks 01/16/21 12:01 PM

## 2021-01-17 ENCOUNTER — Inpatient Hospital Stay (HOSPITAL_BASED_OUTPATIENT_CLINIC_OR_DEPARTMENT_OTHER): Payer: 59

## 2021-01-17 DIAGNOSIS — O42913 Preterm premature rupture of membranes, unspecified as to length of time between rupture and onset of labor, third trimester: Secondary | ICD-10-CM | POA: Diagnosis not present

## 2021-01-17 DIAGNOSIS — O43192 Other malformation of placenta, second trimester: Secondary | ICD-10-CM

## 2021-01-17 DIAGNOSIS — O321XX0 Maternal care for breech presentation, not applicable or unspecified: Secondary | ICD-10-CM | POA: Diagnosis not present

## 2021-01-17 DIAGNOSIS — Z3A30 30 weeks gestation of pregnancy: Secondary | ICD-10-CM

## 2021-01-17 NOTE — Progress Notes (Signed)
HD 52 PPROM 6/21; 30.5wks  Breech  No complaints. Good FM. No contractions noted. Slight fluid leak, still clear. NO fever/ chills No abd pain or tenderness reported.  Patient Vitals for the past 24 hrs:  BP Temp Temp src Pulse Resp SpO2  01/17/21 0814 126/71 98.1 F (36.7 C) Oral 88 19 99 %  01/16/21 2316 124/76 98.2 F (36.8 C) Oral 86 18 100 %  01/16/21 1937 125/67 98.1 F (36.7 C) Oral 80 18 100 %  01/16/21 1600 131/75 98.3 F (36.8 C) Oral 88 16 100 %  01/16/21 1053 133/73 98.5 F (36.9 C) Oral 83 17 96 %    NCAT Neck: supple with FROM Lungs: CTA CV; RRR Abd: soft,nt, gravid soft uterus  LE: no edema, nt bilaterally, no cords Neuro: non focal Skin: intact  FHT: 130s with moderate variability, +accels, rare non recurrent variable decels TOCO: no ctx Category 1 tracing x 3.   A/P: G1P0 at [redacted]w[redacted]d admitted with PPROM on 6/21,  clinically stable without signs of infection or PTL    PPROM -S/p Latency Antibiotics -S/p BMZ 6/21-6/22 -S/p MFM and NICU consults -Afebrile , no s/s chorio -GBS POS, would resume PCN w/ labor if vaginal delivery, but unlikely given breech  Fetal Monitoring- Breech - Con fetal monitoring with NST q shift- reactive this morning -Weekly [place] and position check- prelimary report today c/w minimal fluid, AGA and breech.  History Spinal Hemangioma -S/p Anesthesia consultation, ok for spinal  SCD VTE ppx Antepartum care -s/p Tdap  -s/p negative GDM screen 7/4 -continue inpatient mgmt with goal delivery at 35w

## 2021-01-18 NOTE — Progress Notes (Addendum)
HD 53 PPROM 6/21; 30.6wks  Breech  No complaints. Good FM. No contractions noted. Slight fluid leak, still clear. NO fever/ chills No abd pain or tenderness reported.  Patient Vitals for the past 24 hrs:  BP Temp Temp src Pulse Resp SpO2  01/18/21 0811 (!) 103/50 98.2 F (36.8 C) Oral 73 18 98 %  01/17/21 2328 (!) 106/55 98 F (36.7 C) Oral 75 -- 98 %  01/17/21 1923 127/71 98.7 F (37.1 C) Oral 82 18 100 %  01/17/21 1546 133/71 98.3 F (36.8 C) Oral 84 18 98 %  01/17/21 1133 110/66 98.1 F (36.7 C) Oral 87 19 96 %    NAD Abd: soft,nt, gravid soft uterus  LE: no edema, nt bilaterally Neuro: non focal  FHT: 130s with moderate variability, +accels, rare non recurrent variable decels. Reactive NST x 3 TOCO: no ctx  Growth sono 01/17/21- EFW 3'2" at 14%, AC at 60%, AFI 2 cm. Breech. Anterior placenta.   A/P: G1P0 at 30w6 d  admitted with PPROM on 6/21,  clinically stable without signs of infection or PTL    PPROM -S/p Latency Antibiotics -S/p BMZ 6/21-6/22 -S/p MFM and NICU consults -Afebrile , no s/s chorio -GBS POS, would resume PCN w/ labor if vaginal delivery, but unlikely given breech  Fetal Monitoring- Breech - Con fetal monitoring with NST q shift- reactive this morning -Weekly [place] fluid and position check- prelimary report today c/w minimal fluid, AGA and breech.  History Spinal Hemangioma -S/p Anesthesia consultation, ok for spinal  SCD VTE ppx Antepartum care -s/p Tdap  -s/p negative GDM screen 7/4 -continue inpatient mgmt with goal delivery at 35wk by C/section   Azucena Fallen MD

## 2021-01-19 ENCOUNTER — Other Ambulatory Visit: Payer: Self-pay | Admitting: Obstetrics and Gynecology

## 2021-01-19 LAB — CBC WITH DIFFERENTIAL/PLATELET
Abs Immature Granulocytes: 0.13 10*3/uL — ABNORMAL HIGH (ref 0.00–0.07)
Basophils Absolute: 0 10*3/uL (ref 0.0–0.1)
Basophils Relative: 0 %
Eosinophils Absolute: 0.1 10*3/uL (ref 0.0–0.5)
Eosinophils Relative: 1 %
HCT: 36.2 % (ref 36.0–46.0)
Hemoglobin: 11.9 g/dL — ABNORMAL LOW (ref 12.0–15.0)
Immature Granulocytes: 1 %
Lymphocytes Relative: 17 %
Lymphs Abs: 1.7 10*3/uL (ref 0.7–4.0)
MCH: 27.5 pg (ref 26.0–34.0)
MCHC: 32.9 g/dL (ref 30.0–36.0)
MCV: 83.8 fL (ref 80.0–100.0)
Monocytes Absolute: 0.7 10*3/uL (ref 0.1–1.0)
Monocytes Relative: 7 %
Neutro Abs: 7.5 10*3/uL (ref 1.7–7.7)
Neutrophils Relative %: 74 %
Platelets: 257 10*3/uL (ref 150–400)
RBC: 4.32 MIL/uL (ref 3.87–5.11)
RDW: 14.1 % (ref 11.5–15.5)
WBC: 10.1 10*3/uL (ref 4.0–10.5)
nRBC: 0 % (ref 0.0–0.2)

## 2021-01-19 LAB — TYPE AND SCREEN
ABO/RH(D): A POS
Antibody Screen: NEGATIVE

## 2021-01-19 NOTE — Progress Notes (Signed)
HD 54 PPROM 6/21, 31wga Breech  Still occ leaking, no vb, ctx; +FM Mood -up and down days, overall doing ok, speaks with case worker/chaplain, gets out of room daily   Patient Vitals for the past 24 hrs:  BP Temp Temp src Pulse Resp SpO2  01/19/21 0918 128/77 98.2 F (36.8 C) Oral 80 18 99 %  01/18/21 2257 125/62 98.2 F (36.8 C) Oral 70 -- 99 %  01/18/21 2001 (!) 120/55 98.7 F (37.1 C) Oral 97 18 100 %  01/18/21 1607 114/72 98.6 F (37 C) Oral 73 18 97 %  01/18/21 1112 127/75 98 F (36.7 C) Oral 88 17 98 %   A&ox3 Nml respirations Abd: soft, nt, gravid LE: no edema, nt bilat  FHT: 120s, nml variability, +accels, no decels, few quick variables/random TOCO: couple ctx (not felt by pt) in last 24 hrs  U/s: 8/9: EFW 3'2" at 14%, AC at 60%, AFI 2 cm. Breech. Anterior placenta  CBC Latest Ref Rng & Units 01/19/2021 01/16/2021 01/13/2021  WBC 4.0 - 10.5 K/uL 10.1 10.4 11.9(H)  Hemoglobin 12.0 - 15.0 g/dL 11.9(L) 12.0 11.7(L)  Hematocrit 36.0 - 46.0 % 36.2 36.2 35.0(L)  Platelets 150 - 400 K/uL 257 266 251     A/P: G1P0 at 31wga admitted with PPROM on 6/21,  clinically stable without signs of infection or PTL    PPROM -S/p Latency Antibiotics -S/p BMZ 6/21-6/22 -S/p MFM and NICU consults -Afebrile , no s/s chorio -GBS POS, would resume PCN w/ labor if vaginal delivery, but unlikely given breech   Fetal Monitoring- Breech - Con fetal monitoring with NST q shift- reactive, fetal status reassuring -Weekly [place] fluid and position check- growth yesterday, efw 14%, breech, anterior placenta, afi 2cm   History Spinal Hemangioma -S/p Anesthesia consultation, ok for spinal   SCD VTE ppx Antepartum care -s/p Tdap  -s/p negative GDM screen 7/4 -continue inpatient mgmt with goal delivery at 35wk by C/section with Dr Lanny Cramp

## 2021-01-20 NOTE — Progress Notes (Signed)
HD 55 PPROM 6/21, 31'1wga Breech  Still occ leaking, no vb, ctx; +FM gets out of room daily No fevers No HA, no CP, no SOB   Patient Vitals for the past 24 hrs:  BP Temp Temp src Pulse Resp SpO2  01/20/21 1548 (!) 114/93 98.4 F (36.9 C) Oral (!) 121 16 97 %  01/20/21 1121 124/75 98.5 F (36.9 C) Oral (!) 107 16 98 %  01/20/21 1120 -- -- -- -- -- 98 %  01/20/21 0911 132/71 98 F (36.7 C) Oral (!) 103 16 98 %  01/19/21 1936 -- -- -- -- -- 98 %  01/19/21 1931 (!) 99/56 98.2 F (36.8 C) Oral 85 16 98 %    A&ox3 Nml respirations Abd: soft, nt, gravid LE: no edema, nt bilat  FHT: 120s, 10 beat variability, +accels, no decels, no decels TOCO: none  U/s: 8/9: EFW 3'2" at 14%, AC at 60%, AFI 2 cm. Breech. Anterior placenta  CBC Latest Ref Rng & Units 01/19/2021 01/16/2021 01/13/2021  WBC 4.0 - 10.5 K/uL 10.1 10.4 11.9(H)  Hemoglobin 12.0 - 15.0 g/dL 11.9(L) 12.0 11.7(L)  Hematocrit 36.0 - 46.0 % 36.2 36.2 35.0(L)  Platelets 150 - 400 K/uL 257 266 251     A/P: G1P0 at [facility] admitted with PPROM on 6/21,  clinically stable without signs of infection or PTL    PPROM -S/p Latency Antibiotics -S/p BMZ 6/21-6/22 -S/p MFM and NICU consults -Afebrile , no s/s chorio -GBS POS, would resume PCN w/ labor if vaginal delivery, but unlikely given breech   Fetal Monitoring- Breech - Con fetal monitoring with NST q shift- reactive, fetal status reassuring -Weekly [place] fluid and position check- growth yesterday, efw 14%, breech, anterior placenta, afi 2cm   History Spinal Hemangioma -S/p Anesthesia consultation, ok for spinal   SCD VTE ppx Antepartum care -s/p Tdap  -s/p negative GDM screen 7/4 -continue inpatient mgmt with goal delivery at 35wk by C/section with Dr Robbie Lis A Shion Bluestein 01/20/2021 4:04 PM

## 2021-01-21 NOTE — Progress Notes (Signed)
Mikayla Hicks 35 y.o. G1P0 at 40w2dHD#54 admitted with PPROM on 6/21  S: Patient doing well no complaints. Feeling good mvmt from baby. Continued LOF, smaller amounts but more frequent throughout the day, no color or odor. No VB. No CTXs or abdominal pain. No fevers or chills.   States she is coping emotionally well, some days better than others. Trying to keep herself occupied with taking trips to patio, reading, visitors, etc. Does mention concern about minimal weight gain since being here. Has been eating regular meals with snacks, denies any N/V  O: Vitals:   01/20/21 1942 01/20/21 2214 01/21/21 0812 01/21/21 0839  BP: 121/76 114/60 118/67   Pulse: 98 71 95   Resp: '18 20 20   '$ Temp: 99.4 F (37.4 C) 98.4 F (36.9 C) 98.6 F (37 C)   TempSrc: Oral Oral Oral   SpO2: 99% 98% 98%   Weight:    101.3 kg  Height:       CBC Latest Ref Rng & Units 01/19/2021 01/16/2021 01/13/2021  WBC 4.0 - 10.5 K/uL 10.1 10.4 11.9(H)  Hemoglobin 12.0 - 15.0 g/dL 11.9(L) 12.0 11.7(L)  Hematocrit 36.0 - 46.0 % 36.2 36.2 35.0(L)  Platelets 150 - 400 K/uL 257 266 251    Physical Exam: General: AAO, NAD Cardiovascular: normal rate Respiratory: normal effort Abdomen: gravid uterus c/w GA, non tender Extremities: no evidence of DVT   Fetal Monitoring: NST: reactive this morning baseline 130 bpm mod var +accels 15x15, no decels Toco: acontractile  [name]: breech, EFW 3#2 (14%)  A/P: Mikayla Hicks 35y.o. G1P0 at 389w2dD#54 admitted with PPROM on 6/21, now clinically stable without signs of infection or PTL  PPROM -S/p Latency Antibiotics -S/p BMZ 6/21-6/22 -S/p MFM and NICU consults -Afebrile with WBC of 10.1 on 8/11, Cont to monitor temps -GBS POS, would resume PCN w/ labor if vaginal delivery, but unlikely given breech Fetal Monitoring - Con fetal monitoring with NST q shift- reactive this morning -Weekly [place] -Last Growth 8/9 EFW 14% History Spinal Hemangioma -S/p Anesthesia  consultation, ok for spinal SCD VTE ppx Antepartum care -s/p Tdap at 22w -s/p negative GDM screen 7/4 -scheduled for primary c/s 9/9 unless signs of labor or infection develop before then -encourage regular diet and will start weekly weights, pt reassured of fetal growth   Lizet Kelso A Ladarien Beeks 01/21/21 10:40 AM

## 2021-01-22 LAB — TYPE AND SCREEN
ABO/RH(D): A POS
Antibody Screen: NEGATIVE

## 2021-01-22 LAB — CBC WITH DIFFERENTIAL/PLATELET
Abs Immature Granulocytes: 0.2 10*3/uL — ABNORMAL HIGH (ref 0.00–0.07)
Basophils Absolute: 0 10*3/uL (ref 0.0–0.1)
Basophils Relative: 0 %
Eosinophils Absolute: 0.1 10*3/uL (ref 0.0–0.5)
Eosinophils Relative: 1 %
HCT: 36.1 % (ref 36.0–46.0)
Hemoglobin: 12.3 g/dL (ref 12.0–15.0)
Immature Granulocytes: 2 %
Lymphocytes Relative: 18 %
Lymphs Abs: 2 10*3/uL (ref 0.7–4.0)
MCH: 28.4 pg (ref 26.0–34.0)
MCHC: 34.1 g/dL (ref 30.0–36.0)
MCV: 83.4 fL (ref 80.0–100.0)
Monocytes Absolute: 0.8 10*3/uL (ref 0.1–1.0)
Monocytes Relative: 7 %
Neutro Abs: 8 10*3/uL — ABNORMAL HIGH (ref 1.7–7.7)
Neutrophils Relative %: 72 %
Platelets: 253 10*3/uL (ref 150–400)
RBC: 4.33 MIL/uL (ref 3.87–5.11)
RDW: 14 % (ref 11.5–15.5)
WBC: 11.2 10*3/uL — ABNORMAL HIGH (ref 4.0–10.5)
nRBC: 0 % (ref 0.0–0.2)

## 2021-01-22 NOTE — Progress Notes (Signed)
Mikayla Hicks 35 y.o. G1P0 at 47w3dHD#55 admitted with PPROM on 6/21  S: Pt doing well today no physical complaints. Reports good FM. Denies any cramping, abdominal pain, or ctxs. LOF same clear, no odor, no VB.   Patient is in good spirits today, fiance WGwyndolyn Saxonis at bedside and brought in their dog 'Pumbaa'  O: Vitals:   01/21/21 1506 01/21/21 1959 01/21/21 2223 01/22/21 0849  BP: 116/68 129/77 112/66 123/75  Pulse: (!) 101 77 70 84  Resp: '18 16 17 17  '$ Temp: 98 F (36.7 C) 98.3 F (36.8 C) 98.1 F (36.7 C) 98.6 F (37 C)  TempSrc: Oral Oral Oral Oral  SpO2: 95% 99% 98% 99%  Weight:      Height:       CBC Latest Ref Rng & Units 01/22/2021 01/19/2021 01/16/2021  WBC 4.0 - 10.5 K/uL 11.2(H) 10.1 10.4  Hemoglobin 12.0 - 15.0 g/dL 12.3 11.9(L) 12.0  Hematocrit 36.0 - 46.0 % 36.1 36.2 36.2  Platelets 150 - 400 K/uL 253 257 266      Physical Exam: General: AAO, NAD Cardiovascular: normal rate Respiratory: normal effort Abdomen: gravid uterus c/w GA, non tender Extremities: no evidence of DVT   Fetal Monitoring: NST: reactive this morning baseline 130 bpm mod var +accels 15x15, no decels Toco: acontractile   [name]: breech, EFW 3#2 (14%)  A/P: MSaretta SadekTurriff 35y.o. G1P0 at 343w3dD#55 admitted with PPROM on 6/21, now clinically stable without signs of infection or PTL  PPROM -S/p Latency Antibiotics -S/p BMZ 6/21-6/22 -S/p MFM and NICU consults -Afebrile with WBC today of 11.2, Cont to monitor temps -GBS POS, would resume PCN w/ labor if vaginal delivery, but unlikely given breech Fetal Monitoring - Con fetal monitoring with NST q shift- reactive this morning -Weekly [place]PP -Last Growth 8/9 EFW 14% History Spinal Hemangioma -S/p Anesthesia consultation, ok for spinal SCD VTE ppx Antepartum care -s/p Tdap at 22w -s/p negative GDM screen 7/4 -scheduled for primary c/s 9/9 unless signs of labor or infection develop before then   Anhelica Fowers A  Nagi Furio 01/22/21 12:16 PM

## 2021-01-22 NOTE — Lactation Note (Signed)
Lactation Consultation Note NICU [facility] consulted with prenatal patient who expects to deliver at 35+1. I recommended contacting insurance provider to obtain pump prior to delivery. I provided NICU book and lactation services booklet. Patient asked several questions regarding bf'ing. All questions and concerns addressed. Will plan f/u prn.   I provided mother with [employer] website and online community breastfeeding support group information.   Patient Name: Mikayla Hicks [id number] Date: 01/22/2021   Age:35 y.o.  Maternal Data  + breast changes during puberty and pregnancy No hx breast surgery / trauma Scheduled c/s 02-17-2021 [facility] ([place])  Gwynne Edinger, [place] IBCLC 01/22/2021, 6:41 PM

## 2021-01-23 NOTE — Progress Notes (Signed)
Mikayla Hicks 35 y.o. G1P0 at 40w4dHD#56 admitted with PPROM on 6/21  S: Pt notes feeling "a little off" today. States more fatigued and sleeping more than usual. Having more trouble getting comfortable. No fevers, no ctx, no abdominal pain. No change to clear fluid leaking.   Reports good FM. Denies any cramping, abdominal pain, or ctxs. LOF same clear, no odor, no VB.   O: Vitals:   01/22/21 0849 01/22/21 1649 01/22/21 2240 01/23/21 0814  BP: 123/75 (!) 107/59 120/70 115/71  Pulse: 84 73 77 88  Resp: '17 17 16 16  '$ Temp: 98.6 F (37 C) 98 F (36.7 C) 98.3 F (36.8 C) 98.2 F (36.8 C)  TempSrc: Oral Oral Oral Oral  SpO2: 99% 99% 98% 99%  Weight:      Height:       CBC Latest Ref Rng & Units 01/22/2021 01/19/2021 01/16/2021  WBC 4.0 - 10.5 K/uL 11.2(H) 10.1 10.4  Hemoglobin 12.0 - 15.0 g/dL 12.3 11.9(L) 12.0  Hematocrit 36.0 - 46.0 % 36.1 36.2 36.2  Platelets 150 - 400 K/uL 253 257 266      Physical Exam: General: AAO, NAD Cardiovascular: normal rate Respiratory: normal effort Abdomen: gravid uterus c/w GA, non tender Extremities: no evidence of DVT   Fetal Monitoring: NST: reactive this morning baseline 120 bpm mod var +accels 15x15, no decels Toco: acontractile   [name]: breech, EFW 3#2 (14%)  A/P: MSPENCER SCHIFANO89y.o. G1P0 at 362w4dD#56 admitted with PPROM on 6/21, now clinically stable without signs of infection or PTL  PPROM -S/p Latency Antibiotics -S/p BMZ 6/21-6/22 -S/p MFM and NICU consults -Afebrile with WBC today of 11.2, Cont to monitor temps -GBS POS, would resume PCN w/ labor if vaginal delivery, but unlikely given breech Fetal Monitoring - Con fetal monitoring with NST q shift- reactive this morning -Weekly [place] -Last Growth 8/9 EFW 14% History Spinal Hemangioma -S/p Anesthesia consultation, ok for spinal SCD VTE ppx Antepartum care -s/p Tdap at 22w -s/p negative GDM screen 7/4 -scheduled for primary c/s 9/9 unless signs of labor or  infection develop before then   [name]8/15/22 12:47 PM

## 2021-01-24 ENCOUNTER — Inpatient Hospital Stay (HOSPITAL_BASED_OUTPATIENT_CLINIC_OR_DEPARTMENT_OTHER): Payer: 59

## 2021-01-24 DIAGNOSIS — Z3A31 31 weeks gestation of pregnancy: Secondary | ICD-10-CM

## 2021-01-24 DIAGNOSIS — O321XX0 Maternal care for breech presentation, not applicable or unspecified: Secondary | ICD-10-CM

## 2021-01-24 DIAGNOSIS — O42913 Preterm premature rupture of membranes, unspecified as to length of time between rupture and onset of labor, third trimester: Secondary | ICD-10-CM

## 2021-01-24 DIAGNOSIS — O43192 Other malformation of placenta, second trimester: Secondary | ICD-10-CM

## 2021-01-24 NOTE — Progress Notes (Signed)
Mikayla Hicks 35 y.o. G1P0 at [place] , Breech HD#57 admitted with PPROM on 6/21  S: Feels a bit stomach upset and nauseated when baby moves but denies pain/ contractions/ change in vag fluid or vag odor/ or vag bleeding. Denies fever/ chills.  + FMs.   O: Vitals:   01/23/21 0814 01/23/21 1612 01/23/21 2003 01/23/21 2337  BP: 115/71 (!) 104/53 137/73 109/61  Pulse: 88 83 95 82  Resp: '16 18 16 18  '$ Temp: 98.2 F (36.8 C) 98.5 F (36.9 C) 98.4 F (36.9 C) 98 F (36.7 C)  TempSrc: Oral Oral Oral Oral  SpO2: 99% 99% 100% 100%  Weight:      Height:       CBC Latest Ref Rng & Units 01/22/2021 01/19/2021 01/16/2021  WBC 4.0 - 10.5 K/uL 11.2(H) 10.1 10.4  Hemoglobin 12.0 - 15.0 g/dL 12.3 11.9(L) 12.0  Hematocrit 36.0 - 46.0 % 36.1 36.2 36.2  Platelets 150 - 400 K/uL 253 257 266      Physical Exam: General: AAO, NAD Cardiovascular: normal rate Respiratory: normal effort Abdomen: gravid uterus c/w GA, non tender Extremities: no evidence of DVT   Fetal Monitoring: NST: reactive this morning baseline 130-140 bpm mod var +accels 15x15, no decels Toco: acontractile   [place] 8/9: breech, EFW 3#2 (14%) BPP/ AFI pending appointment    A/P: Mikayla Hicks 35 y.o. G1P0 at [place], admission for PPROM on 6/21, clinically stable without signs of infection or PTL  PPROM -S/p Latency Antibiotics -S/p BMZ 6/21-6/22 -S/p MFM and NICU consults -Afebrile, cont to monitor temps -GBS POS, would resume PCN w/ labor if vaginal delivery, but unlikely given breech Fetal Monitoring - Con fetal monitoring with NST q shift- reactive this morning -Weekly [place] BPP -Last Growth 8/9 EFW 14% History Spinal Hemangioma -S/p Anesthesia consultation, ok for spinal SCD VTE ppx Antepartum care -s/p Tdap at 22w -s/p negative GDM screen 7/4 -scheduled for primary c/s 9/9 unless signs of labor or infection develop before then   [name] 01/24/21 8:40 AM

## 2021-01-25 LAB — TYPE AND SCREEN
ABO/RH(D): A POS
Antibody Screen: NEGATIVE

## 2021-01-25 LAB — CBC WITH DIFFERENTIAL/PLATELET
Abs Immature Granulocytes: 0.12 10*3/uL — ABNORMAL HIGH (ref 0.00–0.07)
Basophils Absolute: 0 10*3/uL (ref 0.0–0.1)
Basophils Relative: 0 %
Eosinophils Absolute: 0.1 10*3/uL (ref 0.0–0.5)
Eosinophils Relative: 1 %
HCT: 36 % (ref 36.0–46.0)
Hemoglobin: 11.9 g/dL — ABNORMAL LOW (ref 12.0–15.0)
Immature Granulocytes: 1 %
Lymphocytes Relative: 18 %
Lymphs Abs: 1.7 10*3/uL (ref 0.7–4.0)
MCH: 28 pg (ref 26.0–34.0)
MCHC: 33.1 g/dL (ref 30.0–36.0)
MCV: 84.7 fL (ref 80.0–100.0)
Monocytes Absolute: 0.6 10*3/uL (ref 0.1–1.0)
Monocytes Relative: 7 %
Neutro Abs: 6.9 10*3/uL (ref 1.7–7.7)
Neutrophils Relative %: 73 %
Platelets: 231 10*3/uL (ref 150–400)
RBC: 4.25 MIL/uL (ref 3.87–5.11)
RDW: 14.2 % (ref 11.5–15.5)
WBC: 9.4 10*3/uL (ref 4.0–10.5)
nRBC: 0 % (ref 0.0–0.2)

## 2021-01-25 NOTE — Progress Notes (Signed)
HD 58 PPROM 6/21; 31 6/7wks  Breech  No complaints. Good FM. No contractions noted. Slight fluid leak, still clear. NO fever/ chills No abd pain or tenderness reported.  Patient Vitals for the past 24 hrs:  BP Temp Temp src Pulse Resp SpO2  01/25/21 0013 (!) 111/55 98.2 F (36.8 C) Oral 70 16 100 %  01/24/21 2008 129/71 97.9 F (36.6 C) Oral 80 18 100 %  01/24/21 1613 116/63 -- -- 78 -- --    NCAT Neck: supple with FROM Lungs: CTA CV; RRR Abd: soft,nt, gravid soft uterus  LE: no edema, nt bilaterally, no cords Neuro: non focal Skin: intact  FHT: 130s with moderate variability, +accels, rare non recurrent variable decels TOCO: no ctx Category 1 tracing x 3.   A/P: G1P0 at [redacted]w[redacted]d admitted with PPROM on 6/21,  clinically stable without signs of infection or PTL    PPROM -S/p Latency Antibiotics -S/p BMZ 6/21-6/22 -S/p MFM and NICU consults -Afebrile , no s/s chorio -GBS POS, would resume PCN w/ labor if vaginal delivery, but unlikely given breech  Fetal Monitoring- Breech - Con fetal monitoring with NST q shift- reactive this morning -Weekly [place] and position check- prelimary report today c/w minimal fluid, AGA and breech.  History Spinal Hemangioma -S/p Anesthesia consultation, ok for spinal  SCD VTE ppx Antepartum care -s/p Tdap  -s/p negative GDM screen 7/4 -continue inpatient mgmt with goal delivery at 35w

## 2021-01-26 NOTE — Progress Notes (Signed)
HD 59 PPROM 6/21; 32 wks  Breech  Low abdominal pressure after walking a bit yesterday, no pain.  Good FM. No contractions noted. Slight fluid leak, still clear. NO fever/ chills No abd pain or tenderness reported.  Patient Vitals for the past 24 hrs:  BP Temp Temp src Pulse Resp SpO2  01/26/21 0831 110/66 98.6 F (37 C) Oral 90 18 98 %  01/25/21 2249 116/68 98.2 F (36.8 C) Oral 78 16 98 %  01/25/21 1930 138/81 98 F (36.7 C) Oral 93 18 99 %  01/25/21 1555 110/64 98.5 F (36.9 C) Oral 87 16 98 %  01/25/21 1137 137/79 98.4 F (36.9 C) Oral 79 16 100 %    NAD Abd: soft,nt, gravid soft uterus  LE: no edema, nt bilaterally, no cords Neuro: non focal  FHT: 130s with moderate variability, +accels, rare non recurrent variable decels TOCO: no ctx Category 1 tracing x 3.  Growth sono 8/9 - breech, EFW 3#2 (14%)  AC wnl BPP 8/18- 8/10  (-2 for AFI)  A/P: G1P0 at 32 wks  admitted with PPROM on 6/21,  clinically stable without signs of infection or PTL    PPROM -S/p Latency Antibiotics -S/p BMZ 6/21-6/22 -S/p MFM and NICU consults -Afebrile , no s/s chorio -GBS POS, would resume PCN w/ labor if vaginal delivery, but unlikely given breech - -[facility]  - Circumcision planned, reviewed procedure/ r/b/c   Fetal Monitoring- Breech - Con fetal monitoring with NST q shift- reactive this morning -Weekly [place]- BPP, AFI, position check  History Spinal Hemangioma -S/p Anesthesia consultation, ok for spinal  SCD VTE ppx Antepartum care -s/p Tdap  -s/p negative GDM screen 7/4 -continue inpatient mgmt with goal delivery at [place] MD

## 2021-01-27 ENCOUNTER — Encounter (HOSPITAL_COMMUNITY): Payer: Self-pay | Admitting: Obstetrics & Gynecology

## 2021-01-27 NOTE — Progress Notes (Signed)
Mikayla Hicks 35 y.o. G1P0 at 66w1dHD#60 admitted with PPROM 6/21  S: Patient doing well no complaints today. Did notice some mild lower abdominal discomfort yesterday that resolved with position change, none today, no abdominal tightening. LOF larger volumes but still clear, no odor, and no bleeding. Good FM.   O: Vitals:   01/26/21 1615 01/26/21 1617 01/26/21 1934 01/27/21 0753  BP:  (!) 112/53 126/76 126/75  Pulse:  70 85 83  Resp:  '18 18 18  '$ Temp:  98.2 F (36.8 C) 98.8 F (37.1 C) 98.5 F (36.9 C)  TempSrc:  Oral Oral Oral  SpO2: 99% 98% 98% 98%  Weight:      Height:       Physical Exam: General: AAO, NAD Cardiovascular: normal rate Respiratory: normal effort Abdomen: gravid uterus c/w GA, non tender Extremities: no evidence of DVT   Fetal Monitoring: NST: reactive this morning baseline 130 bpm mod var +accels 15x15, no decels Toco: acontractile   [name] Growth: breech, EFW 3#2 (14%) MCI 8/16 BPP 6/8 (2 off fluid) AFI 1.9 cm, breech  A/P: Mikayla GARDINER89y.o. G1P0 at 359w1dD#60 admitted with PPROM on 6/21, now clinically stable without signs of PTL or chorioamnionitis  PPROM -S/p Latency Antibiotics -S/p BMZ 6/21-6/22 -S/p MFM and NICU consults -Afebrile with WBC 8/17 of 9.4, Cont to monitor temps -GBS POS, would resume PCN w/ labor if vaginal delivery, but unlikely given breech Fetal Monitoring - Con fetal monitoring with NST q shift- reactive this morning -Weekly [place] -Last Growth 8/9 EFW 14% History Spinal Hemangioma -S/p Anesthesia consultation, ok for spinal SCD VTE ppx Antepartum care -s/p Tdap at 22w -s/p negative GDM screen 7/4 -scheduled for primary c/s 9/8 unless signs of labor or infection develop before then. Patient consented today for cesarean section for breech presentation. We reviewed risks of cesarean including but not limited to bleeding, infection, damage to surrounding organs such as bowel and bladder, and future pregnancy  implications. She is agreeable to blood transfusion if indicated. All questions answered and consents signed and placed in patient chart.   Mikayla Hicks 01/27/21 1:04 PM

## 2021-01-28 LAB — CBC WITH DIFFERENTIAL/PLATELET
Abs Immature Granulocytes: 0.17 10*3/uL — ABNORMAL HIGH (ref 0.00–0.07)
Basophils Absolute: 0 10*3/uL (ref 0.0–0.1)
Basophils Relative: 0 %
Eosinophils Absolute: 0.1 10*3/uL (ref 0.0–0.5)
Eosinophils Relative: 1 %
HCT: 35.4 % — ABNORMAL LOW (ref 36.0–46.0)
Hemoglobin: 11.7 g/dL — ABNORMAL LOW (ref 12.0–15.0)
Immature Granulocytes: 2 %
Lymphocytes Relative: 19 %
Lymphs Abs: 1.9 10*3/uL (ref 0.7–4.0)
MCH: 27.9 pg (ref 26.0–34.0)
MCHC: 33.1 g/dL (ref 30.0–36.0)
MCV: 84.5 fL (ref 80.0–100.0)
Monocytes Absolute: 0.7 10*3/uL (ref 0.1–1.0)
Monocytes Relative: 7 %
Neutro Abs: 7.1 10*3/uL (ref 1.7–7.7)
Neutrophils Relative %: 71 %
Platelets: 223 10*3/uL (ref 150–400)
RBC: 4.19 MIL/uL (ref 3.87–5.11)
RDW: 14.3 % (ref 11.5–15.5)
WBC: 10 10*3/uL (ref 4.0–10.5)
nRBC: 0 % (ref 0.0–0.2)

## 2021-01-28 LAB — TYPE AND SCREEN
ABO/RH(D): A POS
Antibody Screen: NEGATIVE

## 2021-01-28 NOTE — Progress Notes (Signed)
Mikayla Hicks 35 y.o. G1P0 at 108w2dHD#61 admitted with PPROM 6/21  S: Patient doing well no complaints today.  Some pelvic pressure/fullness but no contractions.  Scant LOF  still clear, no odor, and no bleeding. Good FM.   O: Vitals:   01/28/21 0920 01/28/21 1222 01/28/21 1407 01/28/21 1506  BP:  126/72  123/74  Pulse: 72 80  90  Resp:  16  16  Temp:  98.6 F (37 C)  98.5 F (36.9 C)  TempSrc:  Oral  Oral  SpO2:  97%  99%  Weight:   107.5 kg   Height:       Physical Exam: General: AAO, NAD Cardiovascular: normal rate Respiratory: normal effort Abdomen: gravid uterus c/w GA, non tender Extremities: no evidence of DVT   Fetal Monitoring: NST: reactive this morning baseline 130 bpm mod var +accels 15x15, no decels Toco: acontractile   [name] Growth: breech, EFW 3#2 (14%) MCI 8/16 BPP 6/8 (2 off fluid) AFI 1.9 cm, breech  A/P: Mikayla GILIBERTO89y.o. G1P0 at 392w2dD#61 admitted with PPROM on 6/21, now clinically stable without signs of PTL or chorioamnionitis  PPROM -S/p Latency Antibiotics -S/p BMZ 6/21-6/22 -S/p MFM and NICU consults -Afebrile with WBC 8/17 of 9.4, Cont to monitor temps -GBS POS, would resume PCN w/ labor if vaginal delivery, but unlikely given breech Fetal Monitoring - Con fetal monitoring with NST q shift- reactive this morning -Weekly [place] -Last Growth 8/9 EFW 14% History Spinal Hemangioma -S/p Anesthesia consultation, ok for spinal SCD VTE ppx Antepartum care -s/p Tdap at 22w -s/p negative GDM screen 7/4 -scheduled for primary c/s 9/8 unless signs of labor or infection develop before then. Patient consented for cesarean section for breech presentation.   Mikayla Dach8/20/22 3:47 PM

## 2021-01-29 NOTE — Progress Notes (Signed)
Mikayla Hicks 35 y.o. G1P0 at 66w3dHD#62 admitted with PPROM 6/21  S: Patient doing well.  Patient notes increase in leakage of fluid last night and again today.  New odor but not malodor.  No fishy smell.  No thickness or cloudiness to fluid.  Patient had one streak of blood with wiping today.  No new foods or medicines. Some pelvic pressure/fullness but no contractions.  Good FM.  No fevers.  No abdominal pain  O: Vitals:   01/28/21 1506 01/28/21 1937 01/28/21 2230 01/29/21 0852  BP: 123/74 133/72 113/66 130/75  Pulse: 90 (!) 111 74 82  Resp: '16 15 16 16  '$ Temp: 98.5 F (36.9 C) 98.5 F (36.9 C) 98.5 F (36.9 C) 98.5 F (36.9 C)  TempSrc: Oral Oral Oral Oral  SpO2: 99% 99% 99% 100%  Weight:      Height:       Physical Exam: General: AAO, NAD Cardiovascular: normal rate Respiratory: normal effort Abdomen: gravid uterus c/w GA, non tender.  Patient notes leaking fluid with fundal pressure Extremities: no evidence of DVT   Fetal Monitoring: NST: reactive this morning baseline 125s bpm mod var +accels 15x15, no decels Toco: acontractile   [name] Growth: breech, EFW 3#2 (14%) MCI 8/16 BPP 6/8 (2 off fluid) AFI 1.9 cm, breech  CBC Latest Ref Rng & Units 01/28/2021 01/25/2021 01/22/2021  WBC 4.0 - 10.5 K/uL 10.0 9.4 11.2(H)  Hemoglobin 12.0 - 15.0 g/dL 11.7(L) 11.9(L) 12.3  Hematocrit 36.0 - 46.0 % 35.4(L) 36.0 36.1  Platelets 150 - 400 K/uL 223 231 253    A/P: MJANEAN PEDRAZA89y.o. G1P0 at 337w3dD#62 admitted with PPROM on 6/21, now clinically stable without signs of PTL or chorioamnionitis  PPROM -S/p Latency Antibiotics -S/p BMZ 6/21-6/22 -S/p MFM and NICU consults -Afebrile with WBC 8/17 of 9.4, Cont to monitor temps -GBS POS, would resume PCN w/ labor if vaginal delivery, but unlikely given breech Fetal Monitoring - Con fetal monitoring with NST q shift- reactive this morning -Weekly [place] -Last Growth 8/9 EFW 14% History Spinal Hemangioma -S/p Anesthesia  consultation, ok for spinal SCD VTE ppx Antepartum care -s/p Tdap at 22w -s/p negative GDM screen 7/4 -scheduled for primary c/s 9/8 unless signs of labor or infection develop before then. Patient consented for cesarean section for breech presentation.   [name]8/21/22 11:49 AM

## 2021-01-30 NOTE — Progress Notes (Signed)
Pt sleeping. Overnight NST reactive. Not on monitor yet this morning per RN. Will return later for rounds

## 2021-01-30 NOTE — Progress Notes (Addendum)
HD #63 Pprom 6/21  Feels leaking more fluid last few days, no bleeding; no ctx; +FM Odor change last couple days but not foul odor Got belly band to help with occasional pelvic pressure   Patient Vitals for the past 24 hrs:  BP Temp Temp src Pulse Resp SpO2  01/30/21 1524 125/76 98.2 F (36.8 C) Oral 76 18 99 %  01/30/21 1146 115/79 98.5 F (36.9 C) Oral 86 18 99 %  01/30/21 0836 126/83 98.1 F (36.7 C) Oral 91 17 99 %  01/29/21 2238 129/69 97.9 F (36.6 C) Oral 80 17 99 %  01/29/21 1918 121/86 98.5 F (36.9 C) Oral 88 16 100 %  01/29/21 1713 128/69 98.5 F (36.9 C) Oral 96 16 100 %   A&ox3 Nml respirations Abd: soft, nt, gravid LE: ne oedema, nt biltal  FHT: 130s, nml variability, +accels, no decels; occ variabile TOCO: no ctx  [place] 8/9 Growth: breech, EFW 3#2 (14%) MCI 8/16 BPP 6/8 (2 off fluid) AFI 1.9 cm, breech  CBC Latest Ref Rng & Units 01/28/2021 01/25/2021 01/22/2021  WBC 4.0 - 10.5 K/uL 10.0 9.4 11.2(H)  Hemoglobin 12.0 - 15.0 g/dL 11.7(L) 11.9(L) 12.3  Hematocrit 36.0 - 46.0 % 35.4(L) 36.0 36.1  Platelets 150 - 400 K/uL 223 231 253   A/P:  35 y.o. G1P0 at 10w4dHD#63 admitted with PPROM on 6/21, now clinically stable without signs of PTL or chorioamnionitis   PPROM -S/p Latency Antibiotics -S/p BMZ 6/21-6/22 -S/p MFM and NICU consults -Afebrile with WBC 8/20 of 10.0, Cont to monitor temps -GBS POS, would resume PCN w/ labor if vaginal delivery, but unlikely given breech Fetal Monitoring - Con fetal monitoring with NST q shift- reactive this morning -Weekly [place]PP -Last Growth 8/9 EFW 14% History Spinal Hemangioma -S/p Anesthesia consultation, ok for spinal SCD VTE ppx Antepartum care -s/p Tdap at 22w -s/p negative GDM screen 7/4 -scheduled for primary c/s 9/8 unless signs of labor or infection develop before then. Patient consented for cesarean section for breech presentation.

## 2021-01-30 NOTE — Progress Notes (Signed)
CSW followed up with patient at bedside. CSW and patient discussed patient's week, supports and recently spending time with family. Patient updated CSW on recent family beach trip. Patient and CSW discussed delivery and returning home post discharge. CSW praised patient's efforts in regards to hospitalization and provided encouraging words for continued journey. Patient presented calm, pleasant and remained engaged during conversation. CSW inquired about any needs/concerns, patient reported none. CSW agreed to follow up with patient next week. Patient expressed gratitude and thanked CSW for visit.    Mikayla Hicks, [place] Worker [facility]#: [phone]

## 2021-01-31 ENCOUNTER — Inpatient Hospital Stay (HOSPITAL_BASED_OUTPATIENT_CLINIC_OR_DEPARTMENT_OTHER): Payer: 59

## 2021-01-31 DIAGNOSIS — O43192 Other malformation of placenta, second trimester: Secondary | ICD-10-CM

## 2021-01-31 DIAGNOSIS — O42913 Preterm premature rupture of membranes, unspecified as to length of time between rupture and onset of labor, third trimester: Secondary | ICD-10-CM

## 2021-01-31 DIAGNOSIS — O321XX0 Maternal care for breech presentation, not applicable or unspecified: Secondary | ICD-10-CM

## 2021-01-31 DIAGNOSIS — Z3A32 32 weeks gestation of pregnancy: Secondary | ICD-10-CM

## 2021-01-31 LAB — CBC WITH DIFFERENTIAL/PLATELET
Abs Immature Granulocytes: 0.23 10*3/uL — ABNORMAL HIGH (ref 0.00–0.07)
Basophils Absolute: 0.1 10*3/uL (ref 0.0–0.1)
Basophils Relative: 1 %
Eosinophils Absolute: 0.1 10*3/uL (ref 0.0–0.5)
Eosinophils Relative: 1 %
HCT: 37.3 % (ref 36.0–46.0)
Hemoglobin: 12.4 g/dL (ref 12.0–15.0)
Immature Granulocytes: 2 %
Lymphocytes Relative: 19 %
Lymphs Abs: 2.1 10*3/uL (ref 0.7–4.0)
MCH: 28.1 pg (ref 26.0–34.0)
MCHC: 33.2 g/dL (ref 30.0–36.0)
MCV: 84.6 fL (ref 80.0–100.0)
Monocytes Absolute: 0.7 10*3/uL (ref 0.1–1.0)
Monocytes Relative: 7 %
Neutro Abs: 7.6 10*3/uL (ref 1.7–7.7)
Neutrophils Relative %: 70 %
Platelets: 247 10*3/uL (ref 150–400)
RBC: 4.41 MIL/uL (ref 3.87–5.11)
RDW: 14.2 % (ref 11.5–15.5)
WBC: 10.8 10*3/uL — ABNORMAL HIGH (ref 4.0–10.5)
nRBC: 0 % (ref 0.0–0.2)

## 2021-01-31 LAB — TYPE AND SCREEN
ABO/RH(D): A POS
Antibody Screen: NEGATIVE

## 2021-01-31 NOTE — Progress Notes (Addendum)
HD 66 PPROM 6/21 IUP at 32.5wga   Leaking, though less over last day; denies foul odor, no vb,ctx +fm Feeling good  Patient Vitals for the past 24 hrs:  BP Temp Temp src Pulse Resp SpO2  01/31/21 1000 117/62 98.7 F (37.1 C) Oral 87 18 99 %  01/30/21 2328 (!) 104/56 97.9 F (36.6 C) Oral 72 18 100 %  01/30/21 2326 94/64 -- -- 80 -- --  01/30/21 1930 123/82 98.3 F (36.8 C) Oral 89 18 97 %  01/30/21 1524 125/76 98.2 F (36.8 C) Oral 76 18 99 %   A&ox3 Nml respirations Abd; soft, nt, gravid LE: no edema, nt bilat  FHT: 130s, nml variability, +accels, no decels, rare variable TOCO: none  CBC Latest Ref Rng & Units 01/31/2021 01/28/2021 01/25/2021  WBC 4.0 - 10.5 K/uL 10.8(H) 10.0 9.4  Hemoglobin 12.0 - 15.0 g/dL 12.4 11.7(L) 11.9(L)  Hematocrit 36.0 - 46.0 % 37.3 35.4(L) 36.0  Platelets 150 - 400 K/uL 247 223 231   [place] 8/9 Growth: breech, EFW 3#2 (14%) MCI 8/16 BPP 6/8 (2 off fluid) AFI 1.9 cm, breech Bpp today pending   A/P:  35 y.o. G1P0 at 60w4dHD#64 admitted with PPROM on 6/21, clinically stable without signs of PTL or chorioamnionitis   PPROM -S/p Latency Antibiotics -S/p BMZ 6/21-6/22 -S/p MFM and NICU consults -Afebrile with WBC 10.8 today, Cont to monitor temps -GBS POS, would resume PCN w/ labor if vaginal delivery, but unlikely given breech Fetal Monitoring - Con fetal monitoring with NST q shift- reactive last 24 hrs -Weekly [place]PP, not yet done today -Last Growth 8/9 EFW 14% History Spinal Hemangioma -S/p Anesthesia consultation, ok for spinal SCD VTE ppx Antepartum care -s/p Tdap at 22w -s/p negative GDM screen 7/4 -scheduled for primary c/s 9/8 unless signs of labor or infection develop before then. Patient consented for cesarean section for breech presentation.

## 2021-02-01 NOTE — Progress Notes (Signed)
Mikayla Hicks 35 y.o. G1P0 at 52w6dHD#65 admitted with PPROM on 6/21  S: Doing well, not much sleep last night but overall good. Does notice some discomfort with fetal movements- states not painful just different. No changes in vaginal fluid color, no bleeding. No fevers. No nausea.  O: Vitals:   01/31/21 1559 01/31/21 1947 01/31/21 2359 02/01/21 0854  BP: 131/83 139/74 111/76 (!) 107/55  Pulse: 91 82 91 83  Resp: '18 18 18 17  '$ Temp: 98.2 F (36.8 C) 98.3 F (36.8 C)  98.2 F (36.8 C)  TempSrc: Oral Oral  Oral  SpO2: 100% 100%  100%  Weight:      Height:       Physical Exam: General: AAO, NAD Cardiovascular: normal rate Respiratory: normal effort Abdomen: gravid uterus c/w GA, non tender to palpation Extremities: no evidence of DVT   Fetal Monitoring: NST: reactive this morning baseline 125bpm mod var +accels 15x15, no decels Toco: acontractile   [name] Growth: breech, EFW 3#2 (14%) MCI 8/23 BPP 6/8 (2 off fluid) AFI 2.42 cm, breech  A/P: [name]89y.o. G1P0 at 362w6dD#64 admitted with PPROM on 6/21, now clinically stable with no signs of PTL or chorioamnionitis   PPROM -S/p Latency Antibiotics -S/p BMZ 6/21-6/22 -S/p MFM and NICU consults -Afebrile with WBC 10.8 on 8/23 Cont to monitor temps -GBS POS, would resume PCN w/ labor if vaginal delivery, but unlikely given breech Fetal Monitoring - Con fetal monitoring with NST q shift- reactive this morning -Weekly [place]PP -Last Growth 8/9 EFW 14% History Spinal Hemangioma -S/p Anesthesia consultation, ok for spinal SCD VTE ppx Antepartum care -s/p Tdap at 22w -s/p negative GDM screen 7/4 -scheduled for primary c/s 9/9 unless signs of labor or infection develop before then.   [name] A [name] 02/01/21 12:56 PM

## 2021-02-02 NOTE — Progress Notes (Signed)
Mikayla Hicks 35 y.o. G1P0 at 26w0dHD#66 admitted with PPROM on 6/21  S: Doing well, no contractions and active fetal movement.  No abdominal pain or fevers.  No vaginal bleeding but patient does note scant thin brown discharge with wiping yesterday.  None today.  No odor to her fluid.  Patient states scant leaking of fluid.    O: Vitals:   02/01/21 1939 02/01/21 2246 02/02/21 0829 02/02/21 1144  BP: 129/80 122/66 135/84 118/67  Pulse: 83 80 92 88  Resp: '18 20 19 19  '$ Temp: 98.1 F (36.7 C) (!) 97.4 F (36.3 C) 99 F (37.2 C) 98.1 F (36.7 C)  TempSrc: Oral Oral Oral Oral  SpO2: 98% 100% 97% 99%  Weight:      Height:       Physical Exam: General: AAO, NAD Cardiovascular: normal rate Respiratory: normal effort Abdomen: gravid uterus c/w GA, non tender to palpation Extremities: no evidence of DVT   Fetal Monitoring: NST: reactive this morning baseline 125bpm mod var +accels 15x15, very occasional variable decelerations  toco: acontractile   [name] Growth: breech, EFW 3#2 (14%) MCI 8/23 BPP 6/8 (2 off fluid) AFI 2.42 cm, breech  A/P: MMAGON BOULTON89y.o. G1P0 at 39w0dD#65 admitted with PPROM on 6/21, now clinically stable with no signs of PTL or chorioamnionitis   PPROM -S/p Latency Antibiotics -S/p BMZ 6/21-6/22 -S/p MFM and NICU consults -Afebrile with WBC 10.8 on 8/23 Cont to monitor temps -GBS POS, would resume PCN w/ labor if vaginal delivery, but unlikely given breech Fetal Monitoring - Con fetal monitoring with NST q shift- reactive this morning -Weekly [place]PP -Last Growth 8/9 EFW 14% History Spinal Hemangioma -S/p Anesthesia consultation, ok for spinal SCD VTE ppx Antepartum care -s/p Tdap at 22w -s/p negative GDM screen 7/4 -scheduled for primary c/s 9/9 unless signs of labor or infection develop before then.   [name]8/25/22 12:04 PM

## 2021-02-02 NOTE — Progress Notes (Signed)
RN called pt feeling lower abdo cramping like period cramps and some brown specs in vag fluid. Aldo noted a spell of dizziness while sitting with her father in the atrium area. No fever/ chills/ back pain or vag bleeding  BP 131/75   Pulse 95   Temp 98.1 F (36.7 C) (Oral)   Resp 19   Ht '5\' 11"'$  (1.803 m)   Wt 107.5 kg   LMP 06/18/2020   SpO2 100%   BMI 33.06 kg/m   FHT 120s + accels no decels mod variability Toco- no UCs over 1 hr monitoring  Abdomen unclear if tender or sore with pressure. "I feel that but not causing pain" per pt Speculum exam- cx still closed, long, mostly clear fluid with 1 dot like tan spot   Possible early labor/ possible early chorioamnionitis/ possible just mild contractions  Warning s/s reviewed, will reassess in few hours

## 2021-02-03 ENCOUNTER — Inpatient Hospital Stay (HOSPITAL_COMMUNITY): Payer: 59 | Admitting: Anesthesiology

## 2021-02-03 ENCOUNTER — Encounter (HOSPITAL_COMMUNITY): Payer: Self-pay | Admitting: Obstetrics & Gynecology

## 2021-02-03 ENCOUNTER — Encounter (HOSPITAL_COMMUNITY)
Admission: AD | Disposition: A | Discharge status: Home / Self Care | Payer: Self-pay | Source: Home / Self Care | Attending: Obstetrics & Gynecology

## 2021-02-03 DIAGNOSIS — O99892 Other specified diseases and conditions complicating childbirth: Secondary | ICD-10-CM | POA: Diagnosis present

## 2021-02-03 LAB — CBC WITH DIFFERENTIAL/PLATELET
Abs Immature Granulocytes: 0.2 10*3/uL — ABNORMAL HIGH (ref 0.00–0.07)
Basophils Absolute: 0.1 10*3/uL (ref 0.0–0.1)
Basophils Relative: 0 %
Eosinophils Absolute: 0.1 10*3/uL (ref 0.0–0.5)
Eosinophils Relative: 1 %
HCT: 39.3 % (ref 36.0–46.0)
Hemoglobin: 12.9 g/dL (ref 12.0–15.0)
Immature Granulocytes: 2 %
Lymphocytes Relative: 15 %
Lymphs Abs: 1.7 10*3/uL (ref 0.7–4.0)
MCH: 27.9 pg (ref 26.0–34.0)
MCHC: 32.8 g/dL (ref 30.0–36.0)
MCV: 85.1 fL (ref 80.0–100.0)
Monocytes Absolute: 0.7 10*3/uL (ref 0.1–1.0)
Monocytes Relative: 7 %
Neutro Abs: 8.7 10*3/uL — ABNORMAL HIGH (ref 1.7–7.7)
Neutrophils Relative %: 75 %
Platelets: 250 10*3/uL (ref 150–400)
RBC: 4.62 MIL/uL (ref 3.87–5.11)
RDW: 14.2 % (ref 11.5–15.5)
WBC: 11.4 10*3/uL — ABNORMAL HIGH (ref 4.0–10.5)
nRBC: 0 % (ref 0.0–0.2)

## 2021-02-03 LAB — TYPE AND SCREEN
ABO/RH(D): A POS
Antibody Screen: NEGATIVE

## 2021-02-03 SURGERY — Surgical Case
Anesthesia: Spinal

## 2021-02-03 MED ORDER — NALBUPHINE HCL 10 MG/ML IJ SOLN: 5.0000 mg | Freq: Once | INTRAMUSCULAR | Status: DC | PRN

## 2021-02-03 MED ORDER — LORATADINE 10 MG PO TABS
10.0000 mg | ORAL_TABLET | Freq: Every day | ORAL | Status: DC
  Filled 2021-02-03: qty 1

## 2021-02-03 MED ORDER — DIPHENHYDRAMINE HCL 25 MG PO CAPS: 25.0000 mg | ORAL_CAPSULE | ORAL | Status: DC | PRN

## 2021-02-03 MED ORDER — SODIUM CHLORIDE 0.9 % IV SOLN
INTRAVENOUS | Status: AC
  Filled 2021-02-03 (×2): qty 500

## 2021-02-03 MED ORDER — DEXAMETHASONE SODIUM PHOSPHATE 4 MG/ML IJ SOLN
INTRAMUSCULAR | Status: AC
  Filled 2021-02-03: qty 1

## 2021-02-03 MED ORDER — ZOLPIDEM TARTRATE 5 MG PO TABS: 5.0000 mg | ORAL_TABLET | Freq: Every evening | ORAL | Status: DC | PRN

## 2021-02-03 MED ORDER — IBUPROFEN 600 MG PO TABS
600.0000 mg | ORAL_TABLET | Freq: Four times a day (QID) | ORAL | Status: DC
  Administered 2021-02-04 – 2021-02-05 (×4): 600 mg via ORAL
  Filled 2021-02-03 (×4): qty 1

## 2021-02-03 MED ORDER — COCONUT OIL OIL: 1.0000 "application " | TOPICAL_OIL | Status: DC | PRN

## 2021-02-03 MED ORDER — ONDANSETRON HCL 4 MG/2ML IJ SOLN
INTRAMUSCULAR | Status: DC | PRN
  Administered 2021-02-03: 4 mg via INTRAVENOUS

## 2021-02-03 MED ORDER — SODIUM CHLORIDE 0.9 % IV SOLN
INTRAVENOUS | Status: DC | PRN
  Administered 2021-02-03: 500 mg via INTRAVENOUS

## 2021-02-03 MED ORDER — DIPHENHYDRAMINE HCL 50 MG/ML IJ SOLN: 12.5000 mg | INTRAMUSCULAR | Status: DC | PRN

## 2021-02-03 MED ORDER — NALOXONE HCL 4 MG/10ML IJ SOLN
1.0000 ug/kg/h | INTRAVENOUS | Status: DC | PRN
  Filled 2021-02-03: qty 5

## 2021-02-03 MED ORDER — OXYCODONE HCL 5 MG PO TABS: 5.0000 mg | ORAL_TABLET | Freq: Once | ORAL | Status: DC | PRN

## 2021-02-03 MED ORDER — NALBUPHINE HCL 10 MG/ML IJ SOLN: 5.0000 mg | INTRAMUSCULAR | Status: DC | PRN

## 2021-02-03 MED ORDER — SCOPOLAMINE 1 MG/3DAYS TD PT72
1.0000 | MEDICATED_PATCH | Freq: Once | TRANSDERMAL | Status: DC
  Administered 2021-02-03: 1.5 mg via TRANSDERMAL

## 2021-02-03 MED ORDER — KETOROLAC TROMETHAMINE 30 MG/ML IJ SOLN
INTRAMUSCULAR | Status: AC
  Filled 2021-02-03: qty 1

## 2021-02-03 MED ORDER — MORPHINE SULFATE (PF) 0.5 MG/ML IJ SOLN
INTRAMUSCULAR | Status: AC
  Filled 2021-02-03: qty 10

## 2021-02-03 MED ORDER — SODIUM CHLORIDE 0.9 % IR SOLN
Status: DC | PRN
  Administered 2021-02-03: 1

## 2021-02-03 MED ORDER — PHENYLEPHRINE HCL-NACL 20-0.9 MG/250ML-% IV SOLN
INTRAVENOUS | Status: DC | PRN
  Administered 2021-02-03: 60 ug/min via INTRAVENOUS

## 2021-02-03 MED ORDER — CEFAZOLIN SODIUM-DEXTROSE 2-4 GM/100ML-% IV SOLN
2.0000 g | INTRAVENOUS | Status: AC
  Administered 2021-02-03: 2 g via INTRAVENOUS

## 2021-02-03 MED ORDER — SENNOSIDES-DOCUSATE SODIUM 8.6-50 MG PO TABS
2.0000 | ORAL_TABLET | Freq: Every day | ORAL | Status: DC
  Filled 2021-02-03: qty 2

## 2021-02-03 MED ORDER — SODIUM CHLORIDE 0.9 % IV SOLN
INTRAVENOUS | Status: DC | PRN
  Administered 2021-02-03: 250 mL via INTRAVENOUS

## 2021-02-03 MED ORDER — SODIUM CHLORIDE 0.9% FLUSH: 3.0000 mL | INTRAVENOUS | Status: DC | PRN

## 2021-02-03 MED ORDER — DEXAMETHASONE SODIUM PHOSPHATE 4 MG/ML IJ SOLN
INTRAMUSCULAR | Status: DC | PRN
  Administered 2021-02-03: 4 mg via INTRAVENOUS

## 2021-02-03 MED ORDER — OXYCODONE HCL 5 MG PO TABS
5.0000 mg | ORAL_TABLET | Freq: Four times a day (QID) | ORAL | Status: DC | PRN
  Administered 2021-02-04: 5 mg via ORAL
  Filled 2021-02-03 (×2): qty 1

## 2021-02-03 MED ORDER — DIBUCAINE (PERIANAL) 1 % EX OINT: 1.0000 "application " | TOPICAL_OINTMENT | CUTANEOUS | Status: DC | PRN

## 2021-02-03 MED ORDER — SODIUM CHLORIDE 0.9 % IV SOLN
2.0000 g | Freq: Four times a day (QID) | INTRAVENOUS | Status: AC
  Administered 2021-02-03 – 2021-02-04 (×4): 2 g via INTRAVENOUS
  Filled 2021-02-03 (×4): qty 2

## 2021-02-03 MED ORDER — NALBUPHINE HCL 10 MG/ML IJ SOLN
5.0000 mg | INTRAMUSCULAR | Status: DC | PRN
Start: 2021-02-03 — End: 2021-02-05

## 2021-02-03 MED ORDER — FENTANYL CITRATE (PF) 100 MCG/2ML IJ SOLN
INTRAMUSCULAR | Status: AC
  Filled 2021-02-03: qty 2

## 2021-02-03 MED ORDER — WITCH HAZEL-GLYCERIN EX PADS: 1.0000 "application " | MEDICATED_PAD | CUTANEOUS | Status: DC | PRN

## 2021-02-03 MED ORDER — FENTANYL CITRATE (PF) 100 MCG/2ML IJ SOLN
INTRAMUSCULAR | Status: DC | PRN
  Administered 2021-02-03: 15 ug via INTRATHECAL

## 2021-02-03 MED ORDER — NALOXONE HCL 0.4 MG/ML IJ SOLN: 0.4000 mg | INTRAMUSCULAR | Status: DC | PRN

## 2021-02-03 MED ORDER — PROMETHAZINE HCL 25 MG/ML IJ SOLN: 6.2500 mg | INTRAMUSCULAR | Status: DC | PRN

## 2021-02-03 MED ORDER — BUPIVACAINE IN DEXTROSE 0.75-8.25 % IT SOLN
INTRATHECAL | Status: DC | PRN
  Administered 2021-02-03: 1.6 mL via INTRATHECAL

## 2021-02-03 MED ORDER — ACETAMINOPHEN 10 MG/ML IV SOLN
INTRAVENOUS | Status: DC | PRN
  Administered 2021-02-03: 1000 mg via INTRAVENOUS

## 2021-02-03 MED ORDER — OXYCODONE HCL 5 MG/5ML PO SOLN: 5.0000 mg | Freq: Once | ORAL | Status: DC | PRN

## 2021-02-03 MED ORDER — ONDANSETRON HCL 4 MG/2ML IJ SOLN: 4.0000 mg | Freq: Three times a day (TID) | INTRAMUSCULAR | Status: DC | PRN

## 2021-02-03 MED ORDER — KETOROLAC TROMETHAMINE 30 MG/ML IJ SOLN: 30.0000 mg | Freq: Four times a day (QID) | INTRAMUSCULAR | Status: DC | PRN

## 2021-02-03 MED ORDER — FENTANYL CITRATE (PF) 100 MCG/2ML IJ SOLN: 25.0000 ug | INTRAMUSCULAR | Status: DC | PRN

## 2021-02-03 MED ORDER — SIMETHICONE 80 MG PO CHEW
80.0000 mg | CHEWABLE_TABLET | Freq: Three times a day (TID) | ORAL | Status: DC
  Administered 2021-02-03 – 2021-02-04 (×4): 80 mg via ORAL
  Filled 2021-02-03 (×5): qty 1

## 2021-02-03 MED ORDER — SOD CITRATE-CITRIC ACID 500-334 MG/5ML PO SOLN
ORAL | Status: AC
  Administered 2021-02-03: 30 mL
  Filled 2021-02-03: qty 30

## 2021-02-03 MED ORDER — STERILE WATER FOR IRRIGATION IR SOLN
Status: DC | PRN
  Administered 2021-02-03: 1

## 2021-02-03 MED ORDER — KETOROLAC TROMETHAMINE 30 MG/ML IJ SOLN
30.0000 mg | Freq: Four times a day (QID) | INTRAMUSCULAR | Status: AC
  Administered 2021-02-03 – 2021-02-04 (×4): 30 mg via INTRAVENOUS
  Filled 2021-02-03 (×4): qty 1

## 2021-02-03 MED ORDER — ACETAMINOPHEN 10 MG/ML IV SOLN
INTRAVENOUS | Status: AC
  Filled 2021-02-03: qty 100

## 2021-02-03 MED ORDER — SIMETHICONE 80 MG PO CHEW: 80.0000 mg | CHEWABLE_TABLET | ORAL | Status: DC | PRN

## 2021-02-03 MED ORDER — ACETAMINOPHEN 325 MG PO TABS: 650.0000 mg | ORAL_TABLET | ORAL | Status: DC | PRN

## 2021-02-03 MED ORDER — OXYTOCIN-SODIUM CHLORIDE 30-0.9 UT/500ML-% IV SOLN: 2.5000 [IU]/h | INTRAVENOUS | Status: AC

## 2021-02-03 MED ORDER — MEPERIDINE HCL 25 MG/ML IJ SOLN: 6.2500 mg | INTRAMUSCULAR | Status: DC | PRN

## 2021-02-03 MED ORDER — OXYTOCIN-SODIUM CHLORIDE 30-0.9 UT/500ML-% IV SOLN
INTRAVENOUS | Status: DC | PRN
  Administered 2021-02-03: 200 mL via INTRAVENOUS

## 2021-02-03 MED ORDER — MENTHOL 3 MG MT LOZG: 1.0000 | LOZENGE | OROMUCOSAL | Status: DC | PRN

## 2021-02-03 MED ORDER — BUSPIRONE HCL 15 MG PO TABS
7.5000 mg | ORAL_TABLET | Freq: Two times a day (BID) | ORAL | Status: DC
  Administered 2021-02-03: 7.5 mg via ORAL
  Filled 2021-02-03 (×5): qty 1

## 2021-02-03 MED ORDER — TETANUS-DIPHTH-ACELL PERTUSSIS 5-2.5-18.5 LF-MCG/0.5 IM SUSY
0.5000 mL | PREFILLED_SYRINGE | Freq: Once | INTRAMUSCULAR | Status: DC
  Filled 2021-02-03: qty 0.5

## 2021-02-03 MED ORDER — PRENATAL MULTIVITAMIN CH
1.0000 | ORAL_TABLET | Freq: Every day | ORAL | Status: DC
  Administered 2021-02-04 – 2021-02-05 (×2): 1 via ORAL
  Filled 2021-02-03 (×2): qty 1

## 2021-02-03 MED ORDER — LACTATED RINGERS IV SOLN: INTRAVENOUS | Status: DC

## 2021-02-03 MED ORDER — DIPHENHYDRAMINE HCL 25 MG PO CAPS: 25.0000 mg | ORAL_CAPSULE | Freq: Four times a day (QID) | ORAL | Status: DC | PRN

## 2021-02-03 MED ORDER — KETOROLAC TROMETHAMINE 30 MG/ML IJ SOLN
30.0000 mg | Freq: Four times a day (QID) | INTRAMUSCULAR | Status: DC | PRN
  Administered 2021-02-03: 30 mg via INTRAVENOUS

## 2021-02-03 MED ORDER — LIDOCAINE HCL 1 % IJ SOLN
INTRAMUSCULAR | Status: AC
  Filled 2021-02-03: qty 20

## 2021-02-03 MED ORDER — POVIDONE-IODINE 10 % EX SWAB: 2.0000 "application " | Freq: Once | CUTANEOUS | Status: DC

## 2021-02-03 MED ORDER — SCOPOLAMINE 1 MG/3DAYS TD PT72
MEDICATED_PATCH | TRANSDERMAL | Status: AC
  Filled 2021-02-03: qty 1

## 2021-02-03 MED ORDER — ONDANSETRON HCL 4 MG/2ML IJ SOLN
INTRAMUSCULAR | Status: AC
  Filled 2021-02-03: qty 2

## 2021-02-03 MED ORDER — MORPHINE SULFATE (PF) 0.5 MG/ML IJ SOLN
INTRAMUSCULAR | Status: DC | PRN
  Administered 2021-02-03: .15 mg via INTRATHECAL

## 2021-02-03 SURGICAL SUPPLY — 32 items
BENZOIN TINCTURE PRP APPL 2/3 (GAUZE/BANDAGES/DRESSINGS) ×2 IMPLANT
CHLORAPREP W/TINT 26ML (MISCELLANEOUS) ×2 IMPLANT
CLAMP CORD UMBIL (MISCELLANEOUS) IMPLANT
CLOTH BEACON ORANGE TIMEOUT ST (SAFETY) ×2 IMPLANT
DRSG OPSITE POSTOP 4X10 (GAUZE/BANDAGES/DRESSINGS) ×2 IMPLANT
ELECT REM PT RETURN 9FT ADLT (ELECTROSURGICAL) ×2
ELECTRODE REM PT RTRN 9FT ADLT (ELECTROSURGICAL) ×1 IMPLANT
EXTRACTOR VACUUM KIWI (MISCELLANEOUS) IMPLANT
GLOVE BIOGEL PI IND STRL 7.0 (GLOVE) ×3 IMPLANT
GLOVE BIOGEL PI INDICATOR 7.0 (GLOVE) ×3
GLOVE ECLIPSE 6.5 STRL STRAW (GLOVE) ×2 IMPLANT
GOWN STRL REUS W/TWL LRG LVL3 (GOWN DISPOSABLE) ×4 IMPLANT
KIT ABG SYR 3ML LUER SLIP (SYRINGE) IMPLANT
LIGASURE IMPACT 36 18CM CVD LR (INSTRUMENTS) ×2 IMPLANT
NEEDLE HYPO 25X5/8 SAFETYGLIDE (NEEDLE) IMPLANT
NS IRRIG 1000ML POUR BTL (IV SOLUTION) ×2 IMPLANT
PACK C SECTION WH (CUSTOM PROCEDURE TRAY) ×2 IMPLANT
PAD OB MATERNITY 4.3X12.25 (PERSONAL CARE ITEMS) ×2 IMPLANT
STRIP CLOSURE SKIN 1/2X4 (GAUZE/BANDAGES/DRESSINGS) ×2 IMPLANT
SUT MNCRL 0 VIOLET CTX 36 (SUTURE) ×3 IMPLANT
SUT MNCRL AB 3-0 PS2 27 (SUTURE) ×2 IMPLANT
SUT MONOCRYL 0 CTX 36 (SUTURE) ×3
SUT PLAIN 0 NONE (SUTURE) IMPLANT
SUT PLAIN 2 0 (SUTURE) ×2
SUT PLAIN ABS 2-0 CT1 27XMFL (SUTURE) ×2 IMPLANT
SUT PROLENE 1 CT (SUTURE) IMPLANT
SUT VIC AB 0 CT1 27 (SUTURE) ×1
SUT VIC AB 0 CT1 27XBRD ANBCTR (SUTURE) ×1 IMPLANT
SUT VIC AB 4-0 KS 27 (SUTURE) ×2 IMPLANT
TOWEL OR 17X24 6PK STRL BLUE (TOWEL DISPOSABLE) ×2 IMPLANT
TRAY FOLEY W/BAG SLVR 14FR LF (SET/KITS/TRAYS/PACK) IMPLANT
WATER STERILE IRR 1000ML POUR (IV SOLUTION) ×2 IMPLANT

## 2021-02-03 NOTE — Progress Notes (Addendum)
Patient ID: Mikayla Hicks, female   DOB: [birth year], 35 y.o.   MRN: [id number]  Cramping since 9 am and UCs got worse  Per RN closed Cx but pt writhing in pain Proceed with C/s now for active preterm labor  FHT reactive   Risks/complications of surgery reviewed incl infection, bleeding, damage to internal organs including bladder, bowels, ureters, blood vessels, other risks from anesthesia, VTE and delayed complications of any surgery, complications in future surgery reviewed. Also discussed neonatal complications incl difficult delivery, laceration, vacuum assistance, TTN etc. Pt understands and agrees, all concerns addressed.

## 2021-02-03 NOTE — Anesthesia Preprocedure Evaluation (Addendum)
Anesthesia Evaluation  Patient identified by MRN, date of birth, ID band Patient awake    Reviewed: Allergy & Precautions, NPO status , Patient's Chart, lab work & pertinent test results  History of Anesthesia Complications Negative for: history of anesthetic complications  Airway Mallampati: II   Neck ROM: Full    Dental   Pulmonary neg pulmonary ROS,    Pulmonary exam normal        Cardiovascular negative cardio ROS Normal cardiovascular exam     Neuro/Psych  Headaches, PSYCHIATRIC DISORDERS Anxiety    GI/Hepatic negative GI ROS, Neg liver ROS,   Endo/Other   Obesity   Renal/GU negative Renal ROS     Musculoskeletal negative musculoskeletal ROS (+)   Abdominal   Peds  Hematology negative hematology ROS (+)  Plt 250    Anesthesia Other Findings Hx spinal hemangioma. Per patient (no records available at this time), hemangioma found incidentally after a fall. Partial resection done at T6-7. Was told that the hemangioma extended further cranially, but not caudally, and was not resected further up the spine. Followup MRI done in 2010 with no evidence of growth/change in the hemangioma. Has not developed any symptoms to suggest hemangioma growth.  Reproductive/Obstetrics (+) Pregnancy                            Anesthesia Physical Anesthesia Plan  ASA: 2 and emergent  Anesthesia Plan: Spinal   Post-op Pain Management:    Induction:   PONV Risk Score and Plan: 2 and Treatment may vary due to age or medical condition, Ondansetron and Scopolamine patch - Pre-op  Airway Management Planned: Natural Airway  Additional Equipment: None  Intra-op Plan:   Post-operative Plan:   Informed Consent: I have reviewed the patients History and Physical, chart, labs and discussed the procedure including the risks, benefits and alternatives for the proposed anesthesia with the patient or authorized  representative who has indicated his/her understanding and acceptance.       Plan Discussed with: CRNA and Anesthesiologist  Anesthesia Plan Comments: (Labs reviewed, platelets acceptable. Discussed risks and benefits of spinal, including spinal/epidural hematoma, infection, failed block, and PDPH, with particular focus on bleeding given patient's history of spinal hemangioma. Patient expressed understanding and wished to proceed with plan for spinal anesthetic. )       Anesthesia Quick Evaluation

## 2021-02-03 NOTE — Anesthesia Postprocedure Evaluation (Signed)
Anesthesia Post Note  Patient: Mikayla Hicks  Procedure(s) Performed: Primary CESAREAN SECTION     Patient location during evaluation: PACU Anesthesia Type: Spinal Level of consciousness: awake and alert Pain management: pain level controlled Vital Signs Assessment: post-procedure vital signs reviewed and stable Respiratory status: spontaneous breathing and respiratory function stable Cardiovascular status: blood pressure returned to baseline and stable Postop Assessment: spinal receding and no apparent nausea or vomiting Anesthetic complications: no   No notable events documented.  Last Vitals:  Vitals:   02/03/21 1345 02/03/21 1411  BP: (!) 97/58 (!) 101/58  Pulse: 72 85  Resp: (!) 25 20  Temp: 36.4 C 37.1 C  SpO2: 100% 100%    Last Pain:  Vitals:   02/03/21 1411  TempSrc: Oral  PainSc:    Pain Goal: Patients Stated Pain Goal: 3 (02/01/21 0854)  LLE Motor Response: Purposeful movement (02/03/21 1345)   RLE Motor Response: Purposeful movement (02/03/21 1345)          Audry Pili

## 2021-02-03 NOTE — Transfer of Care (Signed)
Immediate Anesthesia Transfer of Care Note  Patient: Mikayla Hicks  Procedure(s) Performed: Primary CESAREAN SECTION  Patient Location: PACU  Anesthesia Type:Spinal  Level of Consciousness: awake, alert  and oriented  Airway & Oxygen Therapy: Patient Spontanous Breathing  Post-op Assessment: Report given to RN and Post -op Vital signs reviewed and stable  Post vital signs: Reviewed and stable  Last Vitals:  Vitals Value Taken Time  BP 98/66 02/03/21 1232  Temp    Pulse 80 02/03/21 1245  Resp 16 02/03/21 1245  SpO2 100 % 02/03/21 1245  Vitals shown include unvalidated device data.  Last Pain:  Vitals:   02/03/21 1230  TempSrc: Oral  PainSc: 0-No pain      Patients Stated Pain Goal: 3 ([id number] [phone])  Complications: No notable events documented.

## 2021-02-03 NOTE — Lactation Note (Signed)
This note was copied from a baby's chart. Lactation Consultation Note  Patient Name: Boy Mimmie Elvin [id number] Date: 02/03/2021 Reason for consult: Initial assessment;Preterm <34wks;NICU baby;Primapara;1st time breastfeeding;Infant < 6lbs;Other (Comment) (Hx of infertility) Age:33 hours  Visited with mom of 63 hours old pre-term NICU female, < 4 lbs, she's a P1 and reported moderate breast changes during the pregnancy.   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 Pump supplies were brought in the room, OB Specialty care RN is trying to get a pump for her.   Reviewed pumping schedule, lactogenesis II, pumping expectation and employee pumps.  Plan of care:  Encouraged mom to start pumping every 3 hours, at least 8 pumping sessions/24 hours Breast massage and hand expression were also encouraged  FOB present and supportive. All questions and concerns answered, parents to call NICU LC PRN.  Maternal Data Has patient been taught Hand Expression?: Yes Does the patient have breastfeeding experience prior to this delivery?: No  Feeding Mother's Current Feeding Choice: Breast Milk  Lactation Tools Discussed/Used Reason for Pumping: pre-term infant in NICU  Interventions Interventions: Breast feeding basics reviewed;DEBP;Education;Hand express;Breast massage  Discharge Pump: Employee Pump (mom has the form for her [employer] pump) WIC Program: No  Consult Status Consult Status: Follow-up Follow-up type: [place] 02/03/2021, 6:03 PM

## 2021-02-03 NOTE — Op Note (Signed)
02/03/2021 Cesarean Section Procedure Note   Mikayla Hicks  Procedure: Emergency Primary Low Transverse Cesarean Section   Indications: 33.1 weeks. Admitted with PPROM since 23.3 wks. Severe oligohydramnios. Breech Presentation and preterm labor 33.1 wks, suspect chorioamnionitis     Pre-operative Diagnosis: Breech Presentation, Preterm Premature Rupture of Membranes. 33.1 wks  Post-operative Diagnosis: Same   Surgeon:  Azucena Fallen, MD   Assistants: Roseanna Rainbow, CNM   Anesthesia: spinal   Procedure Details:  The patient was seen in the Antepartum Room. The risks, benefits, complications, treatment options, and expected outcomes were discussed with the patient. The patient concurred with the proposed plan, giving informed consent. identified as Mikayla Hicks and the procedure verified as C-Section Delivery.  In OR, a Time Out was held and the above information confirmed.  2 gm Ancef and '500mg'$  Azithromycin given. After induction of anesthesia, the patient was draped and prepped in the usual sterile manner, foley was draining urine well.  Antibiotics- Ancef 2 gm and Azithromycin 1 gm. A pfannenstiel incision was made and carried down through the subcutaneous tissue to the fascia. Fascial incision was made and extended transversely. The fascia was separated from the underlying rectus tissue superiorly and inferiorly. The peritoneum was identified and entered. Peritoneal incision was extended longitudinally. Alexis-O retractor placed. Lower segment was narrow.  The utero-vesical peritoneal reflection was incised transversely and the bladder flap was bluntly freed from the lower uterine segment. A low transverse uterine incision was made. No amniotic fluid noted. Baby was in Complete breech presentation. Baby boy delivered by complete breech extraction steps. Legs delivered gently, back kept anterior, nuchal arms noted, right arm delivered, left arm delivered by turning and baby and reaching  over the shoulder. Head was trapped due to uterine walls clamping down. So incision was extended bilaterally curving up followed by head delivery by keeping face flexed. Baby came out shocky and hence cord clamped and cut immediately and baby handed off to NICU team without delayed cord clamping.  Apgars 1/ 6/ 8 at 1, 5, 10 min. Cord ph was sent- arterial pH 7.33. Cord blood was obtained for evaluation. The placenta was removed, appeared shriveled, friable, dry and fell apart, was sent to path.  The uterine outline, tubes and ovaries appeared normal The uterine incision was closed with running locked sutures of 0Monocryl. A second imbricating layer sutured.  The incision landed up at round ligament attachment level consistent with high lower segment area.  Hemostasis was observed. Alexis retractor removed. Peritoneal closure done with 2-0 Vicryl.  The fascia was then reapproximated with running sutures of 0Vicryl. The subcuticular closure was performed using 2-0plain gut. The skin was closed with 4-0Vicryl. Steristrips/ honeycomb placed.  Instrument, sponge, and needle counts were correct prior the abdominal closure and were correct at the conclusion of the case.   Findings: Breech extraction from [facility] hysterotomy. Thick uterine walls with long standing severe oligohydramnios leading to uterus being tight around the head needing extension of uterine incision but there was no extension to broad ligament or vessels. Hysterotomy landed up being on high lower segment. Apgars 1, 6, 8 but cord pH (art) 7.33   Estimated Blood Loss: 350 cc   Total IV Fluids: 1500 cc LR  Urine Output: 75 cc  Specimens: Cord gas. Cord blood. Placenta    Complications: no complications  Disposition: PACU - hemodynamically stable.   Maternal Condition: stable   Baby condition / location:  NICU, intubated   Attending Attestation: I performed the  procedure.   Signed: Surgeon(s): Azucena Fallen, MD

## 2021-02-03 NOTE — Anesthesia Procedure Notes (Signed)
Spinal  Patient location during procedure: OR Start time: 02/03/2021 11:10 AM End time: 02/03/2021 11:13 AM Reason for block: surgical anesthesia Staffing Performed: anesthesiologist  Anesthesiologist: Audry Pili, MD Preanesthetic Checklist Completed: patient identified, IV checked, risks and benefits discussed, surgical consent, monitors and equipment checked, pre-op evaluation and timeout performed Spinal Block Patient position: sitting Prep: DuraPrep Patient monitoring: heart rate, cardiac monitor, continuous pulse ox and blood pressure Approach: midline Location: L2-3 Injection technique: single-shot Needle Needle type: Pencan  Needle gauge: 24 G Additional Notes Consent was obtained prior to the procedure with all questions answered and concerns addressed. Risks including, but not limited to, bleeding, infection, nerve damage, paralysis, failed block, inadequate analgesia, allergic reaction, high spinal, itching, and headache were discussed and the patient wished to proceed. Functioning IV was confirmed and monitors were applied. Sterile prep and drape, including hand hygiene, mask, and sterile gloves were used. The patient was positioned and the spine was prepped. The skin was anesthetized with lidocaine. Free flow of clear CSF was obtained prior to injecting local anesthetic into the CSF. The spinal needle aspirated freely following injection. The needle was carefully withdrawn. The patient tolerated the procedure well.   Renold Don, MD

## 2021-02-04 LAB — CBC
HCT: 33.1 % — ABNORMAL LOW (ref 36.0–46.0)
Hemoglobin: 11.1 g/dL — ABNORMAL LOW (ref 12.0–15.0)
MCH: 28.4 pg (ref 26.0–34.0)
MCHC: 33.5 g/dL (ref 30.0–36.0)
MCV: 84.7 fL (ref 80.0–100.0)
Platelets: 217 10*3/uL (ref 150–400)
RBC: 3.91 MIL/uL (ref 3.87–5.11)
RDW: 14.1 % (ref 11.5–15.5)
WBC: 20.9 10*3/uL — ABNORMAL HIGH (ref 4.0–10.5)
nRBC: 0 % (ref 0.0–0.2)

## 2021-02-04 LAB — RPR: RPR Ser Ql: NONREACTIVE

## 2021-02-04 MED ORDER — OXYCODONE HCL 5 MG PO TABS
5.0000 mg | ORAL_TABLET | Freq: Four times a day (QID) | ORAL | Status: DC | PRN
  Administered 2021-02-04: 5 mg via ORAL
  Administered 2021-02-05: 10 mg via ORAL
  Administered 2021-02-05: 5 mg via ORAL
  Administered 2021-02-05: 10 mg via ORAL
  Filled 2021-02-04: qty 2
  Filled 2021-02-04: qty 1
  Filled 2021-02-04 (×2): qty 2

## 2021-02-04 NOTE — Progress Notes (Signed)
   Subjective: POD# 1 Live born female  Birth Weight: 3 lb 13.7 oz (1750 g) APGAR: 1, 6  Newborn Delivery   Birth date/time: 02/03/2021 11:39:00 Delivery type: C-Section, Low Transverse Trial of labor: No C-section categorization: Primary     Baby name: Maverick Delivering provider: MODY, VAISHALI  Critical stable in NICU / prematurity circumcision planned Feeding: pumping EBM and storing for now  Pain control at delivery: Spinal   Reports feeling sore but well, out of bed and walking this am.   Patient reports tolerating PO.   Breast symptoms:+ colostrum Pain controlled with  PO meds Denies HA/SOB/C/P/N/V/dizziness. Flatus present. She reports vaginal bleeding as normal, without clots.  She is ambulating, urinating without difficulty.     Objective:   VS:    Vitals:   02/03/21 2040 02/04/21 0005 02/04/21 0920 02/04/21 1115  BP:  (!) 99/46 (!) 104/54 117/63  Pulse: (!) 109 61 77 89  Resp:  '18 17 18  '$ Temp:  98.6 F (37 C) 98.5 F (36.9 C) 98.3 F (36.8 C)  TempSrc:  Oral Oral Oral  SpO2:  98%  98%  Weight:      Height:         Intake/Output Summary (Last 24 hours) at 02/04/2021 1213 Last data filed at 02/04/2021 0516 Gross per 24 hour  Intake 1223.13 ml  Output 2566 ml  Net -1342.87 ml        Recent Labs    02/03/21 0856 02/04/21 0448  WBC 11.4* 20.9*  HGB 12.9 11.1*  HCT 39.3 33.1*  PLT 250 217     Blood type: --/--/A POS (08/26 [id number])  Rubella: Immune (03/24 0000)  Vaccines: TDaP          UTD                    COVID-19 UTD   Physical Exam:  General: alert, cooperative, and no distress CV: Regular rate and rhythm Resp: clear Abdomen: soft, nontender, normal bowel sounds Incision: clean, dry, and intact Uterine Fundus: firm, below umbilicus, nontender Lochia: minimal Ext: no edema, redness or tenderness in the calves or thighs  Assessment/Plan: 35 y.o.   POD# 1. [id number]                  Principal Problem:   Postpartum care following  cesarean delivery 8/26 Active Problems:   Preterm premature rupture of membranes (PPROM) 11/29/2020   Positive GBS test   Anemia   History of spinal hemangioma   Breech presentation   Cesarean delivery - Breech, PPROM, 29w1dlabor   Delivery by emergency cesarean   Doing well, stable.               Advance diet as tolerated Breastfeeding support Encourage to ambulate Routine post-op care [place]orrow or Monday  DJuliene Pina CNM, MSN 02/04/2021, 12:13 PM

## 2021-02-04 NOTE — Lactation Note (Signed)
This note was copied from a baby's chart. 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 Mom pumping when entering the room; she was also using a pumping band, praised her for her efforts.  Mom voiced that she doesn't have her insurance card but she'll try to get it printed for pump issuance. LC will come back this afternoon to check on paperwork for employee pump.  Plan of care:   Encouraged mom to start pumping every 3 hours, at least 8 pumping sessions/24 hours Breast massage and hand expression were also encouraged   No support person at this time. Mom received BF brochure and NICU booklet yesterday on initial consultation. All questions and concerns answered, mom to call NICU LC PRN.   Maternal Data   Mom's milk supply is WNL  Feeding Mother's Current Feeding Choice: Breast Milk  Lactation Tools Discussed/Used Tools: Pump;Flanges Flange Size: 24 Breast pump type: Double-Electric Breast Pump Pump Education: Setup, frequency, and cleaning;Milk Storage Reason for Pumping: pre-term infant in NICU Pumping frequency: q 3 hours Pumped volume: 1 mL (1-2 ml)  Interventions Interventions: Breast feeding basics reviewed;DEBP;Education  Discharge Pump: DEBP;Employee Pump (needs her insurance card in order to issue employee pump)  Consult Status Consult Status: Follow-up Follow-up type: In-patient   Aadhya Bustamante Francene Boyers 02/04/2021, 1:53 PM

## 2021-02-04 NOTE — Lactation Note (Signed)
This note was copied from a baby's chart. Lactation Consultation Note  Patient Name: Mikayla Hicks [id number] Date: 02/04/2021   Age:33 hours  LC came back to see mom and P/U the print out for her insurance card. Mom having a hard time accessing the insurance website to access her card.  LC explained to mom that in order to issue the employee pump, we need to file insurance; she's expecting her discharge tomorrow. FOB present and aware of the situation.  Will check tomorrow with [facility] charge RN if this mom qualifies for a [employer] pump instead; if she's still unable to retrieve her insurance card by tomorrow; she had a baby in NICU and no pump at home.   [place] 02/04/2021, 7:42 PM

## 2021-02-04 NOTE — Progress Notes (Signed)
Pt. Requested help with the breast pump. I educated her on how to set-up, run, disassemble, and clean pump. I discussed with the patient basic breastfeeding/pumping guidelines, and visualized pump being used correctly. Patient was also educated on breast massage and hand expression with demonstration and teach-back method used.  Patient ambulates down hall to NICU and back with no issues. Postpartum assessment WNL.

## 2021-02-05 MED ORDER — OXYCODONE HCL 5 MG PO TABS: 5.0000 mg | ORAL_TABLET | Freq: Four times a day (QID) | ORAL | 0 refills | Status: AC | PRN

## 2021-02-05 MED ORDER — SENNOSIDES-DOCUSATE SODIUM 8.6-50 MG PO TABS: 2.0000 | ORAL_TABLET | Freq: Every day | ORAL | Status: DC

## 2021-02-05 MED ORDER — IBUPROFEN 600 MG PO TABS: 600.0000 mg | ORAL_TABLET | Freq: Four times a day (QID) | ORAL | 0 refills | Status: DC

## 2021-02-05 MED ORDER — SIMETHICONE 80 MG PO CHEW: 80.0000 mg | CHEWABLE_TABLET | ORAL | 0 refills | Status: DC | PRN

## 2021-02-05 MED ORDER — ACETAMINOPHEN 500 MG PO TABS: 1000.0000 mg | ORAL_TABLET | Freq: Four times a day (QID) | ORAL | 2 refills | Status: DC | PRN

## 2021-02-05 NOTE — Lactation Note (Signed)
This note was copied from a baby's chart. Lactation Consultation Note  Patient Name: Mikayla Hicks [id number] Date: 02/05/2021 Reason for consult: Follow-up assessment;Preterm <34wks;Primapara;1st time breastfeeding;Other (Comment);Infant < 6lbs;NICU baby (Hx of infertility, [employer] employee) Age:35 years  Visited with mom of 81 33/18 weeks old (adjusted) female, she's a P1 and possibly getting discharged today. Issued employee pump; mom reports she's pumping consistently but sometimes would skip the pumping session at night.  Reviewed discharge education, engorgement and sore nipples prevention/treatment and power pumping.  Plan of care:   Encouraged mom to pump every 3 hours, at least 8 pumping sessions/24 hours Breast massage, hand expression and coconut oil were also encouraged prior pumping She'll start power pumping in the AM if she misses her pumping session at night   FOB present. All questions and concerns answered, mom to call NICU LC PRN.  Maternal Data   Mom's supply is WNL  Feeding Mother's Current Feeding Choice: Breast Milk (NPO)  Lactation Tools Discussed/Used Tools: Pump;Flanges;Coconut oil Flange Size: 24 Breast pump type: Double-Electric Breast Pump Pump Education: Setup, frequency, and cleaning;Milk Storage Reason for Pumping: pre-term infant in NICU Pumping frequency: 7 times/24 hours Pumped volume: 10 mL  Interventions Interventions: Breast feeding basics reviewed;DEBP;Education;Coconut oil  Discharge Discharge Education: Engorgement and breast care Pump: DEBP;Employee Pump;Personal ([employer] employee pump issued today as her personal pump)  Consult Status Consult Status: Follow-up Follow-up type: In-patient   Mikayla Hicks 02/05/2021, 11:41 AM

## 2021-02-05 NOTE — Discharge Summary (Signed)
OB Discharge Summary  Patient Name: Mikayla Hicks DOB: January 06, 1986 MRN: [id number]  Date of admission: 11/29/2020 Delivering provider: MODY, VAISHALI   Admitting diagnosis: Preterm premature rupture of membranes (PPROM) with unknown onset of labor [O42.919] Delivery by emergency cesarean [O99.892] Intrauterine pregnancy: [redacted]w[redacted]d    Secondary diagnosis: Patient Active Problem List   Diagnosis Date Noted   Cesarean delivery - Breech, PPROM, 33w1dabor 02/03/2021   Postpartum care following cesarean delivery 8/26 02/03/2021   Delivery by emergency cesarean 02/03/2021   Positive GBS test 12/14/2020   Anemia 12/14/2020   History of spinal hemangioma 12/14/2020   Breech presentation 12/14/2020   Preterm premature rupture of membranes (PPROM) 11/29/2020 11/29/2020   Additional problems:none   Date of discharge: 02/05/2021   Discharge diagnosis: Principal Problem:   Postpartum care following cesarean delivery 8/26 Active Problems:   Preterm premature rupture of membranes (PPROM) 11/29/2020   Positive GBS test   Anemia   History of spinal hemangioma   Breech presentation   Cesarean delivery - Breech, PPROM, 3325w1dbor   Delivery by emergency cesarean                                                              Post partum procedures: none  Augmentation: N/A Pain control: Spinal  Laceration:None  Episiotomy:None  Complications: None  Hospital course:  Sceduled C/S   34 14o. yo G1P0101 at 33w65w1d admitted to the hospital 11/29/2020 for 23.3 wks.for PPROM. She was treated during her 67 a67enatal inpatient stay with latency antibiotics, received 2 doses of betamethasone per protocol for fetal lung maturation, followed by growth ultrasounds and Materna-Fetal Medicine consults with weekly biophysical profile. She remained afebrile and no signs of chorioamnionitis throughout her stay until 33.1 wks when she started to have spontaneous contractions. Given Breech Presentation and preterm  labor 33.1 wks, suspect chorioamnionitis   a cesarean section was performed. Delivery details are as follows:  Membrane Rupture Time/Date: 7:30 AM ,11/29/2020   Delivery Method:C-Section, Low Transverse  Details of operation can be found in separate operative note.  Patient had an uncomplicated postpartum course.  She is ambulating, tolerating a regular diet, passing flatus, and urinating well. Patient is discharged home in stable condition on  02/05/21        Newborn Data: Birth date:02/03/2021  Birth time:11:39 AM  Gender:Female  Living status:Living  Apgars:1 ,6  Weight:1750 g     Physical exam  Vitals:   02/04/21 2238 02/05/21 0603 02/05/21 0605 02/05/21 1118  BP: (!) 108/54 (!) 96/55  123/88  Pulse: 68 68  92  Resp: '18 16  16  '$ Temp: 98.1 F (36.7 C) 98 F (36.7 C)  97.7 F (36.5 C)  TempSrc: Oral Oral  Oral  SpO2: 99% 99% 99% 99%  Weight:      Height:       General: alert, cooperative, and no distress Lochia: appropriate Uterine Fundus: firm Incision: Dressing is clean, dry, and intact DVT Evaluation: No cords or calf tenderness. No significant calf/ankle edema. Labs: Lab Results  Component Value Date   WBC 20.9 (H) 02/04/2021   HGB 11.1 (L) 02/04/2021   HCT 33.1 (L) 02/04/2021   MCV 84.7 02/04/2021   PLT 217 02/04/2021   CMP Latest Ref Rng & Units 11/29/2020  Glucose 70 - 99 mg/dL 87  BUN 6 - 20 mg/dL 8  Creatinine 0.44 - 1.00 mg/dL 0.53  Sodium 135 - 145 mmol/L 136  Potassium 3.5 - 5.1 mmol/L 3.9  Chloride 98 - 111 mmol/L 107  CO2 22 - 32 mmol/L 21(L)  Calcium 8.9 - 10.3 mg/dL 8.6(L)  Total Protein 6.5 - 8.1 g/dL 6.1(L)  Total Bilirubin 0.3 - 1.2 mg/dL 0.4  Alkaline Phos 38 - 126 U/L 63  AST 15 - 41 U/L 14(L)  ALT 0 - 44 U/L 15   Edinburgh Postnatal Depression Scale Screening Tool 02/04/2021  I have been able to laugh and see the funny side of things. 1  I have looked forward with enjoyment to things. 0  I have blamed myself unnecessarily when things went  wrong. 2  I have been anxious or worried for no good reason. 0  I have felt scared or panicky for no good reason. 0  Things have been getting on top of me. 2  I have been so unhappy that I have had difficulty sleeping. 0  I have felt sad or miserable. 1  I have been so unhappy that I have been crying. 1  The thought of harming myself has occurred to me. 0  Edinburgh Postnatal Depression Scale Total 7   Vaccines: TDaP          UTD        COVID-19   UTD  Discharge instruction:  per After Visit Summary,  [facility] booklet and  "Understanding Mother & Baby Care" hospital booklet  After Visit Meds:  Allergies as of 02/05/2021   No Known Allergies      Medication List     STOP taking these medications    busPIRone 7.5 MG tablet Commonly known as: BUSPAR       TAKE these medications    acetaminophen 500 MG tablet Commonly known as: TYLENOL Take 2 tablets (1,000 mg total) by mouth every 6 (six) hours as needed.   b complex vitamins capsule Take 1 capsule by mouth daily.   cetirizine 10 MG chewable tablet Commonly known as: ZYRTEC Chew 10 mg by mouth daily.   cholecalciferol 25 MCG (1000 UNIT) tablet Commonly known as: VITAMIN D3 Take 1,000 Units by mouth daily.   Fish Oil 1000 MG Caps Take by mouth.   ibuprofen 600 MG tablet Commonly known as: ADVIL Take 1 tablet (600 mg total) by mouth every 6 (six) hours.   multivitamin-prenatal 27-0.8 MG Tabs tablet Take 1 tablet by mouth daily at 12 noon.   oxyCODONE 5 MG immediate release tablet Commonly known as: Oxy IR/ROXICODONE Take 1 tablet (5 mg total) by mouth every 6 (six) hours as needed for up to 5 days for moderate pain.   senna-docusate 8.6-50 MG tablet Commonly known as: Senokot-S Take 2 tablets by mouth daily. Start taking on: February 06, 2021   simethicone 80 MG chewable tablet Commonly known as: MYLICON Chew 1 tablet (80 mg total) by mouth as needed for flatulence.        Diet: iron rich  diet  Activity: Advance as tolerated. Pelvic rest for 6 weeks.   Postpartum contraception: Not Discussed  Newborn Data: Live born female  Birth Weight: 3 lb 13.7 oz (1750 g) APGAR: 1, 6  Newborn Delivery   Birth date/time: 02/03/2021 11:39:00 Delivery type: C-Section, Low Transverse Trial of labor: No C-section categorization: Primary      named Maverick Baby Feeding:  pumping for now  Disposition:NICU Circumcision: planned at NICU discharge   Delivery Report:  Review the Delivery Report for details.    Follow up:  Follow-up Information     Law, Cassandra A, DO. Schedule an appointment as soon as possible for a visit in 6 week(s).   Specialty: Obstetrics and Gynecology Contact information: [address] [place] [id number] [phone]                   Signed: Otilio Carpen, MSN 02/05/2021, 11:32 AM

## 2021-02-05 NOTE — Progress Notes (Signed)
Patient Postpartum assessment WNL. Pt states understanding with discharge information provided by RN. No questions.

## 2021-02-05 NOTE — Progress Notes (Signed)
   Subjective: POD# 2 Live born female  Birth Weight: 3 lb 13.7 oz (1750 g) APGAR: 1, 6  Newborn Delivery   Birth date/time: 02/03/2021 11:39:00 Delivery type: C-Section, Low Transverse Trial of labor: No C-section categorization: Primary     Baby name: Maverick Delivering provider: MODY, VAISHALI  Critical stable in NICU / prematurity circumcision planned Feeding: pumping EBM and storing for now    Pain control at delivery: Spinal   Reports feeling better this morning, yesterday pain was increased in evening after spending most of the day in NICU.  Desires DC home today. Denies HA/SOB/C/P/N/V/dizziness. Flatus present. She reports vaginal bleeding as normal, without clots.  She is ambulating, urinating without difficulty.     Objective:   VS:    Vitals:   02/04/21 1725 02/04/21 1941 02/04/21 2238 02/05/21 0603  BP: 103/60 126/71 (!) 108/54 (!) 96/55  Pulse: 69 94 68 68  Resp: '18 20 18 16  '$ Temp: 97.6 F (36.4 C) 98.1 F (36.7 C) 98.1 F (36.7 C) 98 F (36.7 C)  TempSrc: Oral Oral Oral Oral  SpO2:  99% 99% 99%  Weight:      Height:         Intake/Output Summary (Last 24 hours) at 02/05/2021 1021 Last data filed at 02/04/2021 1227 Gross per 24 hour  Intake --  Output 400 ml  Net -400 ml        Recent Labs    02/03/21 0856 02/04/21 0448  WBC 11.4* 20.9*  HGB 12.9 11.1*  HCT 39.3 33.1*  PLT 250 217     Blood type: --/--/A POS (08/26 [id number])  Rubella: Immune (03/24 0000)  Vaccines: TDaP          UTD                    COVID-19 UTD   Physical Exam:  General: alert, cooperative, and no distress Abdomen: soft, nontender, normal bowel sounds Incision: clean, dry, and intact Uterine Fundus: firm, below umbilicus, nontender Lochia: minimal Ext: no edema, redness or tenderness in the calves or thighs  Assessment/Plan: 35 y.o.   POD# 2. [id number]                  Principal Problem:   Postpartum care following cesarean delivery 8/26 Active Problems:    Preterm premature rupture of membranes (PPROM) 11/29/2020   Positive GBS test   Anemia   History of spinal hemangioma   Breech presentation   Cesarean delivery - Breech, PPROM, 71w1dlabor   Delivery by emergency cesarean   Doing well, stable.    Routine post-op care  DC home today F/U [facility] in 6 wks and PRN  DJuliene Pina CNM, MSN 02/05/2021, 10:21 AM

## 2021-02-05 NOTE — Plan of Care (Signed)
  Problem: Clinical Measurements: Goal: Diagnostic test results will improve Outcome: Completed/Met   Problem: Education: Goal: Knowledge of condition will improve Outcome: Completed/Met Goal: Individualized Educational Video(s) Outcome: Completed/Met Goal: Individualized Newborn Educational Video(s) Outcome: Completed/Met   Problem: Activity: Goal: Will verbalize the importance of balancing activity with adequate rest periods Outcome: Completed/Met Goal: Ability to tolerate increased activity will improve Outcome: Completed/Met   Problem: Coping: Goal: Ability to identify and utilize available resources and services will improve Outcome: Completed/Met   Problem: Life Cycle: Goal: Chance of risk for complications during the postpartum period will decrease Outcome: Completed/Met   Problem: Role Relationship: Goal: Ability to demonstrate positive interaction with newborn will improve Outcome: Completed/Met   Problem: Skin Integrity: Goal: Demonstration of wound healing without infection will improve Outcome: Completed/Met

## 2021-02-06 ENCOUNTER — Ambulatory Visit: Payer: Self-pay

## 2021-02-06 LAB — SURGICAL PATHOLOGY

## 2021-02-06 NOTE — Lactation Note (Signed)
This note was copied from a baby's chart. Lactation Consultation Note Mother is pumping frequently and using ice/heat to avoid engorgement.   Patient Name: Mikayla Hicks [id number] Date: 02/06/2021 Reason for consult: Follow-up assessment Age:33 hours  Maternal Data  +breast changes with onset of copious milk.  Breast fullness today but without engorgement   Feeding Mother's Current Feeding Choice: Breast Milk   Lactation Tools Discussed/Used Pumping frequency: q3 Pumped volume: 60 mL  Interventions Interventions: Education (heat packets)  Discharge Discharge Education: Engorgement and breast care  Consult Status Consult Status: Follow-up Follow-up type: [place], [place] IBCLC 02/06/2021, 6:15 PM

## 2021-02-08 ENCOUNTER — Ambulatory Visit: Payer: Self-pay

## 2021-02-08 NOTE — Lactation Note (Signed)
This note was copied from a baby's chart. Lactation Consultation Note Mother continues to pump frequently. No s/s of engorgement.   Patient Name: Mikayla Hicks [id number] Date: 02/08/2021 Reason for consult: Follow-up assessment Age:33 days   Lactation Tools Discussed/Used Pumped volume: 180 mL  Consult Status Consult Status: Follow-up Date: 02/08/21 Follow-up type: [place], [place] IBCLC 02/08/2021, 2:33 PM

## 2021-02-13 ENCOUNTER — Ambulatory Visit: Payer: Self-pay

## 2021-02-13 NOTE — Lactation Note (Signed)
This note was copied from a baby's chart. Lactation Consultation Note Mother continues to pump frequently and with normal milk volume.   Patient Name: Mikayla Hicks [id number] Date: 02/13/2021 Reason for consult: Follow-up assessment Age:33 days  Maternal Data  Pumping frequency: q3 Pumped volume: 90 mL  Feeding Mother's Current Feeding Choice: Breast Milk  Interventions Interventions: Education  Consult Status Consult Status: Follow-up Date: 02/13/21 Follow-up type: In-patient   Gwynne Edinger 02/13/2021, 5:24 PM

## 2021-02-15 ENCOUNTER — Telehealth (HOSPITAL_COMMUNITY): Payer: Self-pay | Admitting: *Deleted

## 2021-02-15 NOTE — Telephone Encounter (Signed)
Attempted Hospital Discharge Follow-Up Call.  Left voice mail requesting that patient return RN's phone call.  

## 2021-03-02 ENCOUNTER — Ambulatory Visit: Payer: Self-pay

## 2021-03-02 NOTE — Lactation Note (Signed)
This note was copied from a baby's chart. Lactation Consultation Note  Patient Name: Mikayla Hicks WCHEN'I Date: 03/02/2021 Reason for consult: Follow-up assessment;1st time breastfeeding;NICU baby;Preterm <34wks Age:33 wk.o.  Lactation followed up with Mikayla Hicks. She was pumping upon entry. [name]. She states that she obtains more volume with the symphony pump as compared with her freemie pump at home. I recommended that she hand express breasts 5-10 minutes after pumping to better empty her breasts. I also educated on the role of power pumping to support milk production.  I educated on her pumping volume goals for baby as he continues to grow. I recommended adding in additional pumping sessions, as needed, to support milk maintenance.  Maternal Data Has patient been taught Hand Expression?:  (unsure; mom will need review) Does the patient have breastfeeding experience prior to this delivery?: No  Feeding Mother's Current Feeding Choice: Breast Milk   Lactation Tools Discussed/Used Tools: Pump Breast pump type: Double-Electric Breast Pump Pump Education: Setup, frequency, and cleaning Reason for Pumping: NICU Pumping frequency: 7 times/day Pumped volume: 75 mL (60- home pump; 90-120 - symphony)  Interventions Interventions: Education;DEBP;Breast feeding basics reviewed  Discharge Pump: DEBP;Personal  Consult Status Consult Status: Follow-up Date: 03/02/21 Follow-up type: In-patient    Lenore Manner 03/02/2021, 1:08 PM

## 2021-03-11 ENCOUNTER — Ambulatory Visit: Payer: Self-pay

## 2021-03-11 NOTE — Lactation Note (Signed)
This note was copied from a baby's chart. Lactation Consultation Note Returned to assist. Mom and baby in laid back position. Baby did not cue or wake. Will return prn to further assist.   Patient Name: Mikayla Hicks [id number] Date: 03/11/2021 Reason for consult: Breastfeeding assistance Age:33 wk.o.  Lactation Tools Discussed/Used Tools: Pump Pumping frequency: 60-90 mL q 3-4 hours  Interventions Interventions: Breast feeding basics reviewed;Support pillows;Education  Consult Status Consult Status: Follow-up Date: 03/11/21 Follow-up type: In-patient   Gwynne Edinger 03/11/2021, 3:48 PM

## 2021-03-11 NOTE — Lactation Note (Signed)
This note was copied from a baby's chart.  Lactation Consultation Note  Patient Name: Boy Laine Fonner [id number] Date: 03/11/2021 Age:35   Subjective Reason for consult: Weekly NICU follow-up Mother reports that baby has latched twice with brief bf'ing episodes. Per her recall, she pumps q 3-4 hours with 60-30mL average.  Objective Infant data: Mother's Current Feeding Choice: Breast Milk   Maternal data: G1P0101  C-Section, Low Transverse Pumping frequency: 60-90 mL q 3-4 hours  Assessment Maternal: Milk volume: Low Mother's milk supply is established and her volume may increase to normal when baby begins bf'ing.   Intervention/Plan Interventions: Tour manager education  Plan: Consult Status: Follow-up NICU Follow-up type: Assist with IDF-1 (Mother to pre-pump before breastfeeding) (will return today for 3pm feeding)    Gwynne Edinger 03/11/2021, 12:41 PM

## 2021-03-15 ENCOUNTER — Ambulatory Visit: Payer: Self-pay

## 2021-03-15 NOTE — Lactation Note (Signed)
This note was copied from a baby's chart. Lactation Consultation Note LC to room for lick & learn. Baby was gavage fed prior to latch attempt. Maverick slept at breast and did not demonstrate feeding cues. We will re-challenge on Friday at 1100.  Patient Name: Mikayla Hicks [id number] Date: 03/15/2021 Reason for consult: Follow-up assessment;Breastfeeding assistance Age:33 wk.o.   Feeding Mother's Current Feeding Choice: Breast Milk  LATCH Score Latch: Too sleepy or reluctant, no latch achieved, no sucking elicited.  Audible Swallowing: None  Type of Nipple: Everted at rest and after stimulation  Comfort (Breast/Nipple): Soft / non-tender  Hold (Positioning): Assistance needed to correctly position infant at breast and maintain latch.  LATCH Score: 5   Interventions Interventions: Education;Support pillows;Assisted with latch;Tour manager education  Consult Status Consult Status: Follow-up Date: 03/15/21 Follow-up type: In-patient   Gwynne Edinger 03/15/2021, 12:31 PM

## 2021-03-17 ENCOUNTER — Ambulatory Visit: Payer: Self-pay

## 2021-03-17 NOTE — Lactation Note (Signed)
This note was copied from a baby's chart. Lactation Consultation Note Mother is increasingly more comfortable positioning baby for bf'ing. However, Maverick remained sleepy and uninterested in bf'ing again today. We applied a nipple shield to ease latch. He was willing to hold it in his mouth but I did not observe nutritive suckling or feeding interest. I left baby sleeping at breast. Mother will continue offering opportunities to bf and I will plan f/u on Monday at 2pm feeding time.   Patient Name: Mikayla Hicks [id number] Date: 03/17/2021 Reason for consult: Breastfeeding assistance;Follow-up assessment Age:44 wk.o.   Feeding Mother's Current Feeding Choice: Breast Milk  LATCH Score Latch: Too sleepy or reluctant, no latch achieved, no sucking elicited.  Audible Swallowing: None  Type of Nipple: Everted at rest and after stimulation  Comfort (Breast/Nipple): Soft / non-tender  Hold (Positioning): Assistance needed to correctly position infant at breast and maintain latch.  LATCH Score: 5   Lactation Tools Discussed/Used  72mm shield  Interventions Interventions: Education;Breast feeding basics reviewed;Assisted with latch;Position options  Consult Status Consult Status: Follow-up Date: 03/17/21 Follow-up type: In-patient    Gwynne Edinger 03/17/2021, 2:53 PM

## 2021-03-19 ENCOUNTER — Ambulatory Visit: Payer: Self-pay

## 2021-03-19 NOTE — Lactation Note (Signed)
This note was copied from a baby's chart. Lactation Consultation Note  Patient Name: Mikayla Hicks [id number] Date: 03/19/2021 Reason for consult: Follow-up assessment;Primapara;1st time breastfeeding;NICU baby;Other (Comment);Term ([employer] employee) Age:33 wk.o.  Visited with mom of 59 40/70 weeks old (adjusted) pre-term female, she's a P1 and RT requested LC to provide mom with a pumping band.  Mom was getting ready to latch baby to breast with a NS but baby wasn't cueing or showing interest in the feeding. LC attempted to do some finger feeding but baby would rather bite than suck. He also has a strong gagging reflex when trying to do suck training, d/c attempt due to disengagement cues.  Mom reports that pumping is going well, she'd still like to see [facility] tomorrow for her appt at 2 pm for feeding assist.   Maternal Data   Mom's supply is slowing increasing but still BNL  Feeding Mother's Current Feeding Choice: Breast Milk  Lactation Tools Discussed/Used Tools: Pump;Flanges Flange Size: 24 Breast pump type: Double-Electric Breast Pump;Other (comment) (pumping band size "M") Pump Education: Setup, frequency, and cleaning;Milk Storage Reason for Pumping: NICU stay Pumping frequency: 7-8 times/24 hours Pumped volume: 60 mL (60-120 ml)  Discharge Pump: DEBP;Personal  Plan of care:   Encouraged mom to continue pumping every 3 hours, at least 8 pumping sessions/24 hours She'll continue power pumping in the AM if she misses her pumping session at night; but understands pumping at night is paramount to protect her supply   GOB present. All questions and concerns answered, mom to call NICU LC PRN.   Consult Status Consult Status: Follow-up Date: 03/19/21 Follow-up type: In-patient   Mikayla Hicks 03/19/2021, 2:24 PM

## 2021-03-21 ENCOUNTER — Ambulatory Visit: Payer: Self-pay

## 2021-03-21 NOTE — Lactation Note (Signed)
This note was copied from a baby's chart. Lactation Consultation Note  Patient Name: Mikayla Hicks [id number] Date: 03/21/2021 Reason for consult: Follow-up assessment;1st time breastfeeding;Primapara;NICU baby;Breastfeeding assistance Age:33 wk.o.  Visited with mom of 17 44/67 weeks old (adjusted) NICU female, she's a P1 and requested another feeding assist. Mom feel comfortable positioning baby, he latched on after a few attempts in cross cradle position to the right breast, but took several breaks on and off.  Mom commented baby bite her, and it took a few minutes to transition to NNS pattern, he didn't show consistently with this either, spitting nipple off his mouth a few times. Mom and baby doing STS when exiting the room.  Maternal Data   Mom's supply is BNL, she reported that she got her period last week and it dwindled, but she's been power pumping since then and taking mother's milk supplement, and has got her supply slightly increased, but it's still BNL  Feeding Mother's Current Feeding Choice: Breast Milk  LATCH Score Latch: Repeated attempts needed to sustain latch, nipple held in mouth throughout feeding, stimulation needed to elicit sucking reflex.  Audible Swallowing: A few with stimulation (3 during this 20 minute feeding, first one was probably saliva)  Type of Nipple: Everted at rest and after stimulation  Comfort (Breast/Nipple): Soft / non-tender  Hold (Positioning): No assistance needed to correctly position infant at breast.  LATCH Score: 8   Lactation Tools Discussed/Used Tools: Pump Flange Size: 24 Breast pump type: Double-Electric Breast Pump Pump Education: Setup, frequency, and cleaning;Milk Storage Reason for Pumping: NICU stay Pumping frequency: 7-8 times/24 hours Pumped volume: 75 mL (75-90 ml)  Interventions Interventions: Assisted with latch;Skin to skin;Breast massage;Hand express;Breast compression;Support pillows;DEBP;Education  Plan of  care:   Encouraged mom to continue pumping every 3 hours, at least 8 pumping sessions/24 hours She'll continue power pumping in the AM if she misses her pumping session at night; but understands pumping at night is paramount to protect her supply She'll continue putting baby to a pumped breast based on feeding cues and readiness  All questions and concerns answered, mom to call NICU LC PRN.  Discharge Pump: DEBP;Personal  Consult Status Consult Status: Follow-up Date: 03/21/21 Follow-up type: In-patient   Hansini Clodfelter Francene Boyers 03/21/2021, 11:46 AM

## 2021-03-27 ENCOUNTER — Ambulatory Visit: Payer: Self-pay

## 2021-03-27 NOTE — Lactation Note (Signed)
This note was copied from a baby's chart.  NICU Lactation Consultation Note  Patient Name: Mikayla Hicks [id number] Date: 03/27/2021 Age:33 wk.o.   Subjective Reason for consult: Weekly NICU follow-up Mother continues to pump frequently. She and baby are working on breastfeeding. He latches better with a nipple shield. Mother is comfortable positioning and latching.   Objective Infant data: Mother's Current Feeding Choice: Breast Milk  Infant feeding assessment Scale for Readiness: 2 Scale for Quality: 4 (infant drowsy, uninterested in bottle)    Maternal data: G1P0101  C-Section, Low Transverse   Assessment Maternal: Mother has increasing level of comfort with positioning /latch  Intervention/Plan Interventions: Tour manager education  Plan: Consult Status: Follow-up  NICU Follow-up type: Assist with IDF-2 (Mother does not need to pre-pump before breastfeeding) Mother to have [place] paged at next feeding time if baby is awake and cuing. LC will plan return visit to observe.    Gwynne Edinger 03/27/2021, 3:35 PM

## 2021-04-07 ENCOUNTER — Ambulatory Visit: Payer: Self-pay

## 2021-04-07 NOTE — Lactation Note (Signed)
This note was copied from a baby's chart.  NICU Lactation Consultation Note  Patient Name: Mikayla Hicks [id number] Date: 04/07/2021 Age:33 m.o.   Subjective Reason for consult: Weekly NICU follow-up Mother continues to pump frequently. She and baby are working on breast and bottle.   Objective Infant data: Mother's Current Feeding Choice: Breast Milk  Infant feeding assessment Scale for Readiness: 2 Scale for Quality: 5 (fell asleep at beginning of feed)    Maternal data: G1P0101  C-Section, Low Transverse  Pumping frequency: 81mL q3  Assessment Infant:  Maternal: Milk supply is wnl   Intervention/Plan Plan: Consult Status: Follow-up  NICU Follow-up type: Weekly NICU follow up; Assist with IDF-2 (Mother does not need to pre-pump before breastfeeding)    Gwynne Edinger 04/07/2021, 4:55 PM

## 2022-01-15 ENCOUNTER — Other Ambulatory Visit (HOSPITAL_COMMUNITY): Payer: Self-pay

## 2022-01-15 ENCOUNTER — Ambulatory Visit (INDEPENDENT_AMBULATORY_CARE_PROVIDER_SITE_OTHER): Payer: Medicaid Other | Admitting: Medical

## 2022-01-15 ENCOUNTER — Encounter: Payer: Self-pay | Admitting: Medical

## 2022-01-15 VITALS — BP 112/70 | HR 82 | Temp 98.3°F | Ht 70.0 in | Wt 257.8 lb

## 2022-01-15 DIAGNOSIS — F988 Other specified behavioral and emotional disorders with onset usually occurring in childhood and adolescence: Secondary | ICD-10-CM

## 2022-01-15 DIAGNOSIS — F419 Anxiety disorder, unspecified: Secondary | ICD-10-CM

## 2022-01-15 DIAGNOSIS — Z79899 Other long term (current) drug therapy: Secondary | ICD-10-CM

## 2022-01-15 MED ORDER — LISDEXAMFETAMINE DIMESYLATE 20 MG PO CAPS
20.0000 mg | ORAL_CAPSULE | Freq: Every day | ORAL | 0 refills | Status: DC
  Filled 2022-01-15: qty 30, 30d supply, fill #0

## 2022-01-15 MED ORDER — BUSPIRONE HCL 7.5 MG PO TABS
7.5000 mg | ORAL_TABLET | Freq: Two times a day (BID) | ORAL | 3 refills | Status: DC
  Filled 2022-01-15: qty 60, 30d supply, fill #0

## 2022-01-15 NOTE — Patient Instructions (Addendum)
Attention deficit- scored overall high on self report scale. Will rx vyvanse 20 mg daily dose as you used before. Have you sign controlled med contract and give uds.  For anxiety- buspar 7.5 mg twice daily dose.  On review depressed mood with moderate high score on testing. On discussion you feel anxiety is worse. After month follow up consider low dose ssri.  Follow up in one month or sooner if needed.

## 2022-01-15 NOTE — Progress Notes (Signed)
Subjective:    Patient ID: Mikayla Hicks, female    DOB: Mar 11, 1986, 36 y.o.   MRN: [id number]  HPI  Pt in for ADHD.  Pt found out she was pregnant more than a year ago and she stopped during pregnancy a during breast feeding. Now child almost one year old and bottle feeding.  I had fist prescribed med back in 08-31-2019. Decided to try wellbutrin as back then had some depression and adhd. Pt states that made her hair fall out. So was switched to vyvanse. She used the 20 mg dose. When on reports helped concentration.  Pt phq-9 score 12.   She does have history of anxiety. She states in past when used it did help control anxiety. GAD- 7 17   Review of Systems  Constitutional:  Negative for chills, fatigue and fever.  HENT:  Negative for congestion, drooling and ear pain.   Respiratory:  Negative for cough, chest tightness, shortness of breath and wheezing.   Cardiovascular:  Negative for chest pain and palpitations.  Gastrointestinal:  Negative for abdominal pain.  Genitourinary:  Negative for dysuria.  Musculoskeletal:  Negative for back pain.  Skin:  Negative for rash.  Neurological:  Negative for dizziness, seizures, weakness and light-headedness.  Hematological:  Negative for adenopathy. Does not bruise/bleed easily.  Psychiatric/Behavioral:  Positive for decreased concentration. Negative for behavioral problems, dysphoric mood and suicidal ideas. The patient is not nervous/anxious.     Past Medical History:  Diagnosis Date   Allergy    Anxiety    just recently and seeing therapist.   Hemangioma vertebral column    Migraines      Social History   Socioeconomic History   Marital status: Divorced    Spouse name: Not on file   Number of children: Not on file   Years of education: Not on file   Highest education level: Not on file  Occupational History   Not on file  Tobacco Use   Smoking status: Never   Smokeless tobacco: Never  Vaping Use   Vaping Use:  Never used  Substance and Sexual Activity   Alcohol use: Yes    Comment: rare and if does very little.   Drug use: No   Sexual activity: Yes    Birth control/protection: None  Other Topics Concern   Not on file  Social History Narrative   Not on file   Social Determinants of Health   Financial Resource Strain: Not on file  Food Insecurity: Not on file  Transportation Needs: Not on file  Physical Activity: Not on file  Stress: Not on file  Social Connections: Not on file  Intimate Partner Violence: Not on file    Past Surgical History:  Procedure Laterality Date   BACK SURGERY     CESAREAN SECTION N/A 02/03/2021   Procedure: Primary CESAREAN SECTION;  Surgeon: Azucena Fallen, MD;  Location: [facility];  Service: Obstetrics;  Laterality: N/A;  EDD: 03/25/21    Family History  Problem Relation Age of Onset   Healthy Mother    Healthy Father     No Known Allergies  Current Outpatient Medications on File Prior to Visit  Medication Sig Dispense Refill   b complex vitamins capsule Take 1 capsule by mouth daily.     cetirizine (ZYRTEC) 10 MG chewable tablet Chew 10 mg by mouth daily.     cholecalciferol (VITAMIN D3) 25 MCG (1000 UNIT) tablet Take 1,000 Units by mouth daily.  ibuprofen (ADVIL) 600 MG tablet Take 1 tablet (600 mg total) by mouth every 6 (six) hours. 30 tablet 0   Omega-3 Fatty Acids (FISH OIL) 1000 MG CAPS Take by mouth.     [DISCONTINUED] buPROPion (WELLBUTRIN XL) 300 MG 24 hr tablet Take 1 tablet (300 mg total) by mouth daily. (Patient not taking: No sig reported) 30 tablet 3   No current facility-administered medications on file prior to visit.    BP 112/70   Pulse 82   Temp 98.3 F (36.8 C) (Oral)   Ht '5\' 10"'$  (1.778 m)   Wt 257 lb 12.8 oz (116.9 kg)   LMP 12/12/2021   SpO2 98%   Breastfeeding No   BMI 36.99 kg/m        Objective:   Physical Exam  General Mental Status- Alert. General Appearance- Not in acute distress.   Skin General:  Color- Normal Color. Moisture- Normal Moisture.  Neck Carotid Arteries- Normal color. Moisture- Normal Moisture. No carotid bruits. No JVD.  Chest and Lung Exam Auscultation: Breath Sounds:-Normal.  Cardiovascular Auscultation:Rythm- Regular. Murmurs & Other Heart Sounds:Auscultation of the heart reveals- No Murmurs.  Abdomen Inspection:-Inspeection Normal. Palpation/Percussion:Note:No mass. Palpation and Percussion of the abdomen reveal- Non Tender, Non Distended + BS, no rebound or guarding.  Neurologic Cranial Nerve exam:- CN III-XII intact(No nystagmus), symmetric smile. Strength:- 5/5 equal and symmetric strength both upper and lower extremities.       Assessment & Plan:   Patient Instructions  Attention deficit- scored overall high on self report scale. Will rx vyvanse 20 mg daily dose as you used before. Have you sign controlled med contract and give uds.  For anxiety- buspar 7.5 mg twice daily dose.  On review depressed mood with moderate high score on testing. On discussion you feel anxiety is worse. After month follow up consider low dose ssri.  Follow up in one month or sooner if needed.         Mackie Pai, PA-C

## 2022-01-18 LAB — DM TEMPLATE

## 2022-01-18 LAB — DRUG MONITORING, PANEL 8 WITH CONFIRMATION, URINE
6 Acetylmorphine: NEGATIVE ng/mL (ref <10)
Alcohol Metabolites: POSITIVE ng/mL — AB (ref <500)
Amphetamines: NEGATIVE ng/mL (ref <500)
Benzodiazepines: NEGATIVE ng/mL (ref <100)
Buprenorphine, Urine: NEGATIVE ng/mL (ref <5)
Cocaine Metabolite: NEGATIVE ng/mL (ref <150)
Creatinine: 252.8 mg/dL (ref >=20.0)
Ethyl Glucuronide (ETG): >10000 ng/mL — ABNORMAL HIGH (ref <500)
Ethyl Sulfate (ETS): 4737 ng/mL — ABNORMAL HIGH (ref <100)
MDMA: NEGATIVE ng/mL (ref <500)
Marijuana Metabolite: NEGATIVE ng/mL (ref <20)
Opiates: NEGATIVE ng/mL (ref <100)
Oxidant: NEGATIVE ug/mL (ref <200)
Oxycodone: NEGATIVE ng/mL (ref <100)
pH: 5.9 (ref 4.5–9.0)

## 2022-02-15 ENCOUNTER — Ambulatory Visit: Payer: Medicaid Other | Admitting: Medical

## 2022-06-21 ENCOUNTER — Telehealth: Payer: Self-pay | Admitting: Medical

## 2022-06-21 NOTE — Telephone Encounter (Signed)
Patient requesting transfer from [facility] to [facility]. Patient says she would feel more comfortable with a female provider. Please advise if ok to transfer.

## 2022-06-22 ENCOUNTER — Telehealth: Payer: Self-pay | Admitting: Medical

## 2022-06-22 NOTE — Telephone Encounter (Signed)
Lvm for patient to callback to schedule appt with Earlie Counts. Both providers approved transfer

## 2022-07-25 ENCOUNTER — Other Ambulatory Visit (HOSPITAL_COMMUNITY): Payer: Self-pay

## 2022-07-25 ENCOUNTER — Ambulatory Visit: Payer: Medicaid Other | Admitting: Family

## 2022-07-25 ENCOUNTER — Encounter: Payer: Self-pay | Admitting: Family

## 2022-07-25 VITALS — BP 126/86 | HR 75 | Temp 98.0°F | Resp 16 | Wt 260.0 lb

## 2022-07-25 DIAGNOSIS — F32A Depression, unspecified: Secondary | ICD-10-CM | POA: Diagnosis not present

## 2022-07-25 DIAGNOSIS — Z309 Encounter for contraceptive management, unspecified: Secondary | ICD-10-CM | POA: Diagnosis not present

## 2022-07-25 DIAGNOSIS — F419 Anxiety disorder, unspecified: Secondary | ICD-10-CM

## 2022-07-25 DIAGNOSIS — F909 Attention-deficit hyperactivity disorder, unspecified type: Secondary | ICD-10-CM | POA: Diagnosis not present

## 2022-07-25 DIAGNOSIS — Z0000 Encounter for general adult medical examination without abnormal findings: Secondary | ICD-10-CM | POA: Insufficient documentation

## 2022-07-25 MED ORDER — ESCITALOPRAM OXALATE 10 MG PO TABS
ORAL_TABLET | ORAL | 0 refills | Status: DC
  Filled 2022-07-25: qty 30, 30d supply, fill #0

## 2022-07-25 MED ORDER — LISDEXAMFETAMINE DIMESYLATE 20 MG PO CAPS
20.0000 mg | ORAL_CAPSULE | Freq: Every day | ORAL | 0 refills | Status: DC
  Filled 2022-07-25: qty 30, 30d supply, fill #0

## 2022-07-25 NOTE — Assessment & Plan Note (Signed)
Uncontrolled. Will give trial of lexapro 10 mg.  Begin 5 mg nightly x 1 week, then increase to a full tab nightly on week two.

## 2022-07-25 NOTE — Assessment & Plan Note (Signed)
Uncontrolled. Will restart vyvanse. If still uncontrolled next visit after we get lexapro on board, we can adjust at that time.

## 2022-07-25 NOTE — Assessment & Plan Note (Signed)
Symptoms seem to be more depression at this point but lexapro should be helpful for both.

## 2022-07-25 NOTE — Assessment & Plan Note (Signed)
She is interested in IUD placement. Will refer to GYN.

## 2022-07-25 NOTE — Progress Notes (Signed)
Subjective:   By signing my name below, I, Mikayla Hicks, attest that this documentation has been prepared under the direction and in the presence of Debbrah Alar, NP. 07/25/2022   Patient ID: Mikayla Hicks, female    DOB: [birth year], 37 y.o.   MRN: [id number]  Chief Complaint  Patient presents with   Transitions Of Care    HPI Patient is in today for a transfer of care visit.   Mood: Her mood has changed since she given birth to her son. She either feels very frustrated or on the verge of tears. She notes that her mother was holding her son and humming and she felt anger due to annoyance of the humming. She does not sleep well at night. She has tried Wellbutrin in the past but stopped due to losing clumps in her hair. Otherwise she found the Wellbutrin was effective outside of the side effects. She continues taking Vyvanse in the but finds it was not as effective. She has also tried sertraline in the past but found she was losing clumps of hair but is was not as severe as Wellbutrin. She was 1 year post-partem when she started taking it. She is not longer taking Buspar due to it not being effective.   ADHD: She is currently taking 20 mg Vyvanse to manage her ADHD and reports no new issues while taking it. She has tried concerta in the past before switching. She is also requesting a refill for 20 mg Vyvanse.   IUD: She does not use birth control. She is not currently sexually active. She is looking into getting an IUD. She is not seeing a GYN specialist and is interested in a referral to see one.   Family medical history: She has 1 sister who is healthy. Both her parents are living. Her maternal grandfather passed away from a heart attack. Her meternal grandmother passed away at age 70 from type 2 diabetes and CHF. Her paternal grandmother has a history of alzheimer's and her paternal grandfather has a history of macular degeneration. Her great aunt and first maternal cousin have a  history of breast cancer.   Social history: She rarely drinks alcohol. She does not use drugs.     Past Medical History:  Diagnosis Date   Allergy    Anxiety    just recently and seeing therapist.   Breech presentation 12/14/2020   Cesarean delivery - Breech, PPROM, 8w1dlabor 02/03/2021   Hemangioma vertebral column    Migraines    Preterm premature rupture of membranes (PPROM) 11/29/2020 11/29/2020    Past Surgical History:  Procedure Laterality Date   BACK SURGERY     CESAREAN SECTION N/A 02/03/2021   Procedure: Primary CESAREAN SECTION;  Surgeon: MAzucena Fallen MD;  [facility];  Service: Obstetrics;  Laterality: N/A;  EDD: 03/25/21    Family History  Problem Relation Age of Onset   Healthy Mother    Healthy Father    Diabetes Mellitus II Maternal Grandmother    Heart failure Maternal Grandmother    Heart attack Maternal Grandfather    Alzheimer's disease Paternal Grandmother    Macular degeneration Paternal Grandfather    Breast cancer Cousin        breast cancer age 822 maternal    Social History   Socioeconomic History   Marital status: Divorced    Spouse name: Not on file   Number of children: Not on file   Years of education: Not on file  Highest education level: Not on file  Occupational History   Not on file  Tobacco Use   Smoking status: Never   Smokeless tobacco: Never  Vaping Use   Vaping Use: Never used  Substance and Sexual Activity   Alcohol use: Yes    Comment: rare and if does very little.   Drug use: No   Sexual activity: Not Currently    Birth control/protection: None  Other Topics Concern   Not on file  Social History Narrative   Single   Lives with her son   Works as a Statistician   Enjoys spending time with her son, gym, reading   Social Determinants of Health   Financial Resource Strain: Not on file  Food Insecurity: Not on file  Transportation Needs: Not on file  Physical Activity: Not on file   Stress: Not on file  Social Connections: Not on file  Intimate Partner Violence: Not on file    Outpatient Medications Prior to Visit  Medication Sig Dispense Refill   b complex vitamins capsule Take 1 capsule by mouth daily.     cetirizine (ZYRTEC) 10 MG chewable tablet Chew 10 mg by mouth daily.     cholecalciferol (VITAMIN D3) 25 MCG (1000 UNIT) tablet Take 1,000 Units by mouth daily.     busPIRone (BUSPAR) 7.5 MG tablet Take 1 tablet (7.5 mg total) by mouth 2 (two) times daily. 60 tablet 3   lisdexamfetamine (VYVANSE) 20 MG capsule Take 1 capsule (20 mg total) by mouth daily. 30 capsule 0   ibuprofen (ADVIL) 600 MG tablet Take 1 tablet (600 mg total) by mouth every 6 (six) hours. 30 tablet 0   Omega-3 Fatty Acids (FISH OIL) 1000 MG CAPS Take by mouth.     No facility-administered medications prior to visit.    No Known Allergies  Review of Systems  Psychiatric/Behavioral:  Positive for depression.        Objective:    Physical Exam Constitutional:      General: She is not in acute distress.    Appearance: Normal appearance. She is not ill-appearing.  HENT:     Head: Normocephalic and atraumatic.     Right Ear: External ear normal.     Left Ear: External ear normal.  Eyes:     Extraocular Movements: Extraocular movements intact.     Pupils: Pupils are equal, round, and reactive to light.  Cardiovascular:     Rate and Rhythm: Normal rate and regular rhythm.     Pulses: Normal pulses.     Heart sounds: Normal heart sounds. No murmur heard.    No gallop.  Pulmonary:     Effort: Pulmonary effort is normal. No respiratory distress.     Breath sounds: Normal breath sounds. No wheezing or rales.  Skin:    General: Skin is warm and dry.  Neurological:     Mental Status: She is alert and oriented to person, place, and time.  Psychiatric:        Mood and Affect: Affect is tearful.        Judgment: Judgment normal.     BP 126/86 (BP Location: Right Arm, Patient  Position: Sitting, Cuff Size: Large)   Pulse 75   Temp 98 F (36.7 C) (Oral)   Resp 16   Wt 260 lb (117.9 kg)   SpO2 99%   BMI 37.31 kg/m  Wt Readings from Last 3 Encounters:  07/25/22 260 lb (117.9 kg)  01/15/22 257 lb 12.8  oz (116.9 kg)  01/28/21 237 lb 1 oz (107.5 kg)       Assessment & Plan:  Encounter for contraceptive management, unspecified type Assessment & Plan: She is interested in IUD placement. Will refer to GYN.   Orders: -     Ambulatory referral to Obstetrics / Gynecology  Depression, unspecified depression type Assessment & Plan: Uncontrolled. Will give trial of lexapro 10 mg.  Begin 5 mg nightly x 1 week, then increase to a full tab nightly on week two.     Anxiety Assessment & Plan: Symptoms seem to be more depression at this point but lexapro should be helpful for both.   Attention deficit hyperactivity disorder (ADHD), unspecified ADHD type Assessment & Plan: Uncontrolled. Will restart vyvanse. If still uncontrolled next visit after we get lexapro on board, we can adjust at that time.    Other orders -     Escitalopram Oxalate; Take 1/2 tablet (5 mg total) by mouth daily for 7 days, THEN 1 tablet (10 mg total) daily starting on week 2.  Dispense: 30 tablet; Refill: 0 -     Lisdexamfetamine Dimesylate; Take 1 capsule (20 mg total) by mouth daily.  Dispense: 30 capsule; Refill: 0    I, Nance Pear, NP, personally preformed the services described in this documentation.  All medical record entries made by the scribe were at my direction and in my presence.  I have reviewed the chart and discharge instructions (if applicable) and agree that the record reflects my personal performance and is accurate and complete. 07/25/2022   I,Mikayla Hicks,acting as a scribe for Nance Pear, NP.,have documented all relevant documentation on the behalf of Nance Pear, NP,as directed by  Nance Pear, NP while in the presence of Nance Pear, NP.   Nance Pear, NP

## 2022-08-15 ENCOUNTER — Ambulatory Visit: Payer: Medicaid Other | Admitting: Family

## 2022-08-15 ENCOUNTER — Other Ambulatory Visit (HOSPITAL_COMMUNITY): Payer: Self-pay

## 2022-08-15 VITALS — BP 116/66 | HR 63 | Temp 98.6°F | Resp 16 | Wt 253.0 lb

## 2022-08-15 DIAGNOSIS — F419 Anxiety disorder, unspecified: Secondary | ICD-10-CM

## 2022-08-15 DIAGNOSIS — F909 Attention-deficit hyperactivity disorder, unspecified type: Secondary | ICD-10-CM | POA: Diagnosis not present

## 2022-08-15 MED ORDER — ESCITALOPRAM OXALATE 10 MG PO TABS
10.0000 mg | ORAL_TABLET | Freq: Every day | ORAL | 1 refills | Status: DC
  Filled 2022-08-15 – 2022-08-27 (×2): qty 90, 90d supply, fill #0

## 2022-08-15 NOTE — Assessment & Plan Note (Signed)
Fair control on vyvanse. We discussed giving her a bit longer on the lexapro. If impulsivity continues to be an issue next visit will consider increasing vyvanse from '20mg'$  to '30mg'$ .

## 2022-08-15 NOTE — Progress Notes (Signed)
Subjective:     Patient ID: Mikayla Hicks, female    DOB: 14-Mar-1986, 37 y.o.   MRN: [id number]  Chief Complaint  Patient presents with   Depression    Here for follow up    Depression        Patient is in today for follow up of her depression. Last visit she noted irritability, anger, tearfulness and feeling overwhelmed. We gave her a trial of lexapro. Today she reports feeling "so much better." States that she is in "such a better headspace." The only side effect that she notes is sleepiness so she takes the lexapro at bedtime and she has been sleeping well.   Wt Readings from Last 3 Encounters:  08/15/22 253 lb (114.8 kg)  07/25/22 260 lb (117.9 kg)  01/15/22 257 lb 12.8 oz (116.9 kg)     She continues her vyvanse at '20mg'$  and notes that it is still helpful for her focus but she sometimes will have some impulsivity.    Health Maintenance Due  Topic Date Due   COVID-19 Vaccine (1) Never done   Hepatitis C Screening  Never done   DTaP/Tdap/Td (1 - Tdap) Never done   PAP SMEAR-Modifier  Never done    Past Medical History:  Diagnosis Date   Allergy    Anxiety    just recently and seeing therapist.   Breech presentation 12/14/2020   Cesarean delivery - Breech, PPROM, 7w1dlabor 02/03/2021   Hemangioma vertebral column    Migraines    Preterm premature rupture of membranes (PPROM) 11/29/2020 11/29/2020    Past Surgical History:  Procedure Laterality Date   BACK SURGERY     CESAREAN SECTION N/A 02/03/2021   Procedure: Primary CESAREAN SECTION;  Surgeon: MAzucena Fallen MD;  [facility];  Service: Obstetrics;  Laterality: N/A;  EDD: 03/25/21    Family History  Problem Relation Age of Onset   Healthy Mother    Healthy Father    Diabetes Mellitus II Maternal Grandmother    Heart failure Maternal Grandmother    Heart attack Maternal Grandfather    Alzheimer's disease Paternal Grandmother    Macular degeneration Paternal Grandfather    Breast cancer  Cousin        breast cancer age 35 maternal    Social History   Socioeconomic History   Marital status: Divorced    Spouse name: Not on file   Number of children: Not on file   Years of education: Not on file   Highest education level: Not on file  Occupational History   Not on file  Tobacco Use   Smoking status: Never   Smokeless tobacco: Never  Vaping Use   Vaping Use: Never used  Substance and Sexual Activity   Alcohol use: Yes    Comment: rare and if does very little.   Drug use: No   Sexual activity: Not Currently    Birth control/protection: None  Other Topics Concern   Not on file  Social History Narrative   Single   Lives with her son   Works as a rStatistician  Enjoys spending time with her son, gym, reading   Social Determinants of Health   Financial Resource Strain: Not on file  Food Insecurity: Not on file  Transportation Needs: Not on file  Physical Activity: Not on file  Stress: Not on file  Social Connections: Not on file  Intimate Partner Violence: Not on file    Outpatient Medications Prior  to Visit  Medication Sig Dispense Refill   b complex vitamins capsule Take 1 capsule by mouth daily.     cetirizine (ZYRTEC) 10 MG chewable tablet Chew 10 mg by mouth daily.     cholecalciferol (VITAMIN D3) 25 MCG (1000 UNIT) tablet Take 1,000 Units by mouth daily.     lisdexamfetamine (VYVANSE) 20 MG capsule Take 1 capsule (20 mg total) by mouth daily. 30 capsule 0   escitalopram (LEXAPRO) 10 MG tablet Take 1/2 tablet (5 mg total) by mouth daily for 7 days, THEN 1 tablet (10 mg total) daily starting on week 2. 30 tablet 0   No facility-administered medications prior to visit.    No Known Allergies  Review of Systems  Psychiatric/Behavioral:  Positive for depression.     See HPI     Objective:    Physical Exam Constitutional:      Appearance: Normal appearance.  Pulmonary:     Effort: Pulmonary effort is normal.  Neurological:      Mental Status: She is alert and oriented to person, place, and time.  Psychiatric:        Mood and Affect: Mood normal.        Behavior: Behavior normal.        Thought Content: Thought content normal.        Judgment: Judgment normal.     BP 116/66 (BP Location: Right Arm, Patient Position: Sitting, Cuff Size: Large)   Pulse 63   Temp 98.6 F (37 C) (Oral)   Resp 16   Wt 253 lb (114.8 kg)   SpO2 99%   BMI 36.30 kg/m  Wt Readings from Last 3 Encounters:  08/15/22 253 lb (114.8 kg)  07/25/22 260 lb (117.9 kg)  01/15/22 257 lb 12.8 oz (116.9 kg)       Assessment & Plan:   Problem List Items Addressed This Visit       Unprioritized   Attention deficit hyperactivity disorder (ADHD)    Fair control on vyvanse. We discussed giving her a bit longer on the lexapro. If impulsivity continues to be an issue next visit will consider increasing vyvanse from '20mg'$  to '30mg'$ .       Anxiety - Primary    Much improved. Continue lexapro '10mg'$ . Weight is actually down 7 pounds since last visit.       Relevant Medications   escitalopram (LEXAPRO) 10 MG tablet    I have changed Mikayla Hicks's escitalopram. I am also having her maintain her cetirizine, b complex vitamins, cholecalciferol, and lisdexamfetamine.  Meds ordered this encounter  Medications   escitalopram (LEXAPRO) 10 MG tablet    Sig: Take 1 tablet (10 mg total) by mouth daily.    Dispense:  90 tablet    Refill:  1    Order Specific Question:   Supervising Provider    Answer:   Penni Homans A [[id number]]

## 2022-08-15 NOTE — Assessment & Plan Note (Signed)
Much improved. Continue lexapro '10mg'$ . Weight is actually down 7 pounds since last visit.

## 2022-08-27 ENCOUNTER — Other Ambulatory Visit (HOSPITAL_COMMUNITY): Payer: Self-pay

## 2022-08-27 ENCOUNTER — Other Ambulatory Visit: Payer: Self-pay

## 2022-09-14 ENCOUNTER — Other Ambulatory Visit (HOSPITAL_COMMUNITY): Payer: Self-pay

## 2022-09-14 ENCOUNTER — Other Ambulatory Visit: Payer: Self-pay | Admitting: Family

## 2022-09-14 ENCOUNTER — Telehealth: Payer: Self-pay | Admitting: Family

## 2022-09-14 MED ORDER — LISDEXAMFETAMINE DIMESYLATE 20 MG PO CAPS
20.0000 mg | ORAL_CAPSULE | Freq: Every day | ORAL | 0 refills | Status: DC
  Filled 2022-09-14: qty 30, 30d supply, fill #0

## 2022-09-14 NOTE — Telephone Encounter (Signed)
Medication: lisdexamfetamine (VYVANSE) 20 MG  Has the patient contacted their pharmacy? Yes.     Preferred Pharmacy:  [facility] [id number], [place] [address] Phone: [phone]  Fax: [phone]

## 2022-09-17 ENCOUNTER — Other Ambulatory Visit (HOSPITAL_COMMUNITY): Payer: Self-pay

## 2022-09-27 ENCOUNTER — Encounter: Payer: Self-pay | Admitting: Family

## 2022-10-02 ENCOUNTER — Telehealth: Payer: Medicaid Other | Admitting: Family

## 2022-10-02 DIAGNOSIS — F419 Anxiety disorder, unspecified: Secondary | ICD-10-CM | POA: Diagnosis not present

## 2022-10-02 DIAGNOSIS — F909 Attention-deficit hyperactivity disorder, unspecified type: Secondary | ICD-10-CM

## 2022-10-02 MED ORDER — ESCITALOPRAM OXALATE 10 MG PO TABS: 10.0000 mg | ORAL_TABLET | Freq: Every day | ORAL | 1 refills | Status: DC

## 2022-10-02 MED ORDER — LISDEXAMFETAMINE DIMESYLATE 30 MG PO CAPS: 30.0000 mg | ORAL_CAPSULE | Freq: Every day | ORAL | 0 refills | Status: DC

## 2022-10-02 NOTE — Assessment & Plan Note (Signed)
Uncontrolled. Will increase vyvanse from  to .

## 2022-10-02 NOTE — Progress Notes (Signed)
MyChart Video Visit    Virtual Visit via Video Note   Patient location: Patient and provider in visit Provider location: Office  I discussed the limitations of evaluation and management by telemedicine and the availability of in person appointments. The patient expressed understanding and agreed to proceed.  Visit Date: 10/02/2022  Today's healthcare provider: Lemont Fillers, NP     Subjective:    Patient ID: Mikayla Hicks, female    DOB: July 20, 1985, 37 y.o.   MRN: [id number]  No chief complaint on file.   HPI Patient is in today for a telehealth video visit. On 09/27/22 she messaged the office reporting that her Lexapro tablets had changed when she obtained her refill. Since then Lexapro didn't seem to be as effective. She was also concerned that her Vyvanse was ineffective as well.  Mood:  In the past 2-3 weeks she has noticed increased and worsening impulsivity, forgetfulness, and procrastination. She sometimes struggles with not wanting to complete simple tasks. A few times she has experienced anxiety that she describes as feeling like a "bubble in her gut."  Lexapro:  Instead of a circular tablet, her Lexapro is now an oblong tablet. Since this change she states that Lexapro is not working the way it was. She notes that her friends have also noticed that it doesn't seem as effective for them either. She may try to fill her prescription at a different pharmacy  Sleepiness:  Since her last visit, she confirms that her sleepiness side effect has cleared up a bit.  Weight:  She reports maintained weight at home. Her impulsivity is often associated with her diet. Wt Readings from Last 3 Encounters:  08/15/22 253 lb (114.8 kg)  07/25/22 260 lb (117.9 kg)  01/15/22 257 lb 12.8 oz (116.9 kg)     Past Medical History:  Diagnosis Date   Allergy    Anxiety    just recently and seeing therapist.   Breech presentation 12/14/2020   Cesarean delivery - Breech, PPROM,  [redacted]w[redacted]d labor 02/03/2021   Hemangioma vertebral column    Migraines    Preterm premature rupture of membranes (PPROM) 11/29/2020 11/29/2020    Past Surgical History:  Procedure Laterality Date   BACK SURGERY     CESAREAN SECTION N/A 02/03/2021   Procedure: Primary CESAREAN SECTION;  Surgeon: Shea Evans, MD;  Location: [facility];  Service: Obstetrics;  Laterality: N/A;  EDD: 03/25/21    Family History  Problem Relation Age of Onset   Healthy Mother    Healthy Father    Diabetes Mellitus II Maternal Grandmother    Heart failure Maternal Grandmother    Heart attack Maternal Grandfather    Alzheimer's disease Paternal Grandmother    Macular degeneration Paternal Grandfather    Breast cancer Cousin        breast cancer age 35, maternal    Social History   Socioeconomic History   Marital status: Divorced    Spouse name: Not on file   Number of children: Not on file   Years of education: Not on file   Highest education level: Not on file  Occupational History   Not on file  Tobacco Use   Smoking status: Never   Smokeless tobacco: Never  Vaping Use   Vaping Use: Never used  Substance and Sexual Activity   Alcohol use: Yes    Comment: rare and if does very little.   Drug use: No   Sexual activity: Not Currently  Birth control/protection: None  Other Topics Concern   Not on file  Social History Narrative   Single   Lives with her son   Works as a Buyer, retail   Enjoys spending time with her son, gym, reading   Social Determinants of Health   Financial Resource Strain: Not on file  Food Insecurity: Not on file  Transportation Needs: Not on file  Physical Activity: Not on file  Stress: Not on file  Social Connections: Not on file  Intimate Partner Violence: Not on file    Outpatient Medications Prior to Visit  Medication Sig Dispense Refill   b complex vitamins capsule Take 1 capsule by mouth daily.     cetirizine (ZYRTEC) 10 MG chewable tablet Chew  10 mg by mouth daily.     cholecalciferol (VITAMIN D3) 25 MCG (1000 UNIT) tablet Take 1,000 Units by mouth daily.     escitalopram (LEXAPRO) 10 MG tablet Take 1 tablet (10 mg total) by mouth daily. 90 tablet 1   lisdexamfetamine (VYVANSE) 20 MG capsule Take 1 capsule (20 mg total) by mouth daily. 30 capsule 0   No facility-administered medications prior to visit.    No Known Allergies  ROS   See HPI.     Objective:     Physical Exam: Gen: Awake, alert, no acute distress. Resp: Breathing is even and non-labored. Psych: calm/pleasant demeanor. Neuro: Alert and Oriented x3, + facial symmetry, speech is clear.  There were no vitals taken for this visit. Wt Readings from Last 3 Encounters:  08/15/22 253 lb (114.8 kg)  07/25/22 260 lb (117.9 kg)  01/15/22 257 lb 12.8 oz (116.9 kg)       Assessment & Plan:   Problem List Items Addressed This Visit       Unprioritized   Attention deficit hyperactivity disorder (ADHD) - Primary    Uncontrolled. Will increase vyvanse from  to .       Anxiety    She felt that a different generic form worked better for her and [facility]       Relevant Medications   escitalopram (LEXAPRO) 10 MG tablet     Meds ordered this encounter  Medications   lisdexamfetamine (VYVANSE) 30 MG capsule    Sig: Take 1 capsule (30 mg total) by mouth daily.    Dispense:  30 capsule    Refill:  0    Order Specific Question:   Supervising Provider    Answer:   Danise Edge A [[id number]]   escitalopram (LEXAPRO) 10 MG tablet    Sig: Take 1 tablet (10 mg total) by mouth daily.    Dispense:  90 tablet    Refill:  1    Order Specific Question:   Supervising Provider    Answer:   Danise Edge A [[id number]]    I discussed the assessment and treatment plan with the patient. The patient was provided an opportunity to ask questions and all were answered. The patient agreed with the plan and demonstrated an  understanding of the instructions.   The patient was advised to call back or seek an in-person evaluation if the symptoms worsen or if the condition fails to improve as anticipated.   I,Mathew Stumpf,acting as a Neurosurgeon for [employer], NP.,have documented all relevant documentation on the behalf of Lemont Fillers, NP,as directed by  Lemont Fillers, NP while in the presence of Lemont Fillers,  NP.   I, Lemont Fillers, NP, personally preformed the services described in this documentation.  All medical record entries made by the scribe were at my direction and in my presence.  I have reviewed the chart and discharge instructions (if applicable) and agree that the record reflects my personal performance and is accurate and complete. 10/02/2022.  Lemont Fillers, NP [facility] at [facility] [phone] (phone) [phone] (fax)  [facility]

## 2022-10-02 NOTE — Assessment & Plan Note (Signed)
She felt that a different generic form worked better for her and [facility]

## 2022-10-24 NOTE — Telephone Encounter (Signed)
Can you please cancel [facility] at [facility]?

## 2022-10-24 NOTE — Telephone Encounter (Signed)
Rx was cancelled 

## 2022-10-26 ENCOUNTER — Other Ambulatory Visit (HOSPITAL_COMMUNITY): Payer: Self-pay

## 2022-10-26 ENCOUNTER — Other Ambulatory Visit: Payer: Self-pay | Admitting: Family

## 2022-11-01 ENCOUNTER — Other Ambulatory Visit (HOSPITAL_COMMUNITY): Payer: Self-pay

## 2022-11-07 ENCOUNTER — Other Ambulatory Visit (HOSPITAL_COMMUNITY): Payer: Self-pay

## 2022-11-14 ENCOUNTER — Other Ambulatory Visit (HOSPITAL_COMMUNITY): Payer: Self-pay

## 2022-11-16 ENCOUNTER — Ambulatory Visit: Payer: Medicaid Other | Admitting: Family

## 2022-11-16 ENCOUNTER — Other Ambulatory Visit (HOSPITAL_BASED_OUTPATIENT_CLINIC_OR_DEPARTMENT_OTHER): Payer: Self-pay

## 2022-11-16 VITALS — BP 121/76 | HR 77 | Temp 97.8°F | Resp 12 | Ht 70.0 in | Wt 255.2 lb

## 2022-11-16 DIAGNOSIS — F909 Attention-deficit hyperactivity disorder, unspecified type: Secondary | ICD-10-CM

## 2022-11-16 DIAGNOSIS — F419 Anxiety disorder, unspecified: Secondary | ICD-10-CM | POA: Diagnosis not present

## 2022-11-16 MED ORDER — LISDEXAMFETAMINE DIMESYLATE 30 MG PO CAPS
30.0000 mg | ORAL_CAPSULE | Freq: Every day | ORAL | 0 refills | Status: DC
  Filled 2022-11-16: qty 30, 30d supply, fill #0

## 2022-11-16 NOTE — Progress Notes (Signed)
Subjective:   By signing my name below, I, Isabelle Course, attest that this documentation has been prepared under the direction and in the presence of Lemont Fillers, NP 11/16/22   Patient ID: Mikayla Hicks, female    DOB: [birth year], 37 y.o.   MRN: [id number]  Chief Complaint  Patient presents with   3 month follow up    HPI Patient is in today for a 3 month follow up.   Mood: Her Vyvanse was increased from 20 mg to 30 mg at her last visit.  She reports she had difficulty getting her increased dose at the pharmacy and was has not started it. She has not refilled her generic Lexapro yet, but states she should be able to get it in about 2 weeks. She has been doing overall well on her current Lexapro.   Past Medical History:  Diagnosis Date   Allergy    Anxiety    just recently and seeing therapist.   Breech presentation 12/14/2020   Cesarean delivery - Breech, PPROM, [redacted]w[redacted]d labor 02/03/2021   Hemangioma vertebral column    Migraines    Preterm premature rupture of membranes (PPROM) 11/29/2020 11/29/2020    Past Surgical History:  Procedure Laterality Date   BACK SURGERY     CESAREAN SECTION N/A 02/03/2021   Procedure: Primary CESAREAN SECTION;  Surgeon: Shea Evans, MD;  Location: [facility] LD [facility]S;  Service: Obstetrics;  Laterality: N/A;  EDD: 03/25/21    Family History  Problem Relation Age of Onset   Healthy Mother    Healthy Father    Diabetes Mellitus II Maternal Grandmother    Heart failure Maternal Grandmother    Heart attack Maternal Grandfather    Alzheimer's disease Paternal Grandmother    Macular degeneration Paternal Grandfather    Breast cancer Cousin        breast cancer age 37, maternal    Social History   Socioeconomic History   Marital status: Divorced    Spouse name: Not on file   Number of children: Not on file   Years of education: Not on file   Highest education level: Not on file  Occupational History   Not on file  Tobacco Use    Smoking status: Never   Smokeless tobacco: Never  Vaping Use   Vaping Use: Never used  Substance and Sexual Activity   Alcohol use: Yes    Comment: rare and if does very little.   Drug use: No   Sexual activity: Not Currently    Birth control/protection: None  Other Topics Concern   Not on file  Social History Narrative   Single   Lives with her son   Works as a Buyer, retail   Enjoys spending time with her son, gym, reading   Social Determinants of Health   Financial Resource Strain: Not on file  Food Insecurity: Not on file  Transportation Needs: Not on file  Physical Activity: Not on file  Stress: Not on file  Social Connections: Not on file  Intimate Partner Violence: Not on file    Outpatient Medications Prior to Visit  Medication Sig Dispense Refill   b complex vitamins capsule Take 1 capsule by mouth daily.     cetirizine (ZYRTEC) 10 MG chewable tablet Chew 10 mg by mouth daily.     cholecalciferol (VITAMIN D3) 25 MCG (1000 UNIT) tablet Take 1,000 Units by mouth daily.     escitalopram (LEXAPRO) 10 MG tablet Take 1 tablet (10 mg  total) by mouth daily. 90 tablet 1   lisdexamfetamine (VYVANSE) 30 MG capsule Take 1 capsule (30 mg total) by mouth daily. 30 capsule 0   No facility-administered medications prior to visit.    No Known Allergies  ROS    See HPI Objective:    Physical Exam Constitutional:      Appearance: Normal appearance.  Cardiovascular:     Rate and Rhythm: Normal rate.     Heart sounds: Normal heart sounds.  Pulmonary:     Effort: Pulmonary effort is normal.     Breath sounds: Normal breath sounds.  Skin:    General: Skin is warm and dry.  Neurological:     General: No focal deficit present.     Mental Status: She is alert and oriented to person, place, and time.  Psychiatric:        Mood and Affect: Mood normal.        Behavior: Behavior normal.        Thought Content: Thought content normal.        Judgment: Judgment  normal.     BP 121/76 (BP Location: Right Arm, Cuff Size: Large)   Pulse 77   Temp 97.8 F (36.6 C) (Oral)   Resp 12   Ht 5\' 10"  (1.778 m)   Wt 255 lb 3.2 oz (115.8 kg)   SpO2 99%   BMI 36.62 kg/m  Wt Readings from Last 3 Encounters:  11/16/22 255 lb 3.2 oz (115.8 kg)  08/15/22 253 lb (114.8 kg)  07/25/22 260 lb (117.9 kg)       Assessment & Plan:  Depression, unspecified depression type  Attention deficit hyperactivity disorder (ADHD), unspecified ADHD type Assessment & Plan: Apparently her pharmacy stated that they did not receive the rx for 30 mg vyvanse that we sent last visit.  Rx was resent to the downstairs pharmacy.    Anxiety Assessment & Plan: She has not yet had the opportunity to refill her generic lexapro rx.  She will see if she can get her preferred generic when she refills in a few weeks.    Other orders -     Lisdexamfetamine Dimesylate; Take 1 capsule (30 mg total) by mouth daily.  Dispense: 30 capsule; Refill: 0     I,Rachel Rivera,acting as a scribe for Lemont Fillers, NP.,have documented all relevant documentation on the behalf of Lemont Fillers, NP,as directed by  Lemont Fillers, NP while in the presence of Lemont Fillers, NP.   I, Lemont Fillers, NP, personally preformed the services described in this documentation.  All medical record entries made by the scribe were at my direction and in my presence.  I have reviewed the chart and discharge instructions (if applicable) and agree that the record reflects my personal performance and is accurate and complete. 11/16/22   Lemont Fillers, NP

## 2022-11-16 NOTE — Assessment & Plan Note (Signed)
Apparently her pharmacy stated that they did not receive the rx for 30 mg vyvanse that we sent last visit.  Rx was resent to the downstairs pharmacy.

## 2022-11-16 NOTE — Assessment & Plan Note (Signed)
She has not yet had the opportunity to refill her generic lexapro rx.  She will see if she can get her preferred generic when she refills in a few weeks.

## 2022-11-16 NOTE — Assessment & Plan Note (Deleted)
She has not yet had the opportunity to refill her generic lexapro rx.  She will see if she can get her preferred generic when she refills in a few weeks.

## 2022-12-29 ENCOUNTER — Encounter: Payer: Self-pay | Admitting: Family

## 2022-12-30 ENCOUNTER — Other Ambulatory Visit: Payer: Self-pay | Admitting: Family

## 2022-12-31 ENCOUNTER — Other Ambulatory Visit (HOSPITAL_COMMUNITY): Payer: Self-pay

## 2022-12-31 MED ORDER — LISDEXAMFETAMINE DIMESYLATE 30 MG PO CAPS
30.0000 mg | ORAL_CAPSULE | Freq: Every day | ORAL | 0 refills | Status: DC
  Filled 2022-12-31: qty 30, 30d supply, fill #0

## 2023-01-08 IMAGING — US US OB < 14 WEEKS - US OB TV
1 series · 13 of 28 positions shown · non-contrast
Comparison: None.

CLINICAL DATA: Vaginal bleeding and pelvic cramping in early
pregnancy

EXAM:
OBSTETRIC <14 WK US AND TRANSVAGINAL OB US
TECHNIQUE: Both transabdominal and transvaginal ultrasound examinations were
performed for complete evaluation of the gestation as well as the
maternal uterus, adnexal regions, and pelvic cul-de-sac.
Transvaginal technique was performed to assess early pregnancy.

[Series 1: us ob < 14 weeks - us ob tv · 13 of 84 slices shown]
[im 4/84]
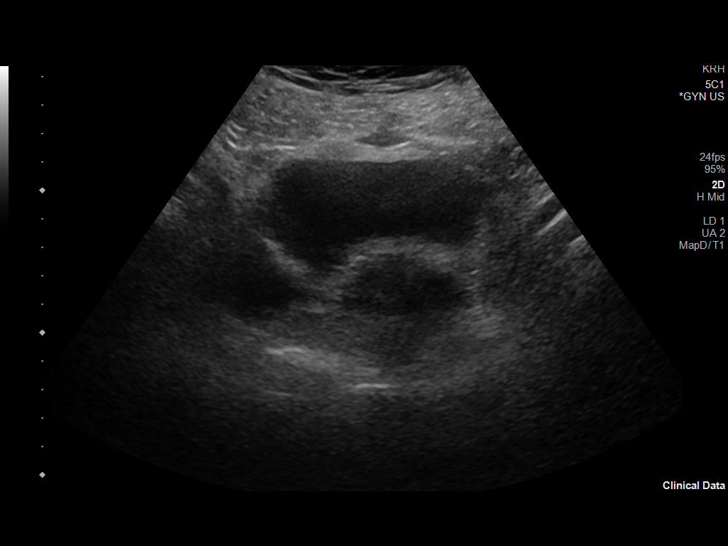
[im 10/84]
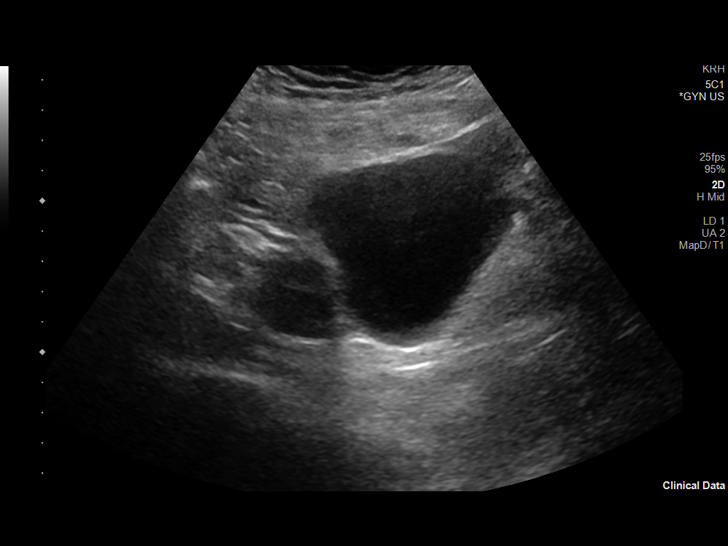
[im 16/84]
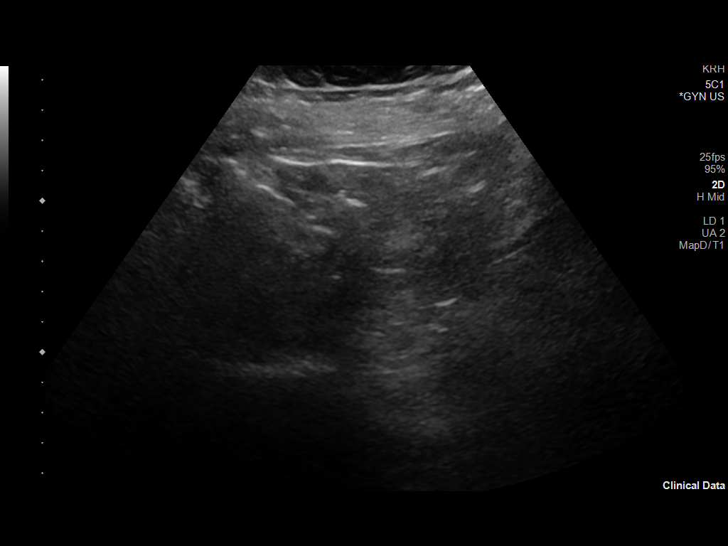
[im 22/84]
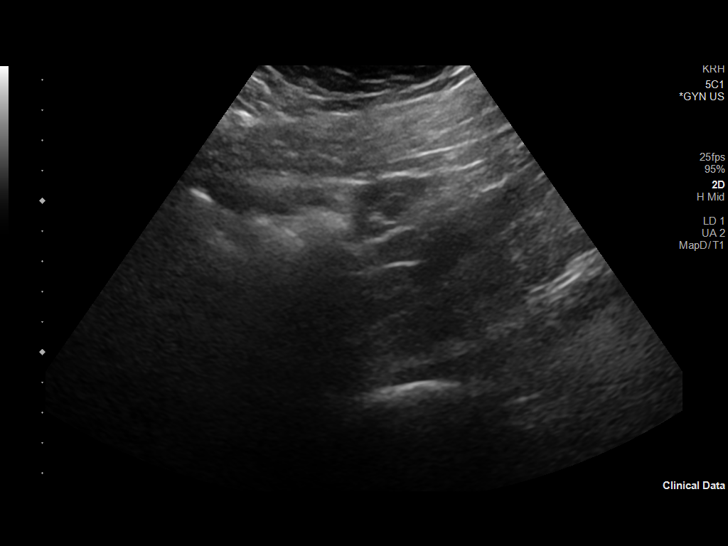
[im 28/84]
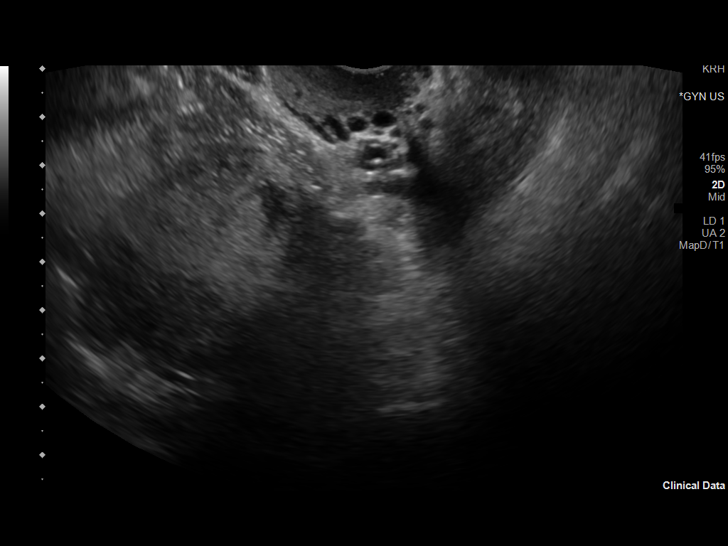
[im 34/84]
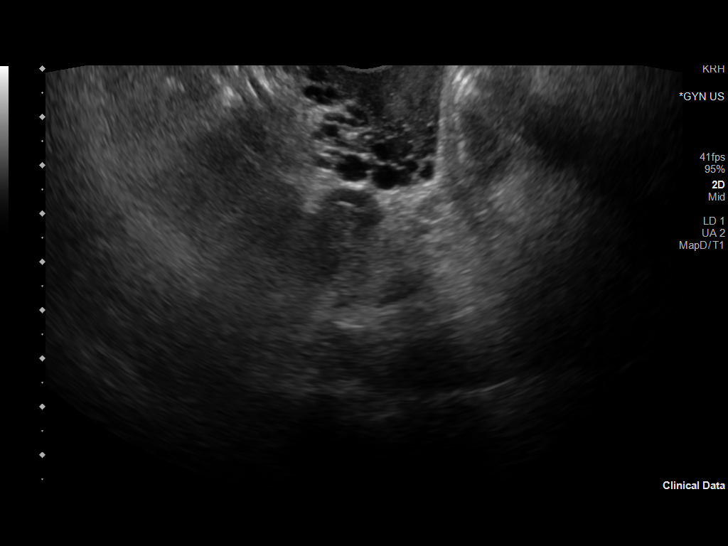
[im 44/84]
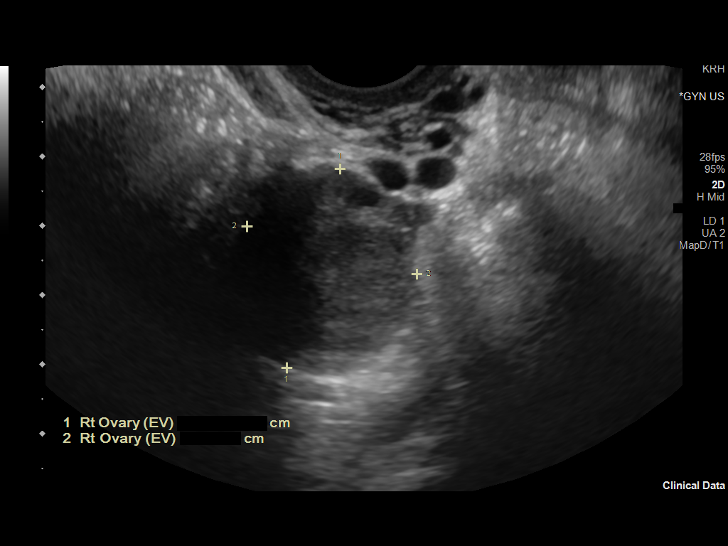
[im 50/84]
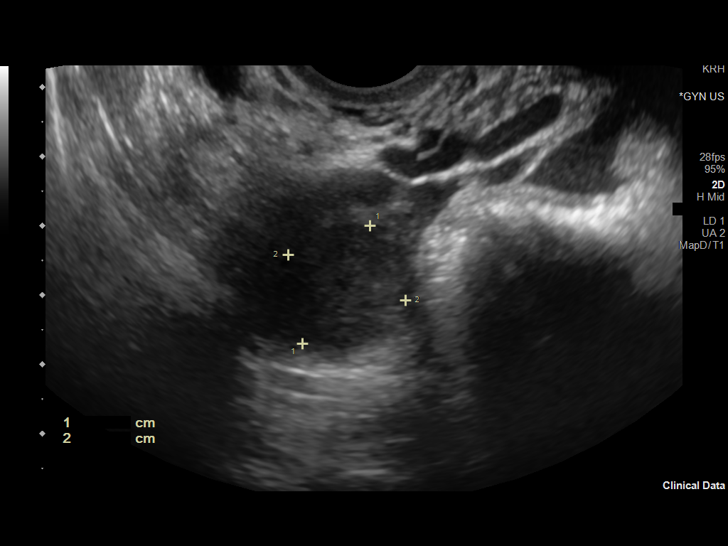
[im 56/84]
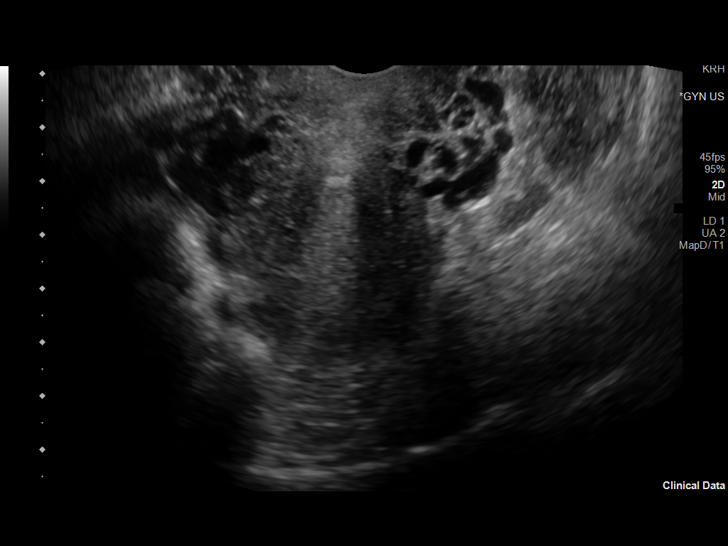
[im 62/84]
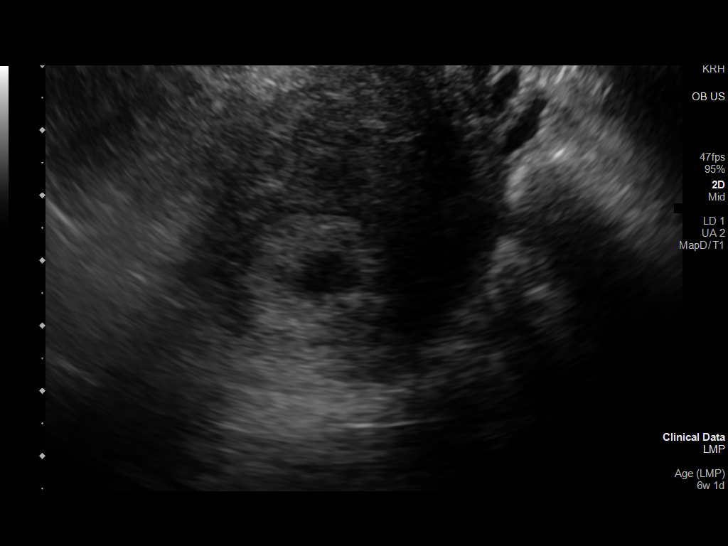
[im 68/84]
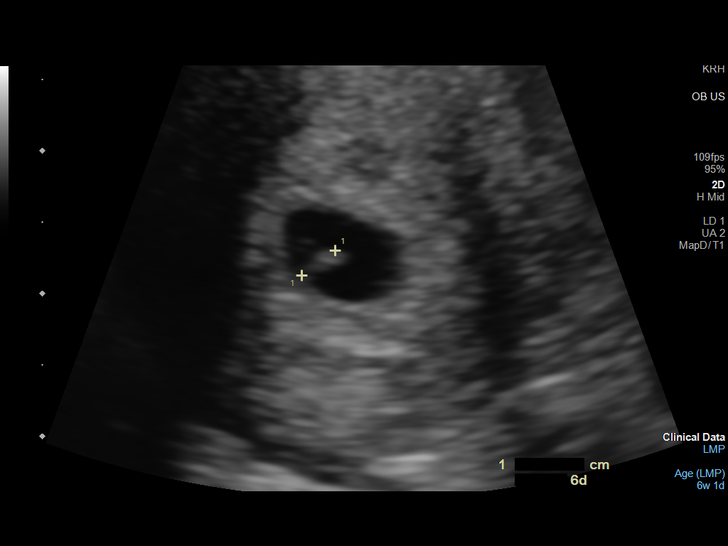
[im 74/84]
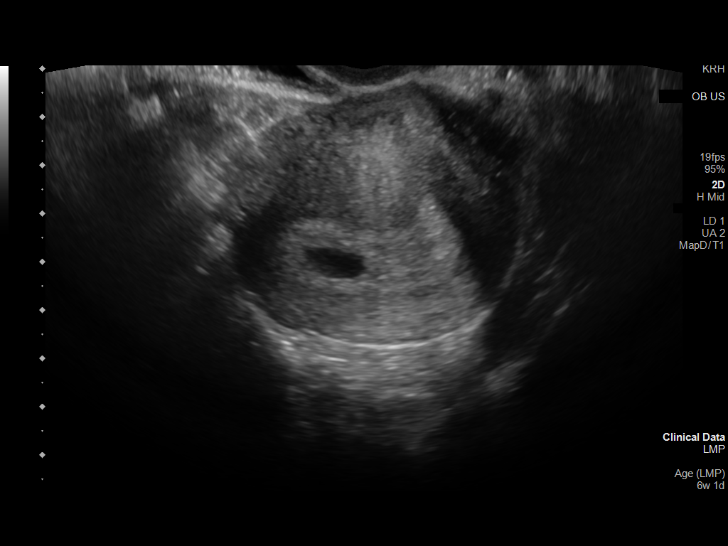
[im 80/84]
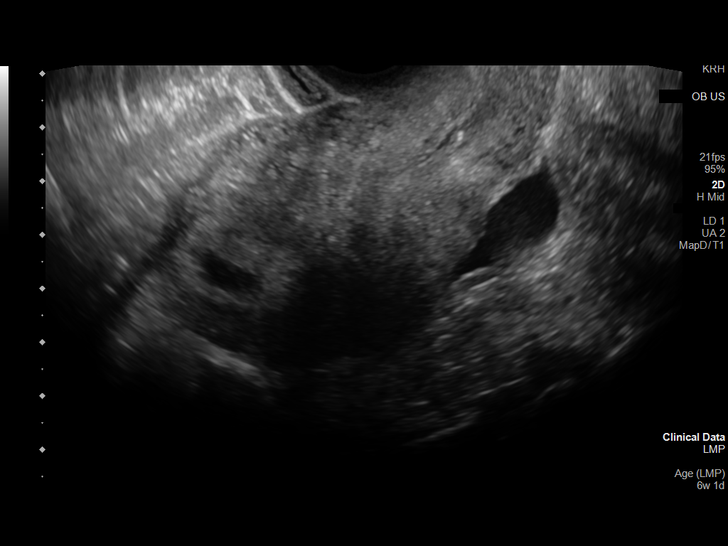

[13 of 28 positions shown; findings below may reference images not displayed]

FINDINGS: Intrauterine gestational sac: Single

Yolk sac:  Visualized.

Embryo:  Visualized.

Cardiac Activity: Visualized.

Heart Rate: 168 bpm

MSD:   mm    w     d

CRL:  3.1 mm   5 w   6 d                  US EDC: 03/27/2021

Maternal uterus/adnexae:

Subchorionic hemorrhage: None

Right ovary: Right ovary corpus luteum noted.

Left ovary: Normal

Other :There is a suggestion of a bicornuate uterus with divergent
uterine horns.

Free fluid:  Trace free fluid within the pelvis.
IMPRESSION: 1. Single living intrauterine gestation is identified with estimated
gestational age of 5 weeks and 6 days.
2. Suggestion of a bicornuate uterus. If further imaging is
clinically indicated MRI of the pelvis may provide a more definitive
characterization of suspected mullerian duct anomaly.

## 2023-01-24 ENCOUNTER — Encounter (INDEPENDENT_AMBULATORY_CARE_PROVIDER_SITE_OTHER): Payer: Self-pay

## 2023-02-05 ENCOUNTER — Other Ambulatory Visit (HOSPITAL_COMMUNITY): Payer: Self-pay

## 2023-02-05 ENCOUNTER — Telehealth (INDEPENDENT_AMBULATORY_CARE_PROVIDER_SITE_OTHER): Payer: Medicaid Other | Admitting: Family

## 2023-02-05 DIAGNOSIS — F419 Anxiety disorder, unspecified: Secondary | ICD-10-CM

## 2023-02-05 DIAGNOSIS — Q6689 Other  specified congenital deformities of feet: Secondary | ICD-10-CM | POA: Diagnosis not present

## 2023-02-05 DIAGNOSIS — F909 Attention-deficit hyperactivity disorder, unspecified type: Secondary | ICD-10-CM

## 2023-02-05 MED ORDER — LISDEXAMFETAMINE DIMESYLATE 30 MG PO CAPS
30.0000 mg | ORAL_CAPSULE | Freq: Every day | ORAL | 0 refills | Status: DC
  Filled 2023-02-05 – 2023-02-15 (×2): qty 30, 30d supply, fill #0

## 2023-02-05 MED ORDER — ESCITALOPRAM OXALATE 10 MG PO TABS
15.0000 mg | ORAL_TABLET | Freq: Every day | ORAL | 1 refills | Status: DC
  Filled 2023-02-05: qty 135, 90d supply, fill #0
  Filled 2023-02-15: qty 5, 3d supply, fill #0
  Filled 2023-02-19: qty 135, 90d supply, fill #1
  Filled 2023-05-16: qty 130, 86d supply, fill #2

## 2023-02-05 NOTE — Progress Notes (Signed)
\   MyChart Video Visit    Virtual Visit via Video Note    Patient location: Home. Patient and provider in visit Provider location: Office  I discussed the limitations of evaluation and management by telemedicine and the availability of in person appointments. The patient expressed understanding and agreed to proceed.  Visit Date: 02/05/2023  Today's healthcare provider: Lemont Fillers, NP     Subjective:    Patient ID: Mikayla Hicks, female    DOB: February 18, 1986, 37 y.o.   MRN: [id number]  Chief Complaint  Patient presents with   ADHD    Follow up   Anxiety    Follow up    Anxiety      The patient, with a history of ADHD and anxiety, presents for medication management. She reports improved symptoms on Vyvanse 30mg  compared to 20mg , noting a significant difference in symptoms when she misses a dose. She requests a refill of Vyvanse 30mg .  She also reports that her current generic Lexapro, which is oval-shaped, is not as effective as the previous round-shaped generic. She experiences persistent anxiety symptoms, described as a 'gut feeling of being overwhelmed' and 'bubble gut fear anxiety type.' She requests a change in medication.  Additionally, the patient reports foot pain, specifically in the pinky toes of both feet which 'stick up' on the adjacent toes. She experiences swelling and discomfort, especially after walking for long periods or wearing flip-flops. The pain is described as 'walking on pins and needles.' She has been advised by a retired podiatrist that surgical correction may be necessary in the future.  Past Medical History:  Diagnosis Date   Allergy    Anxiety    just recently and seeing therapist.   Breech presentation 12/14/2020   Cesarean delivery - Breech, PPROM, [redacted]w[redacted]d labor 02/03/2021   Hemangioma vertebral column    Migraines    Preterm premature rupture of membranes (PPROM) 11/29/2020 11/29/2020    Past Surgical History:  Procedure  Laterality Date   BACK SURGERY     CESAREAN SECTION N/A 02/03/2021   Procedure: Primary CESAREAN SECTION;  Surgeon: Shea Evans, MD;  Location: [facility] LD [facility]S;  Service: Obstetrics;  Laterality: N/A;  EDD: 03/25/21    Family History  Problem Relation Age of Onset   Healthy Mother    Healthy Father    Diabetes Mellitus II Maternal Grandmother    Heart failure Maternal Grandmother    Heart attack Maternal Grandfather    Alzheimer's disease Paternal Grandmother    Macular degeneration Paternal Grandfather    Breast cancer Cousin        breast cancer age 18, maternal    Social History   Socioeconomic History   Marital status: Divorced    Spouse name: Not on file   Number of children: Not on file   Years of education: Not on file   Highest education level: Not on file  Occupational History   Not on file  Tobacco Use   Smoking status: Never   Smokeless tobacco: Never  Vaping Use   Vaping status: Never Used  Substance and Sexual Activity   Alcohol use: Yes    Comment: rare and if does very little.   Drug use: No   Sexual activity: Not Currently    Birth control/protection: None  Other Topics Concern   Not on file  Social History Narrative   Single   Lives with her son   Works as a Buyer, retail   Enjoys spending time with  her son, gym, reading   Social Determinants of Health   Financial Resource Strain: Not on file  Food Insecurity: Not on file  Transportation Needs: Not on file  Physical Activity: Not on file  Stress: Not on file  Social Connections: Not on file  Intimate Partner Violence: Not on file    Outpatient Medications Prior to Visit  Medication Sig Dispense Refill   cetirizine (ZYRTEC) 10 MG chewable tablet Chew 10 mg by mouth daily.     cholecalciferol (VITAMIN D3) 25 MCG (1000 UNIT) tablet Take 1,000 Units by mouth daily.     escitalopram (LEXAPRO) 10 MG tablet Take 1 tablet (10 mg total) by mouth daily. 90 tablet 1   lisdexamfetamine  (VYVANSE) 30 MG capsule Take 1 capsule (30 mg total) by mouth daily. 30 capsule 0   b complex vitamins capsule Take 1 capsule by mouth daily.     No facility-administered medications prior to visit.    No Known Allergies  ROS See HPI    Objective:    Physical Exam  There were no vitals taken for this visit. Wt Readings from Last 3 Encounters:  11/16/22 255 lb 3.2 oz (115.8 kg)  08/15/22 253 lb (114.8 kg)  07/25/22 260 lb (117.9 kg)    Gen: Awake, alert, no acute distress Resp: Breathing is even and non-labored Psych: calm/pleasant demeanor Neuro: Alert and Oriented x 3, + facial symmetry, speech is clear.     Assessment & Plan:   Problem List Items Addressed This Visit       Unprioritized   Clinodactyly of toe - Primary    Refer to Dr. Arlyce Dice at [facility] for surgical consultation per pt request.       Relevant Orders   AMB referral to orthopedics   Attention deficit hyperactivity disorder (ADHD)     Improved symptoms on increased dose of Vyvanse 30mg . Noted difference in symptoms when medication is missed. -Continue Vyvanse 30mg  daily. -Sent refill to [facility] pharmacy.      Anxiety     Persistent symptoms despite Lexapro. Currently on generic version with different shape, possibly indicating change in generic manufacturers. -Increase Lexapro to 15mg  daily. -Sent prescription to [facility] pharmacy.      Relevant Medications   escitalopram (LEXAPRO) 10 MG tablet    I have discontinued Azalee E. Nicasio's b complex vitamins. I have also changed her escitalopram. Additionally, I am having her maintain her cetirizine, cholecalciferol, and lisdexamfetamine.  Meds ordered this encounter  Medications   lisdexamfetamine (VYVANSE) 30 MG capsule    Sig: Take 1 capsule (30 mg total) by mouth daily.    Dispense:  30 capsule    Refill:  0    Order Specific Question:   Supervising Provider    Answer:   Danise Edge A [[id number]]   escitalopram (LEXAPRO) 10 MG tablet     Sig: Take 1.5 tablets (15 mg total) by mouth daily.    Dispense:  135 tablet    Refill:  1    Order Specific Question:   Supervising Provider    Answer:   Danise Edge A [[id number]]    I discussed the assessment and treatment plan with the patient. The patient was provided an opportunity to ask questions and all were answered. The patient agreed with the plan and demonstrated an understanding of the instructions.   The patient was advised to call back or seek an in-person evaluation if the symptoms worsen or if the condition fails to improve  as anticipated.  Lemont Fillers, NP [facility] at [facility] [phone] (phone) [phone] (fax)  [facility]

## 2023-02-05 NOTE — Assessment & Plan Note (Signed)
  Persistent symptoms despite Lexapro. Currently on generic version with different shape, possibly indicating change in generic manufacturers. -Increase Lexapro to 15mg  daily. -Sent prescription to [facility] pharmacy.

## 2023-02-05 NOTE — Assessment & Plan Note (Signed)
  Improved symptoms on increased dose of Vyvanse 30mg . Noted difference in symptoms when medication is missed. -Continue Vyvanse 30mg  daily. -Sent refill to [facility] pharmacy.

## 2023-02-05 NOTE — Assessment & Plan Note (Signed)
Refer to Dr. Arlyce Dice at [facility] for surgical consultation per pt request.

## 2023-02-15 ENCOUNTER — Other Ambulatory Visit (HOSPITAL_COMMUNITY): Payer: Self-pay

## 2023-02-18 ENCOUNTER — Other Ambulatory Visit (HOSPITAL_COMMUNITY): Payer: Self-pay

## 2023-02-18 ENCOUNTER — Telehealth: Payer: Self-pay | Admitting: Family

## 2023-02-18 NOTE — Telephone Encounter (Signed)
Pt called & stated that she received only 3 days worth of medication for escitalopram (LEXAPRO) 10 MG tablet . Pt is unsure why she can't receive the 90 day supply. Please call & advise pt.

## 2023-02-19 ENCOUNTER — Ambulatory Visit: Payer: Medicaid Other | Admitting: Family

## 2023-02-19 ENCOUNTER — Encounter (HOSPITAL_COMMUNITY): Payer: Self-pay

## 2023-02-19 ENCOUNTER — Other Ambulatory Visit (HOSPITAL_COMMUNITY): Payer: Self-pay

## 2023-02-19 NOTE — Telephone Encounter (Signed)
Per pharmacy PA was needed for 1.5 daily, the PA was completed. Patient advised rx ready for pick up

## 2023-04-02 ENCOUNTER — Other Ambulatory Visit: Payer: Self-pay | Admitting: Family

## 2023-04-02 ENCOUNTER — Other Ambulatory Visit (HOSPITAL_COMMUNITY): Payer: Self-pay

## 2023-04-02 MED ORDER — LISDEXAMFETAMINE DIMESYLATE 30 MG PO CAPS
30.0000 mg | ORAL_CAPSULE | Freq: Every day | ORAL | 0 refills | Status: DC
  Filled 2023-04-02: qty 30, 30d supply, fill #0

## 2023-04-02 NOTE — Telephone Encounter (Signed)
Please schedule patient for an in person visit so we can update her controlled substance contract.

## 2023-04-03 NOTE — Telephone Encounter (Signed)
Called patient but no answer, left voice mail for patinet to call back and schedule follow up appointment

## 2023-05-06 ENCOUNTER — Telehealth: Payer: Self-pay | Admitting: Family

## 2023-05-06 NOTE — Telephone Encounter (Signed)
I called patient to reschedule appointment because provider will be out of the office. Pt will need refill of vyvanse before her appt on 12/10. Please send to [facility] on [address].

## 2023-05-07 NOTE — Telephone Encounter (Signed)
Patient back on schedule for tomorrow

## 2023-05-08 ENCOUNTER — Ambulatory Visit: Payer: Medicaid Other | Admitting: Family

## 2023-05-08 ENCOUNTER — Other Ambulatory Visit (HOSPITAL_BASED_OUTPATIENT_CLINIC_OR_DEPARTMENT_OTHER): Payer: Self-pay

## 2023-05-08 ENCOUNTER — Encounter: Payer: Self-pay | Admitting: Family

## 2023-05-08 VITALS — BP 129/80 | HR 86 | Temp 97.9°F | Ht 71.0 in | Wt 266.2 lb

## 2023-05-08 DIAGNOSIS — F909 Attention-deficit hyperactivity disorder, unspecified type: Secondary | ICD-10-CM

## 2023-05-08 DIAGNOSIS — F32A Depression, unspecified: Secondary | ICD-10-CM | POA: Diagnosis not present

## 2023-05-08 DIAGNOSIS — F419 Anxiety disorder, unspecified: Secondary | ICD-10-CM

## 2023-05-08 MED ORDER — LISDEXAMFETAMINE DIMESYLATE 30 MG PO CAPS
30.0000 mg | ORAL_CAPSULE | Freq: Every day | ORAL | 0 refills | Status: DC
  Filled 2023-05-08: qty 30, 30d supply, fill #0

## 2023-05-09 NOTE — Progress Notes (Signed)
Subjective:     Patient ID: Mikayla Hicks, female    DOB: 09/23/1985, 37 y.o.   MRN: [id number]  Chief Complaint  Patient presents with   Follow-up    F/u on medications     HPI  Discussed the use of AI scribe software for clinical note transcription with the patient, who gave verbal consent to proceed.  History of Present Illness      Patient presents today for follow up.  ADHD- Overall she notes improvement on vyvanse.  She feels that her sleep deprivation from having a young child is a contributor to her decreased focus at times.  Depression- reports mood is stable on lexapro.          Health Maintenance Due  Topic Date Due   COVID-19 Vaccine (1) Never done   Hepatitis C Screening  Never done   DTaP/Tdap/Td (1 - Tdap) Never done   Cervical Cancer Screening (HPV/Pap Cotest)  Never done   INFLUENZA VACCINE  01/10/2023    Past Medical History:  Diagnosis Date   Allergy    Anxiety    just recently and seeing therapist.   Breech presentation 12/14/2020   Cesarean delivery - Breech, PPROM, [redacted]w[redacted]d labor 02/03/2021   Hemangioma vertebral column    Migraines    Preterm premature rupture of membranes (PPROM) 11/29/2020 11/29/2020    Past Surgical History:  Procedure Laterality Date   BACK SURGERY     CESAREAN SECTION N/A 02/03/2021   Procedure: Primary CESAREAN SECTION;  Surgeon: Shea Evans, MD;  Location: [facility];  Service: Obstetrics;  Laterality: N/A;  EDD: 03/25/21    Family History  Problem Relation Age of Onset   Healthy Mother    Healthy Father    Diabetes Mellitus II Maternal Grandmother    Heart failure Maternal Grandmother    Heart attack Maternal Grandfather    Alzheimer's disease Paternal Grandmother    Macular degeneration Paternal Grandfather    Breast cancer Cousin        breast cancer age 78, maternal    Social History   Socioeconomic History   Marital status: Divorced    Spouse name: Not on file   Number of children: Not on  file   Years of education: Not on file   Highest education level: Bachelor's degree (e.g., BA, AB, BS)  Occupational History   Not on file  Tobacco Use   Smoking status: Never   Smokeless tobacco: Never  Vaping Use   Vaping status: Never Used  Substance and Sexual Activity   Alcohol use: Yes    Comment: rare and if does very little.   Drug use: No   Sexual activity: Not Currently    Birth control/protection: None  Other Topics Concern   Not on file  Social History Narrative   Single   Lives with her son   Works as a Buyer, retail   Enjoys spending time with her son, gym, reading   Social Determinants of Health   Financial Resource Strain: Low Risk  (05/07/2023)   Overall Financial Resource Strain (CARDIA)    Difficulty of Paying Living Expenses: Not very hard  Food Insecurity: No Food Insecurity (05/07/2023)   Hunger Vital Sign    Worried About Running Out of Food in the Last Year: Never true    Ran Out of Food in the Last Year: Never true  Transportation Needs: No Transportation Needs (05/07/2023)   PRAPARE - Transportation    Lack of Transportation (  Medical): No    Lack of Transportation (Non-Medical): No  Physical Activity: Insufficiently Active (05/07/2023)   Exercise Vital Sign    Days of Exercise per Week: 3 days    Minutes of Exercise per Session: 40 min  Stress: No Stress Concern Present (05/07/2023)   [employer] of Occupational Health - Occupational Stress Questionnaire    Feeling of Stress : Only a little  Social Connections: Moderately Isolated (05/07/2023)   Social Connection and Isolation Panel [NHANES]    Frequency of Communication with Friends and Family: Three times a week    Frequency of Social Gatherings with Friends and Family: Once a week    Attends Religious Services: 1 to 4 times per year    Active Member of [employer] or Organizations: No    Attends Engineer, structural: Not on file    Marital Status: Divorced  Intimate  Partner Violence: Not on file    Outpatient Medications Prior to Visit  Medication Sig Dispense Refill   cetirizine (ZYRTEC) 10 MG chewable tablet Chew 10 mg by mouth daily.     cholecalciferol (VITAMIN D3) 25 MCG (1000 UNIT) tablet Take 1,000 Units by mouth daily.     escitalopram (LEXAPRO) 10 MG tablet Take 1.5 tablets (15 mg total) by mouth daily. 135 tablet 1   lisdexamfetamine (VYVANSE) 30 MG capsule Take 1 capsule (30 mg total) by mouth daily. 30 capsule 0   No facility-administered medications prior to visit.    No Known Allergies  ROS     Objective:    Physical Exam Constitutional:      Appearance: Normal appearance.  Cardiovascular:     Rate and Rhythm: Normal rate.  Pulmonary:     Effort: Pulmonary effort is normal.  Neurological:     Mental Status: She is alert.  Psychiatric:        Mood and Affect: Mood normal.        Behavior: Behavior normal.        Thought Content: Thought content normal.        Judgment: Judgment normal.      BP 129/80   Pulse 86   Temp 97.9 F (36.6 C) (Oral)   Ht 5\' 11"  (1.803 m)   Wt 266 lb 3.2 oz (120.7 kg)   SpO2 98%   BMI 37.13 kg/m  Wt Readings from Last 3 Encounters:  05/08/23 266 lb 3.2 oz (120.7 kg)  11/16/22 255 lb 3.2 oz (115.8 kg)  08/15/22 253 lb (114.8 kg)       Assessment & Plan:   Problem List Items Addressed This Visit       Unprioritized   Depression    Stable on lexapro. Continue same.       Attention deficit hyperactivity disorder (ADHD) - Primary    Improved with vyvanse.  Controlled substance contract is updated.  Will update UDS.       Relevant Orders   DRUG MONITORING, PANEL 8 WITH CONFIRMATION, URINE   Anxiety    Stable, continue lexapro.        I am having Primus Bravo Mirabal maintain her cetirizine, cholecalciferol, escitalopram, and lisdexamfetamine.  Meds ordered this encounter  Medications   lisdexamfetamine (VYVANSE) 30 MG capsule    Sig: Take 1 capsule (30 mg total) by  mouth daily.    Dispense:  30 capsule    Refill:  0

## 2023-05-09 NOTE — Assessment & Plan Note (Signed)
Stable on lexapro.  Continue same.

## 2023-05-09 NOTE — Assessment & Plan Note (Signed)
Improved with vyvanse.  Controlled substance contract is updated.  Will update UDS.

## 2023-05-09 NOTE — Assessment & Plan Note (Signed)
Stable, continue lexapro

## 2023-05-11 LAB — DRUG MONITORING, PANEL 8 WITH CONFIRMATION, URINE
6 Acetylmorphine: NEGATIVE ng/mL (ref <10)
Alcohol Metabolites: NEGATIVE ng/mL (ref <500)
Amphetamines: NEGATIVE ng/mL (ref <500)
Benzodiazepines: NEGATIVE ng/mL (ref <100)
Buprenorphine, Urine: NEGATIVE ng/mL (ref <5)
Cocaine Metabolite: NEGATIVE ng/mL (ref <150)
Creatinine: 174.3 mg/dL (ref >=20.0)
MDMA: NEGATIVE ng/mL (ref <500)
Marijuana Metabolite: 146 ng/mL — ABNORMAL HIGH (ref <5)
Marijuana Metabolite: POSITIVE ng/mL — AB (ref <20)
Opiates: NEGATIVE ng/mL (ref <100)
Oxidant: NEGATIVE ug/mL (ref <200)
Oxycodone: NEGATIVE ng/mL (ref <100)
pH: 7.6 (ref 4.5–9.0)

## 2023-05-11 LAB — DM TEMPLATE

## 2023-05-16 ENCOUNTER — Other Ambulatory Visit (HOSPITAL_COMMUNITY): Payer: Self-pay

## 2023-05-21 ENCOUNTER — Ambulatory Visit: Payer: Medicaid Other | Admitting: Family

## 2023-06-09 ENCOUNTER — Other Ambulatory Visit (HOSPITAL_COMMUNITY): Payer: Self-pay

## 2023-06-09 ENCOUNTER — Other Ambulatory Visit: Payer: Self-pay | Admitting: Family

## 2023-06-09 MED ORDER — LISDEXAMFETAMINE DIMESYLATE 30 MG PO CAPS
30.0000 mg | ORAL_CAPSULE | Freq: Every day | ORAL | 0 refills | Status: DC
  Filled 2023-06-09 – 2023-06-20 (×2): qty 30, 30d supply, fill #0

## 2023-06-10 ENCOUNTER — Other Ambulatory Visit (HOSPITAL_COMMUNITY): Payer: Self-pay

## 2023-06-18 ENCOUNTER — Encounter (HOSPITAL_COMMUNITY): Payer: Self-pay

## 2023-06-18 ENCOUNTER — Other Ambulatory Visit (HOSPITAL_COMMUNITY): Payer: Self-pay

## 2023-06-18 MED ORDER — WEGOVY 0.25 MG/0.5ML ~~LOC~~ SOAJ
0.2500 mg | SUBCUTANEOUS | 0 refills | Status: DC
  Filled 2023-06-18 – 2023-07-02 (×5): qty 2, 28d supply, fill #0

## 2023-06-20 ENCOUNTER — Other Ambulatory Visit (HOSPITAL_COMMUNITY): Payer: Self-pay

## 2023-06-27 ENCOUNTER — Other Ambulatory Visit (HOSPITAL_COMMUNITY): Payer: Self-pay

## 2023-07-02 ENCOUNTER — Other Ambulatory Visit (HOSPITAL_COMMUNITY): Payer: Self-pay

## 2023-07-12 ENCOUNTER — Telehealth (INDEPENDENT_AMBULATORY_CARE_PROVIDER_SITE_OTHER): Payer: Medicaid Other | Admitting: Family

## 2023-07-12 ENCOUNTER — Other Ambulatory Visit (HOSPITAL_COMMUNITY): Payer: Self-pay

## 2023-07-12 VITALS — Wt 260.0 lb

## 2023-07-12 DIAGNOSIS — F419 Anxiety disorder, unspecified: Secondary | ICD-10-CM | POA: Diagnosis not present

## 2023-07-12 DIAGNOSIS — E6609 Other obesity due to excess calories: Secondary | ICD-10-CM

## 2023-07-12 DIAGNOSIS — E66812 Obesity, class 2: Secondary | ICD-10-CM

## 2023-07-12 DIAGNOSIS — F909 Attention-deficit hyperactivity disorder, unspecified type: Secondary | ICD-10-CM

## 2023-07-12 DIAGNOSIS — Z6836 Body mass index (BMI) 36.0-36.9, adult: Secondary | ICD-10-CM

## 2023-07-12 MED ORDER — LISDEXAMFETAMINE DIMESYLATE 40 MG PO CAPS
40.0000 mg | ORAL_CAPSULE | ORAL | 0 refills | Status: DC
  Filled 2023-07-12 – 2023-08-16 (×3): qty 30, 30d supply, fill #0

## 2023-07-12 MED ORDER — WEGOVY 0.25 MG/0.5ML ~~LOC~~ SOAJ
0.2500 mg | SUBCUTANEOUS | 0 refills | Status: DC
  Filled 2023-07-12 – 2023-07-16 (×2): qty 2, 28d supply, fill #0

## 2023-07-12 NOTE — Progress Notes (Deleted)
Subjective:     Patient ID: Mikayla Hicks, female    DOB: 1985-12-30, 38 y.o.   MRN: [id number]  Chief Complaint  Patient presents with   Weight Management Screening    Will like to discuss medication for weight loss    HPI  Discussed the use of AI scribe software for clinical note transcription with the patient, who gave verbal consent to proceed.  History of Present Illness              Health Maintenance Due  Topic Date Due   COVID-19 Vaccine (1) Never done   Hepatitis C Screening  Never done   DTaP/Tdap/Td (1 - Tdap) Never done   Cervical Cancer Screening (HPV/Pap Cotest)  Never done    Past Medical History:  Diagnosis Date   Allergy    Anxiety    just recently and seeing therapist.   Breech presentation 12/14/2020   Cesarean delivery - Breech, PPROM, [redacted]w[redacted]d labor 02/03/2021   Hemangioma vertebral column    Migraines    Preterm premature rupture of membranes (PPROM) 11/29/2020 11/29/2020    Past Surgical History:  Procedure Laterality Date   BACK SURGERY     CESAREAN SECTION N/A 02/03/2021   Procedure: Primary CESAREAN SECTION;  Surgeon: Shea Evans, MD;  Location: [facility];  Service: Obstetrics;  Laterality: N/A;  EDD: 03/25/21    Family History  Problem Relation Age of Onset   Healthy Mother    Healthy Father    Diabetes Mellitus II Maternal Grandmother    Heart failure Maternal Grandmother    Heart attack Maternal Grandfather    Alzheimer's disease Paternal Grandmother    Macular degeneration Paternal Grandfather    Breast cancer Cousin        breast cancer age 56, maternal    Social History   Socioeconomic History   Marital status: Divorced    Spouse name: Not on file   Number of children: Not on file   Years of education: Not on file   Highest education level: Bachelor's degree (e.g., BA, AB, BS)  Occupational History   Not on file  Tobacco Use   Smoking status: Never   Smokeless tobacco: Never  Vaping Use   Vaping status:  Never Used  Substance and Sexual Activity   Alcohol use: Yes    Comment: rare and if does very little.   Drug use: No   Sexual activity: Not Currently    Birth control/protection: None  Other Topics Concern   Not on file  Social History Narrative   Single   Lives with her son   Works as a Buyer, retail   Enjoys spending time with her son, gym, reading   Social Drivers of Health   Financial Resource Strain: Low Risk  (05/07/2023)   Overall Financial Resource Strain (CARDIA)    Difficulty of Paying Living Expenses: Not very hard  Food Insecurity: No Food Insecurity (05/07/2023)   Hunger Vital Sign    Worried About Running Out of Food in the Last Year: Never true    Ran Out of Food in the Last Year: Never true  Transportation Needs: No Transportation Needs (05/07/2023)   PRAPARE - Administrator, Civil Service (Medical): No    Lack of Transportation (Non-Medical): No  Physical Activity: Insufficiently Active (05/07/2023)   Exercise Vital Sign    Days of Exercise per Week: 3 days    Minutes of Exercise per Session: 40 min  Stress: No  Stress Concern Present (05/07/2023)   [employer] of Occupational Health - Occupational Stress Questionnaire    Feeling of Stress : Only a little  Social Connections: Moderately Isolated (05/07/2023)   Social Connection and Isolation Panel [NHANES]    Frequency of Communication with Friends and Family: Three times a week    Frequency of Social Gatherings with Friends and Family: Once a week    Attends Religious Services: 1 to 4 times per year    Active Member of [employer] or Organizations: No    Attends Engineer, structural: Not on file    Marital Status: Divorced  Intimate Partner Violence: Not on file    Outpatient Medications Prior to Visit  Medication Sig Dispense Refill   cetirizine (ZYRTEC) 10 MG chewable tablet Chew 10 mg by mouth daily.     cholecalciferol (VITAMIN D3) 25 MCG (1000 UNIT) tablet Take 1,000  Units by mouth daily.     escitalopram (LEXAPRO) 10 MG tablet Take 1.5 tablets (15 mg total) by mouth daily. 135 tablet 1   lisdexamfetamine (VYVANSE) 30 MG capsule Take 1 capsule (30 mg total) by mouth daily. 30 capsule 0   Semaglutide-Weight Management (WEGOVY) 0.25 MG/0.5ML SOAJ Inject 0.25 mg into the skin once a week. (Patient not taking: Reported on 07/12/2023) 2 mL 0   No facility-administered medications prior to visit.    No Known Allergies  ROS     Objective:    Physical Exam   Wt 260 lb (117.9 kg)   BMI 36.26 kg/m  Wt Readings from Last 3 Encounters:  07/12/23 260 lb (117.9 kg)  05/08/23 266 lb 3.2 oz (120.7 kg)  11/16/22 255 lb 3.2 oz (115.8 kg)       Assessment & Plan:   Problem List Items Addressed This Visit   None   I have discontinued Lulie E. Parkhill's lisdexamfetamine. I am also having her start on lisdexamfetamine. Additionally, I am having her maintain her cetirizine, cholecalciferol, escitalopram, and Wegovy.  Meds ordered this encounter  Medications   Semaglutide-Weight Management (WEGOVY) 0.25 MG/0.5ML SOAJ    Sig: Inject 0.25 mg into the skin once a week.    Dispense:  2 mL    Refill:  0    4 pens in a kit    Supervising Provider:   Danise Edge A [[id number]]   lisdexamfetamine (VYVANSE) 40 MG capsule    Sig: Take 1 capsule (40 mg total) by mouth every morning.    Dispense:  30 capsule    Refill:  0    Supervising Provider:   Danise Edge A [[id number]]

## 2023-07-12 NOTE — Progress Notes (Signed)
MyChart Video Visit    Virtual Visit via Video Note    Patient location: Home. Patient and provider in visit Provider location: Office  I discussed the limitations of evaluation and management by telemedicine and the availability of in person appointments. The patient expressed understanding and agreed to proceed.  Visit Date: 07/12/2023  Today's healthcare provider: Lemont Fillers, NP     Subjective:    Patient ID: Mikayla Hicks, female    DOB: 1985-07-01, 38 y.o.   MRN: [id number]  Chief Complaint  Patient presents with   Weight Management Screening    Will like to discuss medication for weight loss    HPI  The patient presents with concerns about weight loss and medication management. Her mother is visiting to take her son for the weekend and she is looking forward to the break. She is interested in starting [facility] for weight loss. She attempted to initiate this medication last month through an online provider, [facility] but encountered issues with prior authorization due to insurance card mishandling. Despite providing her insurance card twice, the authorization was not sent, leading to frustration and prompting her current visit. Her current weight is 260 pounds, a decrease of six pounds since her last recorded weight. She has a diet plan that she is trying to follow but feels she needs additional help to continue losing weight. She is currently on Vyvanse and Lexapro. She experiences variability in her ability to focus throughout the day, with some days being better than others. On days she forgets to take Vyvanse or wakes up late, she still feels good. She is taking 15 mg of Lexapro and feels good on this dose. She mentions increased impulsivity, particularly with eating and shopping, which she attributes to her seasonal depression. She describes a pattern of impulsively grabbing items, especially food, which has been a longstanding coping mechanism  for her.   Past Medical History:  Diagnosis Date   Allergy    Anxiety    just recently and seeing therapist.   Breech presentation 12/14/2020   Cesarean delivery - Breech, PPROM, [redacted]w[redacted]d labor 02/03/2021   Hemangioma vertebral column    Migraines    Preterm premature rupture of membranes (PPROM) 11/29/2020 11/29/2020    Past Surgical History:  Procedure Laterality Date   BACK SURGERY     CESAREAN SECTION N/A 02/03/2021   Procedure: Primary CESAREAN SECTION;  Surgeon: Shea Evans, MD;  Location: [facility];  Service: Obstetrics;  Laterality: N/A;  EDD: 03/25/21    Family History  Problem Relation Age of Onset   Healthy Mother    Healthy Father    Diabetes Mellitus II Maternal Grandmother    Heart failure Maternal Grandmother    Heart attack Maternal Grandfather    Alzheimer's disease Paternal Grandmother    Macular degeneration Paternal Grandfather    Breast cancer Cousin        breast cancer age 1, maternal    Social History   Socioeconomic History   Marital status: Divorced    Spouse name: Not on file   Number of children: Not on file   Years of education: Not on file   Highest education level: Bachelor's degree (e.g., BA, AB, BS)  Occupational History   Not on file  Tobacco Use   Smoking status: Never   Smokeless tobacco: Never  Vaping Use   Vaping status: Never Used  Substance and Sexual Activity   Alcohol use: Yes  Comment: rare and if does very little.   Drug use: No   Sexual activity: Not Currently    Birth control/protection: None  Other Topics Concern   Not on file  Social History Narrative   Single   Lives with her son   Works as a Buyer, retail   Enjoys spending time with her son, gym, reading   Social Drivers of Health   Financial Resource Strain: Low Risk  (05/07/2023)   Overall Financial Resource Strain (CARDIA)    Difficulty of Paying Living Expenses: Not very hard  Food Insecurity: No Food Insecurity (05/07/2023)   Hunger  Vital Sign    Worried About Running Out of Food in the Last Year: Never true    Ran Out of Food in the Last Year: Never true  Transportation Needs: No Transportation Needs (05/07/2023)   PRAPARE - Administrator, Civil Service (Medical): No    Lack of Transportation (Non-Medical): No  Physical Activity: Insufficiently Active (05/07/2023)   Exercise Vital Sign    Days of Exercise per Week: 3 days    Minutes of Exercise per Session: 40 min  Stress: No Stress Concern Present (05/07/2023)   [employer] of Occupational Health - Occupational Stress Questionnaire    Feeling of Stress : Only a little  Social Connections: Moderately Isolated (05/07/2023)   Social Connection and Isolation Panel [NHANES]    Frequency of Communication with Friends and Family: Three times a week    Frequency of Social Gatherings with Friends and Family: Once a week    Attends Religious Services: 1 to 4 times per year    Active Member of [employer] or Organizations: No    Attends Engineer, structural: Not on file    Marital Status: Divorced  Intimate Partner Violence: Not on file    Outpatient Medications Prior to Visit  Medication Sig Dispense Refill   cetirizine (ZYRTEC) 10 MG chewable tablet Chew 10 mg by mouth daily.     cholecalciferol (VITAMIN D3) 25 MCG (1000 UNIT) tablet Take 1,000 Units by mouth daily.     escitalopram (LEXAPRO) 10 MG tablet Take 1.5 tablets (15 mg total) by mouth daily. 135 tablet 1   lisdexamfetamine (VYVANSE) 30 MG capsule Take 1 capsule (30 mg total) by mouth daily. 30 capsule 0   Semaglutide-Weight Management (WEGOVY) 0.25 MG/0.5ML SOAJ Inject 0.25 mg into the skin once a week. (Patient not taking: Reported on 07/12/2023) 2 mL 0   No facility-administered medications prior to visit.    No Known Allergies  ROS     Objective:    Physical Exam Constitutional:      General: She is not in acute distress.    Appearance: Normal appearance. She is  well-developed.  HENT:     Head: Normocephalic and atraumatic.     Right Ear: External ear normal.     Left Ear: External ear normal.  Eyes:     General: No scleral icterus. Neck:     Thyroid: No thyromegaly.  Pulmonary:     Effort: Pulmonary effort is normal.  Skin:    General: Skin is warm and dry.  Neurological:     Mental Status: She is alert and oriented to person, place, and time.  Psychiatric:        Mood and Affect: Mood normal.        Behavior: Behavior normal.        Thought Content: Thought content normal.  Judgment: Judgment normal.     Comments: Seems easily distracted     Wt 260 lb (117.9 kg)   BMI 36.26 kg/m  Wt Readings from Last 3 Encounters:  07/12/23 260 lb (117.9 kg)  05/08/23 266 lb 3.2 oz (120.7 kg)  11/16/22 255 lb 3.2 oz (115.8 kg)       Assessment & Plan:   Problem List Items Addressed This Visit       Unprioritized   Class 2 obesity due to excess calories without serious comorbidity with body mass index (BMI) of 36.0 to 36.9 in adult   Trial of Wegovy. Discussed common side effects.  Continue work on diet/exercise.      Relevant Medications   Semaglutide-Weight Management (WEGOVY) 0.25 MG/0.5ML SOAJ   lisdexamfetamine (VYVANSE) 40 MG capsule   Attention deficit hyperactivity disorder (ADHD) - Primary    Patient reports inconsistent focus and increased impulsivity, particularly with shopping and eating. Currently on Vyvanse 30mg .  -Increase Vyvanse to 40mg  daily.  -Plan to reassess response and side effects at next visit.       Anxiety    Patient is currently on Lexapro 15mg  and reports feeling generally good on this dose. Possible seasonal affective symptoms.  -Continue Lexapro 15mg  daily.  -Consider dose adjustment if mood symptoms worsen.        I have discontinued Mikayla Hicks's lisdexamfetamine. I am also having her start on lisdexamfetamine. Additionally, I am having her maintain her cetirizine, cholecalciferol,  escitalopram, and Wegovy.  Meds ordered this encounter  Medications   Semaglutide-Weight Management (WEGOVY) 0.25 MG/0.5ML SOAJ    Sig: Inject 0.25 mg into the skin once a week.    Dispense:  2 mL    Refill:  0    4 pens in a kit    Supervising Provider:   Danise Edge A [[id number]]   lisdexamfetamine (VYVANSE) 40 MG capsule    Sig: Take 1 capsule (40 mg total) by mouth every morning.    Dispense:  30 capsule    Refill:  0    Supervising Provider:   Danise Edge A [[id number]]    I discussed the assessment and treatment plan with the patient. The patient was provided an opportunity to ask questions and all were answered. The patient agreed with the plan and demonstrated an understanding of the instructions.   The patient was advised to call back or seek an in-person evaluation if the symptoms worsen or if the condition fails to improve as anticipated.   Lemont Fillers, NP [facility] [phone] (phone) [phone] (fax)  [facility]

## 2023-07-12 NOTE — Assessment & Plan Note (Signed)
  Patient is currently on Lexapro 15mg  and reports feeling generally good on this dose. Possible seasonal affective symptoms.  -Continue Lexapro 15mg  daily.  -Consider dose adjustment if mood symptoms worsen.

## 2023-07-12 NOTE — Assessment & Plan Note (Signed)
Trial of Wegovy. Discussed common side effects.  Continue work on diet/exercise.

## 2023-07-12 NOTE — Patient Instructions (Signed)
VISIT SUMMARY:  Today, we discussed your concerns about weight loss and medication management. You mentioned difficulties in obtaining [facility] for weight loss and variability in your focus and impulsivity, which you attribute to seasonal depression. We reviewed your current medications and made some adjustments to better support your health goals.  YOUR PLAN:  -OBESITY: Obesity is a condition characterized by excessive body fat. We will submit a prior authorization for Wegovy to be filled at [facility] pharmacy. You will start Wegovy at 0.25mg  once weekly, with plans to increase the dose as tolerated. Please follow up in one month to assess your response and any side effects.  -ADHD: Attention Deficit Hyperactivity Disorder (ADHD) affects your ability to focus and control impulses. We will increase your Vyvanse dose to 40mg  daily to help manage your symptoms. We will reassess your response and any side effects at your next visit.  -DEPRESSION: Depression is a mood disorder that causes persistent feelings of sadness and loss of interest. You will continue taking Lexapro 15mg  daily. If your mood symptoms worsen, we may consider adjusting the dose.  INSTRUCTIONS:  Please follow up in one month to assess your response to [facility] and the increased dose of Vyvanse. If you experience any side effects or if your mood symptoms worsen, contact our office.

## 2023-07-12 NOTE — Assessment & Plan Note (Signed)
  Patient reports inconsistent focus and increased impulsivity, particularly with shopping and eating. Currently on Vyvanse 30mg .  -Increase Vyvanse to 40mg  daily.  -Plan to reassess response and side effects at next visit.

## 2023-07-16 ENCOUNTER — Telehealth: Payer: Self-pay

## 2023-07-16 ENCOUNTER — Other Ambulatory Visit (HOSPITAL_COMMUNITY): Payer: Self-pay

## 2023-07-16 NOTE — Telephone Encounter (Signed)
 Pharmacy Patient Advocate Encounter  Received notification from [facility] that Prior Authorization for Wegovy  0.25MG /0.5ML auto-injectors has been APPROVED from 07/16/23 to 01/12/24. Ran test claim, Copay is $4. This test claim was processed through [facility] Pharmacy- copay amounts may vary at other pharmacies due to pharmacy/plan contracts, or as the patient moves through the different stages of their insurance plan.   PA #/Case ID/Reference #: [id number]

## 2023-07-16 NOTE — Telephone Encounter (Signed)
 Pharmacy Patient Advocate Encounter   Received notification from  [facility] Portal that prior authorization for Wegovy  0.25MG /0.5ML auto-injectors  is required/requested.   Insurance verification completed.   The patient is insured through [facility] .   Per test claim: PA required; PA submitted to above mentioned insurance via CoverMyMeds Key/confirmation #/EOC [id number] Status is pending

## 2023-07-17 ENCOUNTER — Other Ambulatory Visit (HOSPITAL_COMMUNITY): Payer: Self-pay

## 2023-07-18 ENCOUNTER — Other Ambulatory Visit (HOSPITAL_COMMUNITY): Payer: Self-pay

## 2023-07-30 ENCOUNTER — Other Ambulatory Visit (HOSPITAL_COMMUNITY): Payer: Self-pay

## 2023-08-13 ENCOUNTER — Ambulatory Visit: Payer: Medicaid Other | Admitting: Family

## 2023-08-16 ENCOUNTER — Telehealth: Payer: Self-pay | Admitting: Family

## 2023-08-16 ENCOUNTER — Other Ambulatory Visit (HOSPITAL_COMMUNITY): Payer: Self-pay

## 2023-08-16 ENCOUNTER — Ambulatory Visit: Payer: Medicaid Other | Admitting: Family

## 2023-08-16 VITALS — BP 121/77 | HR 79 | Temp 97.9°F | Resp 16 | Ht 71.0 in | Wt 260.0 lb

## 2023-08-16 DIAGNOSIS — F909 Attention-deficit hyperactivity disorder, unspecified type: Secondary | ICD-10-CM

## 2023-08-16 DIAGNOSIS — E66812 Obesity, class 2: Secondary | ICD-10-CM

## 2023-08-16 DIAGNOSIS — F419 Anxiety disorder, unspecified: Secondary | ICD-10-CM

## 2023-08-16 DIAGNOSIS — L309 Dermatitis, unspecified: Secondary | ICD-10-CM

## 2023-08-16 DIAGNOSIS — Z6836 Body mass index (BMI) 36.0-36.9, adult: Secondary | ICD-10-CM

## 2023-08-16 DIAGNOSIS — E6609 Other obesity due to excess calories: Secondary | ICD-10-CM

## 2023-08-16 DIAGNOSIS — F32A Depression, unspecified: Secondary | ICD-10-CM | POA: Diagnosis not present

## 2023-08-16 MED ORDER — WEGOVY 0.5 MG/0.5ML ~~LOC~~ SOAJ
0.5000 mg | SUBCUTANEOUS | 2 refills | Status: DC
  Filled 2023-08-16: qty 2, 28d supply, fill #0
  Filled 2023-09-07: qty 2, 28d supply, fill #1
  Filled 2023-10-06: qty 2, 28d supply, fill #2

## 2023-08-16 MED ORDER — BETAMETHASONE DIPROPIONATE 0.05 % EX CREA
TOPICAL_CREAM | Freq: Two times a day (BID) | CUTANEOUS | 0 refills | Status: DC
  Filled 2023-08-16: qty 30, 15d supply, fill #0

## 2023-08-16 MED ORDER — ESCITALOPRAM OXALATE 10 MG PO TABS
15.0000 mg | ORAL_TABLET | Freq: Every day | ORAL | 1 refills | Status: DC
  Filled 2023-08-16: qty 30, 20d supply, fill #0
  Filled 2023-08-16: qty 105, 70d supply, fill #0
  Filled 2023-11-19: qty 135, 90d supply, fill #1

## 2023-08-16 NOTE — Assessment & Plan Note (Addendum)
 Stable on current dose of vyvanse.  Updated contract today.

## 2023-08-16 NOTE — Assessment & Plan Note (Addendum)
 Wt Readings from Last 3 Encounters:  08/16/23 260 lb (117.9 kg)  07/12/23 260 lb (117.9 kg)  05/08/23 266 lb 3.2 oz (120.7 kg)   Notes improvement in food noise.  Tolerating NWGNFA. Will increase dose from 0.25mg  to 0.5mg .

## 2023-08-16 NOTE — Telephone Encounter (Signed)
 Please request pap report from [facility].

## 2023-08-16 NOTE — Assessment & Plan Note (Signed)
Stable on lexapro, continue same.  

## 2023-08-16 NOTE — Assessment & Plan Note (Signed)
 New. Trial of betamethasone cream to affected areas bid for 2 weeks max.

## 2023-08-16 NOTE — Patient Instructions (Signed)
 VISIT SUMMARY:  During today's visit, we reviewed your current medications and overall health. Your attention and focus have improved with Vyvanse, and your mood remains stable on Lexapro. We discussed your weight management progress with YNWGNF and addressed your concerns about dry skin. We also reviewed your recent tetanus shot and planned for future health screenings.  YOUR PLAN:  -ATTENTION DEFICIT HYPERACTIVITY DISORDER (ADHD): ADHD is a condition characterized by symptoms of inattention, hyperactivity, and impulsivity. Your attention and focus have improved with Vyvanse, and you have not experienced any side effects. Please continue taking Vyvanse as prescribed.  -ANXIETY: Anxiety is a condition that involves excessive worry or fear. Your mood remains stable on Lexapro, and you have not experienced any side effects. Please continue taking Lexapro as prescribed, and your prescription will be refilled.  -WEIGHT MANAGEMENT: Weight management involves maintaining a healthy weight through diet, exercise, and sometimes medication. Reginal Lutes has been effective in reducing your appetite and aiding in weight loss. We will increase your dose to 0.5 mg and continue to monitor your progress.  -ECZEMA: Eczema is a condition that causes dry, itchy, and inflamed skin. You have been experiencing dry skin, especially on your face. We will prescribe a topical steroid cream to use twice daily for up to two weeks and recommend using a good moisturizer. If there is no improvement, we may refer you to a dermatologist.  INSTRUCTIONS:  Please schedule a physical exam in three months. At that time, we can also consider a Pap smear based on your preference. We will conduct your next Hepatitis C screening and labs during your next visit. Additionally, please obtain a copy of your last mammogram from [facility] for our records.

## 2023-08-16 NOTE — Telephone Encounter (Signed)
 Electronic request sent

## 2023-08-16 NOTE — Progress Notes (Signed)
 Subjective:     Patient ID: Mikayla Hicks, female    DOB: [birth year], 38 y.o.   MRN: [id number]  Chief Complaint  Patient presents with   ADHD    Here for follow up   Obesity    Here for follow up, on wegovy    HPI  Discussed the use of AI scribe software for clinical note transcription with the patient, who gave verbal consent to proceed.  History of Present Illness  The patient presents for a follow-up on her medications.  She is currently taking Vyvanse for ADHD, which has improved her attention and focus without any side effects. She regularly monitors her blood pressure at work, and it remains stable.  Her mood is stable on Lexapro, and she experiences no side effects. She is due for a refill soon.  She is taking Wegovy at a dose of 0.25 mg and wants to increase the dose. It has significantly reduced her 'food noise' and has been beneficial for her weight management, even during a recent trip to [place].  She experiences dry skin, which she describes as eczema, particularly on her face. She has been using diaper rash cream and cocoa butter, but the dryness persists, especially since wearing masks at work.  She recalls receiving a tetanus shot in August 2022 and is unsure about her hepatitis C screening history.     Health Maintenance Due  Topic Date Due   COVID-19 Vaccine (1) Never done   Hepatitis C Screening  Never done   Cervical Cancer Screening (HPV/Pap Cotest)  Never done    Past Medical History:  Diagnosis Date   Allergy    Anxiety    just recently and seeing therapist.   Breech presentation 12/14/2020   Cesarean delivery - Breech, PPROM, [redacted]w[redacted]d labor 02/03/2021   Hemangioma vertebral column    Migraines    Preterm premature rupture of membranes (PPROM) 11/29/2020 11/29/2020    Past Surgical History:  Procedure Laterality Date   BACK SURGERY     CESAREAN SECTION N/A 02/03/2021   Procedure: Primary CESAREAN SECTION;  Surgeon: Shea Evans,  MD;  Location: [facility] LD ORS;  Service: Obstetrics;  Laterality: N/A;  EDD: 03/25/21    Family History  Problem Relation Age of Onset   Healthy Mother    Healthy Father    Diabetes Mellitus II Maternal Grandmother    Heart failure Maternal Grandmother    Heart attack Maternal Grandfather    Alzheimer's disease Paternal Grandmother    Macular degeneration Paternal Grandfather    Breast cancer Cousin        breast cancer age 53, maternal    Social History   Socioeconomic History   Marital status: Divorced    Spouse name: Not on file   Number of children: Not on file   Years of education: Not on file   Highest education level: Bachelor's degree (e.g., BA, AB, BS)  Occupational History   Not on file  Tobacco Use   Smoking status: Never   Smokeless tobacco: Never  Vaping Use   Vaping status: Never Used  Substance and Sexual Activity   Alcohol use: Yes    Comment: rare and if does very little.   Drug use: No   Sexual activity: Not Currently    Birth control/protection: None  Other Topics Concern   Not on file  Social History Narrative   Single   Lives with her son   Works as a Buyer, retail  Enjoys spending time with her son, gym, reading   Social Drivers of Health   Financial Resource Strain: Medium Risk (08/15/2023)   Overall Financial Resource Strain (CARDIA)    Difficulty of Paying Living Expenses: Somewhat hard  Food Insecurity: Food Insecurity Present (08/15/2023)   Hunger Vital Sign    Worried About Running Out of Food in the Last Year: Sometimes true    Ran Out of Food in the Last Year: Never true  Transportation Needs: No Transportation Needs (08/15/2023)   PRAPARE - Administrator, Civil Service (Medical): No    Lack of Transportation (Non-Medical): No  Physical Activity: Insufficiently Active (08/15/2023)   Exercise Vital Sign    Days of Exercise per Week: 3 days    Minutes of Exercise per Session: 30 min  Stress: No Stress Concern Present  (08/15/2023)   [employer] of Occupational Health - Occupational Stress Questionnaire    Feeling of Stress : Only a little  Social Connections: Moderately Isolated (08/15/2023)   Social Connection and Isolation Panel [NHANES]    Frequency of Communication with Friends and Family: Three times a week    Frequency of Social Gatherings with Friends and Family: Once a week    Attends Religious Services: 1 to 4 times per year    Active Member of [employer] or Organizations: No    Attends Engineer, structural: Not on file    Marital Status: Divorced  Catering manager Violence: Not on file    Outpatient Medications Prior to Visit  Medication Sig Dispense Refill   cholecalciferol (VITAMIN D3) 25 MCG (1000 UNIT) tablet Take 1,000 Units by mouth daily.     lisdexamfetamine (VYVANSE) 40 MG capsule Take 1 capsule (40 mg total) by mouth every morning. 30 capsule 0   escitalopram (LEXAPRO) 10 MG tablet Take 1.5 tablets (15 mg total) by mouth daily. 135 tablet 1   Semaglutide-Weight Management (WEGOVY) 0.25 MG/0.5ML SOAJ Inject 0.25 mg into the skin once a week. 2 mL 0   cetirizine (ZYRTEC) 10 MG chewable tablet Chew 10 mg by mouth daily.     No facility-administered medications prior to visit.    No Known Allergies  ROS See HPI    Objective:    Physical Exam Constitutional:      General: She is not in acute distress.    Appearance: Normal appearance. She is well-developed.  HENT:     Head: Normocephalic and atraumatic.     Right Ear: External ear normal.     Left Ear: External ear normal.  Eyes:     General: No scleral icterus. Neck:     Thyroid: No thyromegaly.  Cardiovascular:     Rate and Rhythm: Normal rate and regular rhythm.     Heart sounds: Normal heart sounds. No murmur heard. Pulmonary:     Effort: Pulmonary effort is normal. No respiratory distress.     Breath sounds: Normal breath sounds. No wheezing.  Musculoskeletal:     Cervical back: Neck supple.  Skin:     General: Skin is warm and dry.     Comments: Dry rash lower cheeks  Neurological:     Mental Status: She is alert and oriented to person, place, and time.  Psychiatric:        Mood and Affect: Mood normal.        Behavior: Behavior normal.        Thought Content: Thought content normal.        Judgment: Judgment  normal.      BP 121/77 (BP Location: Right Arm, Patient Position: Sitting, Cuff Size: Large)   Pulse 79   Temp 97.9 F (36.6 C) (Oral)   Resp 16   Ht 5\' 11"  (1.803 m)   Wt 260 lb (117.9 kg)   SpO2 99%   BMI 36.26 kg/m  Wt Readings from Last 3 Encounters:  08/16/23 260 lb (117.9 kg)  07/12/23 260 lb (117.9 kg)  05/08/23 266 lb 3.2 oz (120.7 kg)       Assessment & Plan:   Problem List Items Addressed This Visit       Unprioritized   Eczema   New. Trial of betamethasone cream to affected areas bid for 2 weeks max.      Relevant Medications   betamethasone dipropionate 0.05 % cream   Depression   Stable on lexapro, continue same.       Relevant Medications   escitalopram (LEXAPRO) 10 MG tablet   Class 2 obesity due to excess calories without serious comorbidity with body mass index (BMI) of 36.0 to 36.9 in adult   Wt Readings from Last 3 Encounters:  08/16/23 260 lb (117.9 kg)  07/12/23 260 lb (117.9 kg)  05/08/23 266 lb 3.2 oz (120.7 kg)   Notes improvement in food noise.  Tolerating ATFTDD. Will increase dose from 0.25mg  to 0.5mg .        Relevant Medications   Semaglutide-Weight Management (WEGOVY) 0.5 MG/0.5ML SOAJ   Attention deficit hyperactivity disorder (ADHD) - Primary   Stable on current dose of vyvanse.  Updated contract today.       Anxiety   Stable on lexapro.  Continue same.       Relevant Medications   escitalopram (LEXAPRO) 10 MG tablet    I have discontinued Rakesha E. Gordon's cetirizine and Wegovy. I am also having her start on Wegovy and betamethasone dipropionate. Additionally, I am having her maintain her  cholecalciferol, lisdexamfetamine, and escitalopram.  Meds ordered this encounter  Medications   Semaglutide-Weight Management (WEGOVY) 0.5 MG/0.5ML SOAJ    Sig: Inject 0.5 mg into the skin once a week.    Dispense:  2 mL    Refill:  2    Supervising Provider:   Danise Edge A [[id number]]   betamethasone dipropionate 0.05 % cream    Sig: Apply topically 2 (two) times daily.    Dispense:  30 g    Refill:  0    Supervising Provider:   Danise Edge A [[id number]]   escitalopram (LEXAPRO) 10 MG tablet    Sig: Take 1.5 tablets (15 mg total) by mouth daily.    Dispense:  135 tablet    Refill:  1    Supervising Provider:   Danise Edge A [[id number]]

## 2023-08-16 NOTE — Assessment & Plan Note (Signed)
 Stable on lexapro.  Continue same.

## 2023-08-28 ENCOUNTER — Other Ambulatory Visit (HOSPITAL_COMMUNITY): Payer: Self-pay

## 2023-08-28 MED ORDER — ASPIRIN 81 MG PO TBEC
81.0000 mg | DELAYED_RELEASE_TABLET | Freq: Two times a day (BID) | ORAL | 0 refills | Status: DC
  Filled 2023-08-28: qty 60, 30d supply, fill #0

## 2023-08-28 MED ORDER — ONDANSETRON HCL 4 MG PO TABS
4.0000 mg | ORAL_TABLET | Freq: Three times a day (TID) | ORAL | 0 refills | Status: DC | PRN
  Filled 2023-08-28: qty 20, 7d supply, fill #0

## 2023-08-28 MED ORDER — METHOCARBAMOL 750 MG PO TABS
750.0000 mg | ORAL_TABLET | Freq: Three times a day (TID) | ORAL | 0 refills | Status: DC | PRN
  Filled 2023-08-28: qty 45, 15d supply, fill #0

## 2023-08-28 MED ORDER — NAPROXEN 500 MG PO TABS
500.0000 mg | ORAL_TABLET | Freq: Two times a day (BID) | ORAL | 0 refills | Status: DC
  Filled 2023-08-28: qty 60, 30d supply, fill #0

## 2023-08-28 MED ORDER — ACETAMINOPHEN 500 MG PO TABS
1000.0000 mg | ORAL_TABLET | Freq: Three times a day (TID) | ORAL | 0 refills | Status: DC
  Filled 2023-08-28: qty 180, 30d supply, fill #0

## 2023-08-28 MED ORDER — OXYCODONE HCL 5 MG PO TABS
5.0000 mg | ORAL_TABLET | ORAL | 0 refills | Status: DC | PRN
  Filled 2023-08-28: qty 42, 4d supply, fill #0

## 2023-09-06 ENCOUNTER — Ambulatory Visit: Payer: Medicaid Other | Admitting: Family

## 2023-10-30 ENCOUNTER — Encounter (INDEPENDENT_AMBULATORY_CARE_PROVIDER_SITE_OTHER): Payer: Self-pay | Admitting: Family

## 2023-10-30 ENCOUNTER — Other Ambulatory Visit (HOSPITAL_COMMUNITY): Payer: Self-pay

## 2023-10-30 ENCOUNTER — Telehealth: Payer: Self-pay

## 2023-10-30 DIAGNOSIS — L309 Dermatitis, unspecified: Secondary | ICD-10-CM | POA: Diagnosis not present

## 2023-10-30 MED ORDER — TACROLIMUS 0.03 % EX OINT
TOPICAL_OINTMENT | Freq: Two times a day (BID) | CUTANEOUS | 1 refills | Status: DC
  Filled 2023-10-30 – 2023-10-31 (×2): qty 100, 30d supply, fill #0

## 2023-10-30 MED ORDER — METHOCARBAMOL 750 MG PO TABS
750.0000 mg | ORAL_TABLET | Freq: Three times a day (TID) | ORAL | 0 refills | Status: DC
Start: 2023-10-30 — End: 2024-02-14
  Filled 2023-10-30: qty 45, 15d supply, fill #0

## 2023-10-30 NOTE — Telephone Encounter (Signed)
 Pharmacy Patient Advocate Encounter   Received notification from CoverMyMeds that prior authorization for Tacrolimus 0.03% ointment  is required/requested.   Insurance verification completed.   The patient is insured through [facility] .   Per test claim: PA required; PA started via CoverMyMeds. KEY [id number] . Waiting for clinical questions to populate.

## 2023-10-30 NOTE — Telephone Encounter (Signed)

## 2023-10-31 ENCOUNTER — Other Ambulatory Visit (HOSPITAL_COMMUNITY): Payer: Self-pay

## 2023-10-31 ENCOUNTER — Other Ambulatory Visit: Payer: Self-pay | Admitting: Family

## 2023-10-31 MED ORDER — LISDEXAMFETAMINE DIMESYLATE 40 MG PO CAPS
40.0000 mg | ORAL_CAPSULE | ORAL | 0 refills | Status: DC
  Filled 2023-10-31: qty 30, 30d supply, fill #0

## 2023-10-31 NOTE — Telephone Encounter (Signed)
 PLEASE BE ADVISED Clinical questions have been answered and PA submitted.TO PLAN. PA currently Pending.

## 2023-10-31 NOTE — Telephone Encounter (Signed)
 Requesting: Vyvanse  40mg   Contract: 09/05/23 UDS: 05/08/23 Last Visit: 08/16/23 Next Visit: 11/19/23 Last Refill: 07/12/23 #30 and 0RF   Please Advise

## 2023-11-01 ENCOUNTER — Other Ambulatory Visit (HOSPITAL_COMMUNITY): Payer: Self-pay

## 2023-11-01 NOTE — Telephone Encounter (Signed)
 Pharmacy Patient Advocate Encounter  Received notification from [facility] that Prior Authorization for Tacrolimus 0.03% ointment has been APPROVED from 10/31/23 to 10/30/24. Ran test claim, Copay is $4. This test claim was processed through [facility]- copay amounts may vary at other pharmacies due to pharmacy/plan contracts, or as the patient moves through the different stages of their insurance plan.   PA #/Case ID/Reference #: [id number]

## 2023-11-19 ENCOUNTER — Encounter: Payer: Self-pay | Admitting: Family

## 2023-11-19 ENCOUNTER — Other Ambulatory Visit (HOSPITAL_COMMUNITY): Payer: Self-pay

## 2023-11-19 ENCOUNTER — Ambulatory Visit (INDEPENDENT_AMBULATORY_CARE_PROVIDER_SITE_OTHER): Admitting: Family

## 2023-11-19 VITALS — BP 123/82 | HR 92 | Temp 98.4°F | Resp 16 | Ht 71.0 in | Wt 253.0 lb

## 2023-11-19 DIAGNOSIS — E6609 Other obesity due to excess calories: Secondary | ICD-10-CM | POA: Diagnosis not present

## 2023-11-19 DIAGNOSIS — Z6836 Body mass index (BMI) 36.0-36.9, adult: Secondary | ICD-10-CM | POA: Diagnosis not present

## 2023-11-19 DIAGNOSIS — F419 Anxiety disorder, unspecified: Secondary | ICD-10-CM

## 2023-11-19 DIAGNOSIS — F909 Attention-deficit hyperactivity disorder, unspecified type: Secondary | ICD-10-CM | POA: Diagnosis not present

## 2023-11-19 DIAGNOSIS — Z0000 Encounter for general adult medical examination without abnormal findings: Secondary | ICD-10-CM | POA: Diagnosis not present

## 2023-11-19 DIAGNOSIS — Z862 Personal history of diseases of the blood and blood-forming organs and certain disorders involving the immune mechanism: Secondary | ICD-10-CM | POA: Diagnosis not present

## 2023-11-19 DIAGNOSIS — E559 Vitamin D deficiency, unspecified: Secondary | ICD-10-CM | POA: Diagnosis not present

## 2023-11-19 DIAGNOSIS — D649 Anemia, unspecified: Secondary | ICD-10-CM

## 2023-11-19 DIAGNOSIS — F32A Depression, unspecified: Secondary | ICD-10-CM

## 2023-11-19 DIAGNOSIS — F418 Other specified anxiety disorders: Secondary | ICD-10-CM

## 2023-11-19 DIAGNOSIS — E66812 Obesity, class 2: Secondary | ICD-10-CM | POA: Diagnosis not present

## 2023-11-19 DIAGNOSIS — L309 Dermatitis, unspecified: Secondary | ICD-10-CM

## 2023-11-19 LAB — CBC WITH DIFFERENTIAL/PLATELET
Absolute Lymphocytes: 2464 {cells}/uL (ref 850–3900)
Absolute Monocytes: 544 {cells}/uL (ref 200–950)
Basophils Absolute: 40 {cells}/uL (ref 0–200)
Basophils Relative: 0.5 %
Eosinophils Absolute: 184 {cells}/uL (ref 15–500)
Eosinophils Relative: 2.3 %
HCT: 44.1 % (ref 35.0–45.0)
Hemoglobin: 13.9 g/dL (ref 11.7–15.5)
MCH: 27.6 pg (ref 27.0–33.0)
MCHC: 31.5 g/dL — ABNORMAL LOW (ref 32.0–36.0)
MCV: 87.5 fL (ref 80.0–100.0)
MPV: 10.9 fL (ref 7.5–12.5)
Monocytes Relative: 6.8 %
Neutro Abs: 4768 {cells}/uL (ref 1500–7800)
Neutrophils Relative %: 59.6 %
Platelets: 344 10*3/uL (ref 140–400)
RBC: 5.04 10*6/uL (ref 3.80–5.10)
RDW: 12.9 % (ref 11.0–15.0)
Total Lymphocyte: 30.8 %
WBC: 8 10*3/uL (ref 3.8–10.8)

## 2023-11-19 LAB — COMPREHENSIVE METABOLIC PANEL WITH GFR
AG Ratio: 1.7 (calc) (ref 1.0–2.5)
ALT: 14 U/L (ref 6–29)
AST: 17 U/L (ref 10–30)
Albumin: 4.5 g/dL (ref 3.6–5.1)
Alkaline phosphatase (APISO): 79 U/L (ref 31–125)
BUN: 11 mg/dL (ref 7–25)
CO2: 28 mmol/L (ref 20–32)
Calcium: 9.6 mg/dL (ref 8.6–10.2)
Chloride: 103 mmol/L (ref 98–110)
Creat: 0.83 mg/dL (ref 0.50–0.97)
Globulin: 2.7 g/dL (ref 1.9–3.7)
Glucose, Bld: 92 mg/dL (ref 65–99)
Potassium: 4.7 mmol/L (ref 3.5–5.3)
Sodium: 139 mmol/L (ref 135–146)
Total Bilirubin: 0.5 mg/dL (ref 0.2–1.2)
Total Protein: 7.2 g/dL (ref 6.1–8.1)
eGFR: 94 mL/min/{1.73_m2} (ref >=60)

## 2023-11-19 LAB — LIPID PANEL
Cholesterol: 186 mg/dL (ref <200)
HDL: 62 mg/dL (ref >=50)
LDL Cholesterol (Calc): 105 mg/dL — ABNORMAL HIGH
Non-HDL Cholesterol (Calc): 124 mg/dL (ref <130)
Total CHOL/HDL Ratio: 3 (calc) (ref <5.0)
Triglycerides: 94 mg/dL (ref <150)

## 2023-11-19 LAB — VITAMIN D 25 HYDROXY (VIT D DEFICIENCY, FRACTURES): Vit D, 25-Hydroxy: 31 ng/mL (ref 30–100)

## 2023-11-19 MED ORDER — WEGOVY 1 MG/0.5ML ~~LOC~~ SOAJ
1.0000 mg | SUBCUTANEOUS | 2 refills | Status: DC
Start: 2023-11-19 — End: 2024-02-14
  Filled 2023-11-19: qty 2, 28d supply, fill #0
  Filled 2023-12-19: qty 2, 28d supply, fill #1
  Filled 2024-01-23 – 2024-02-03 (×4): qty 2, 28d supply, fill #2

## 2023-11-19 NOTE — Assessment & Plan Note (Signed)
Stable on vyvanse

## 2023-11-19 NOTE — Assessment & Plan Note (Signed)
 Noted during pregnancy- update cbc today.

## 2023-11-19 NOTE — Assessment & Plan Note (Signed)
Stable on lexapro, continue same.  

## 2023-11-19 NOTE — Assessment & Plan Note (Addendum)
 Tolerating wegovy  0.5mg . Will increase to 1.0 mg weekly as tolerated. Noting weight loss and improved hunger management. Wt Readings from Last 3 Encounters:  11/19/23 253 lb (114.8 kg)  08/16/23 260 lb (117.9 kg)  07/12/23 260 lb (117.9 kg)

## 2023-11-19 NOTE — Patient Instructions (Signed)
 VISIT SUMMARY:  Today, we reviewed your physical exam and followed up on your foot surgery. We discussed your muscle spasms, weight management, ADHD, and eczema treatment. We also reviewed your general health maintenance and planned necessary lab tests.  YOUR PLAN:  POSTOPERATIVE CARE FOR FOOT SURGERY: You had corrective surgery for your pinky toe in March and are experiencing muscle spasms. -Continue taking Robaxin  as needed for muscle spasms. -Gradually return to physical activity as tolerated.  WEIGHT MANAGEMENT: You are currently on Wegovy  0.5 mg and have lost 12 pounds. We discussed increasing the dose to 1 mg. -Increase Wegovy  to 1 mg. -Monitor for side effects such as nausea and constipation. -Continue eating small, frequent meals throughout the day.  ADHD: Vyvanse  is effectively managing your ADHD symptoms, and Lexapro  is stabilizing your mood. -Continue taking Vyvanse  and Lexapro  as prescribed. -Follow up in three months to monitor ADHD management.  ECZEMA: You are using Tacrolimus  cream for facial eczema, which has improved your skin condition but causes some itching. -Continue using Tacrolimus  cream twice daily. -Use Aveeno as a moisturizer for eczema.  GENERAL HEALTH MAINTENANCE: Your immunizations are up to date, and your last Pap smear was in 2022. We plan to check your health parameters due to past mild anemia and low vitamin D  levels. -Check blood count, kidney function, liver function, blood sugar, cholesterol, and vitamin D  levels. -Continue a healthy diet and gradually return to exercise.

## 2023-11-19 NOTE — Assessment & Plan Note (Signed)
  Immunizations up to date. Pap smear done in 2022, next due in 2027. Plans to check health parameters due to past mild anemia and low vitamin D  levels. - Check blood count, kidney function, liver function, blood sugar, cholesterol, and vitamin D  levels. - Encourage continued healthy diet and gradual return to exercise.

## 2023-11-19 NOTE — Assessment & Plan Note (Addendum)
 Stable on lexapro.  Continue same.

## 2023-11-19 NOTE — Progress Notes (Signed)
 Subjective:     Patient ID: Mikayla Hicks, female    DOB: June 04, 1986, 38 y.o.   MRN: [id number]  Chief Complaint  Patient presents with   Annual Exam    HPI  Discussed the use of AI scribe software for clinical note transcription with the patient, who gave verbal consent to proceed.  History of Present Illness  Mikayla Hicks is a 38 year old female who presents for an update on her physical exam and follow-up on her foot surgery.  She underwent corrective surgery for her pinky toe in March, which included osteotomy, pin insertion, and fascial shaving in her calf. Post-surgery, she experiences muscle spasms, particularly when lying down, and takes Robaxin  as needed for relief.  She has resumed walking and gym activities after being cleared post-surgery. Her diet consists of smaller, more frequent meals. She is on Wegovy  0.5 mg and has lost approximately 12 pounds, aided by Wegovy  and Vyvanse , with no side effects from the current dose.  Her skin condition has improved with Tacrolimus  cream applied twice daily, though it causes some itching. She uses Aveeno as a moisturizer for eczema. No cough, cold, leg swelling, digestive issues, urinary problems, or unusual muscle or joint pain. Headaches have improved with a new prescription. Her mood is stable on Lexapro  and Vyvanse .  Immunizations: tetanus 2022 Diet: fair Wt Readings from Last 3 Encounters:  11/19/23 253 lb (114.8 kg)  08/16/23 260 lb (117.9 kg)  07/12/23 260 lb (117.9 kg)   Exercise: Plans to start walking and returning to the gym following her hip surgery Pap Smear: 2022 hpv negative, due by 2027 Vision: up to date Dental: up to date     Health Maintenance Due  Topic Date Due   Hepatitis C Screening  Never done   COVID-19 Vaccine (2 - Pfizer risk series) 08/08/2020    Past Medical History:  Diagnosis Date   Allergy    Anxiety    just recently and seeing therapist.   Breech presentation 12/14/2020    Cesarean delivery - Breech, PPROM, [redacted]w[redacted]d labor 02/03/2021   Hemangioma vertebral column    Migraines    Preterm premature rupture of membranes (PPROM) 11/29/2020 11/29/2020    Past Surgical History:  Procedure Laterality Date   BACK SURGERY     CESAREAN SECTION N/A 02/03/2021   Procedure: Primary CESAREAN SECTION;  Surgeon: Terri Fester, MD;  Location: [facility] LD [facility];  Service: Obstetrics;  Laterality: N/A;  EDD: 03/25/21    Family History  Problem Relation Age of Onset   Healthy Mother    Healthy Father    Diabetes Mellitus II Maternal Grandmother    Heart failure Maternal Grandmother    Heart attack Maternal Grandfather    Alzheimer's disease Paternal Grandmother    Macular degeneration Paternal Grandfather    Breast cancer Cousin        breast cancer age 78, maternal    Social History   Socioeconomic History   Marital status: Divorced    Spouse name: Not on file   Number of children: Not on file   Years of education: Not on file   Highest education level: Bachelor's degree (e.g., BA, AB, BS)  Occupational History   Not on file  Tobacco Use   Smoking status: Never   Smokeless tobacco: Never  Vaping Use   Vaping status: Never Used  Substance and Sexual Activity   Alcohol use: Yes    Comment: rare and if does very little.  Drug use: No   Sexual activity: Not Currently    Birth control/protection: None  Other Topics Concern   Not on file  Social History Narrative   Single   Lives with her son   Works as a Buyer, retail   Enjoys spending time with her son, gym, reading   Social Drivers of Health   Financial Resource Strain: Medium Risk (08/15/2023)   Overall Financial Resource Strain (CARDIA)    Difficulty of Paying Living Expenses: Somewhat hard  Food Insecurity: Food Insecurity Present (08/15/2023)   Hunger Vital Sign    Worried About Running Out of Food in the Last Year: Sometimes true    Ran Out of Food in the Last Year: Never true  Transportation  Needs: No Transportation Needs (08/15/2023)   PRAPARE - Administrator, Civil Service (Medical): No    Lack of Transportation (Non-Medical): No  Physical Activity: Insufficiently Active (08/15/2023)   Exercise Vital Sign    Days of Exercise per Week: 3 days    Minutes of Exercise per Session: 30 min  Stress: No Stress Concern Present (08/15/2023)   [employer] of Occupational Health - Occupational Stress Questionnaire    Feeling of Stress : Only a little  Social Connections: Moderately Isolated (08/15/2023)   Social Connection and Isolation Panel [NHANES]    Frequency of Communication with Friends and Family: Three times a week    Frequency of Social Gatherings with Friends and Family: Once a week    Attends Religious Services: 1 to 4 times per year    Active Member of [employer] or Organizations: No    Attends Engineer, structural: Not on file    Marital Status: Divorced  Intimate Partner Violence: Not on file    Outpatient Medications Prior to Visit  Medication Sig Dispense Refill   betamethasone  dipropionate 0.05 % cream Apply topically 2 (two) times daily. 30 g 0   cholecalciferol (VITAMIN D3) 25 MCG (1000 UNIT) tablet Take 1,000 Units by mouth daily.     escitalopram  (LEXAPRO ) 10 MG tablet Take 1.5 tablets (15 mg total) by mouth daily. 135 tablet 1   lisdexamfetamine (VYVANSE ) 40 MG capsule Take 1 capsule (40 mg total) by mouth every morning. 30 capsule 0   methocarbamol  (ROBAXIN ) 750 MG tablet Take 1 tablet (750 mg total) by mouth 3 (three) times daily as needed (pain and muscle spasms) for up to 30 days. 45 tablet 0   tacrolimus  (PROTOPIC ) 0.03 % ointment Apply topically 2 (two) times daily. 100 g 1   Semaglutide -Weight Management (WEGOVY ) 0.5 MG/0.5ML SOAJ Inject 0.5 mg into the skin once a week. 2 mL 2   acetaminophen  (TYLENOL ) 500 MG tablet Take 2 tablets (1,000 mg total) by mouth every 8 (eight) hours. 180 tablet 0   aspirin  EC (ASPIRIN  81) 81 MG tablet Take 1  tablet (81 mg total) by mouth 2 (two) times daily for 30 days to prevent blood clots postoperatively. 60 tablet 0   methocarbamol  (ROBAXIN ) 750 MG tablet Take 1 tablet (750 mg total) by mouth 3 (three) times daily as needed for muscle spasms and pain, for up to 30 days. 45 tablet 0   naproxen  (NAPROSYN ) 500 MG tablet Take 1 tablet (500 mg total) by mouth 2 (two) times daily with a meal. 60 tablet 0   ondansetron  (ZOFRAN ) 4 MG tablet Take 1 tablet (4 mg total) by mouth every 8 (eight) hours as needed for nausea for up to 7 days. 20 tablet  0   oxyCODONE  (OXY IR/ROXICODONE ) 5 MG immediate release tablet Take 1-2 tablets (5-10 mg total) by mouth every 4 (four) hours as needed for pain for up to 7 days. 42 tablet 0   No facility-administered medications prior to visit.    No Known Allergies  Review of Systems  Constitutional:  Positive for weight loss.  HENT:  Negative for congestion.   Eyes:  Negative for blurred vision.  Respiratory:  Negative for cough.   Cardiovascular:  Negative for leg swelling.  Gastrointestinal:  Negative for constipation and diarrhea.  Genitourinary:  Negative for dysuria and frequency.  Musculoskeletal:  Negative for joint pain and myalgias.  Skin:  Positive for rash (eczema face- improving with tacrolimus ).  Neurological:  Positive for headaches (improved with new eye glass rx).  Psychiatric/Behavioral:         Mood stable on lexapro .        Objective:     Physical Exam   BP 123/82 (BP Location: Right Arm, Patient Position: Sitting, Cuff Size: Large)   Pulse 92   Temp 98.4 F (36.9 C) (Oral)   Resp 16   Ht 5\' 11"  (1.803 m)   Wt 253 lb (114.8 kg)   SpO2 97%   BMI 35.29 kg/m  Wt Readings from Last 3 Encounters:  11/19/23 253 lb (114.8 kg)  08/16/23 260 lb (117.9 kg)  07/12/23 260 lb (117.9 kg)  Physical Exam  Constitutional: She is oriented to person, place, and time. She appears well-developed and well-nourished. No distress.  HENT:  Head:  Normocephalic and atraumatic.  Right Ear: Tympanic membrane and ear canal normal.  Left Ear: Tympanic membrane and ear canal normal.  Mouth/Throat: Oropharynx is clear and moist.  Eyes: Pupils are equal, round, and reactive to light. No scleral icterus.  Neck: Normal range of motion. No thyromegaly present.  Cardiovascular: Normal rate and regular rhythm.   No murmur heard. Pulmonary/Chest: Effort normal and breath sounds normal. No respiratory distress. He has no wheezes. She has no rales. She exhibits no tenderness.  Abdominal: Soft. Bowel sounds are normal. She exhibits no distension and no mass. There is no tenderness. There is no rebound and no guarding.  Musculoskeletal: She exhibits no edema.  Lymphadenopathy:    She has no cervical adenopathy.  Neurological: She is alert and oriented to person, place, and time.  She exhibits normal muscle tone. Coordination normal.  Skin: Skin is warm and dry. Improvement in rash noted around mouth/cheek area Psychiatric: She has a normal mood and affect. Her behavior is normal. Judgment and thought content normal.  Breast/Pelvic: deferred         Assessment & Plan:        Assessment & Plan:   Problem List Items Addressed This Visit       Unprioritized   Preventative health care - Primary    Immunizations up to date. Pap smear done in 2022, next due in 2027. Plans to check health parameters due to past mild anemia and low vitamin D  levels. - Check blood count, kidney function, liver function, blood sugar, cholesterol, and vitamin D  levels. - Encourage continued healthy diet and gradual return to exercise.      Eczema   Improving with tacrolimus .      Depression   Stable on lexapro . Continue same.       Class 2 obesity due to excess calories without serious comorbidity with body mass index (BMI) of 36.0 to 36.9 in adult   Tolerating wegovy   0.5mg . Will increase to 1.0 mg weekly as tolerated. Noting weight loss and improved  hunger management. Wt Readings from Last 3 Encounters:  11/19/23 253 lb (114.8 kg)  08/16/23 260 lb (117.9 kg)  07/12/23 260 lb (117.9 kg)         Relevant Medications   Semaglutide -Weight Management (WEGOVY ) 1 MG/0.5ML SOAJ   Other Relevant Orders   Comp Met (CMET)   Lipid panel   Attention deficit hyperactivity disorder (ADHD)   Stable on vyvanse .        Anxiety   Stable on lexapro , continue same.      Anemia   Noted during pregnancy- update cbc today.       Other Visit Diagnoses       History of anemia       Relevant Orders   CBC w/Diff     Vitamin D  deficiency       Relevant Orders   VITAMIN D  25 Hydroxy (Vit-D Deficiency, Fractures)       I have discontinued Mikayla Hicks's Wegovy , acetaminophen , aspirin  EC, naproxen , ondansetron , and oxyCODONE . I am also having her start on Wegovy . Additionally, I am having her maintain her cholecalciferol, betamethasone  dipropionate, escitalopram , methocarbamol , tacrolimus , and lisdexamfetamine.  Meds ordered this encounter  Medications   Semaglutide -Weight Management (WEGOVY ) 1 MG/0.5ML SOAJ    Sig: Inject 1 mg into the skin once a week.    Dispense:  2 mL    Refill:  2    Supervising Provider:   Randie Bustle A [[id number]]

## 2023-11-19 NOTE — Assessment & Plan Note (Signed)
 Improving with tacrolimus .

## 2023-11-20 ENCOUNTER — Ambulatory Visit: Payer: Self-pay | Admitting: Family

## 2023-11-22 ENCOUNTER — Other Ambulatory Visit (HOSPITAL_COMMUNITY): Payer: Self-pay

## 2023-12-27 ENCOUNTER — Encounter (INDEPENDENT_AMBULATORY_CARE_PROVIDER_SITE_OTHER): Payer: Self-pay | Admitting: Family

## 2023-12-27 ENCOUNTER — Other Ambulatory Visit: Payer: Self-pay | Admitting: Family

## 2023-12-27 ENCOUNTER — Other Ambulatory Visit (HOSPITAL_COMMUNITY): Payer: Self-pay

## 2023-12-27 DIAGNOSIS — L309 Dermatitis, unspecified: Secondary | ICD-10-CM

## 2023-12-27 MED ORDER — PREDNISONE 10 MG PO TABS
ORAL_TABLET | ORAL | 0 refills | Status: AC
  Filled 2023-12-27: qty 20, 8d supply, fill #0

## 2023-12-27 MED ORDER — LISDEXAMFETAMINE DIMESYLATE 40 MG PO CAPS
40.0000 mg | ORAL_CAPSULE | ORAL | 0 refills | Status: DC
  Filled 2023-12-27: qty 30, 30d supply, fill #0

## 2023-12-27 NOTE — Telephone Encounter (Signed)

## 2023-12-27 NOTE — Telephone Encounter (Signed)
 Requesting: Vyvanse  40mg   Contract:09/05/23 UDS: 05/08/23 Last Visit: 11/19/23 Next Visit: 02/19/24 Last Refill: 10/31/23 #30 and 0RF   Please Advise

## 2024-01-23 ENCOUNTER — Other Ambulatory Visit (HOSPITAL_COMMUNITY): Payer: Self-pay

## 2024-01-24 ENCOUNTER — Other Ambulatory Visit (HOSPITAL_COMMUNITY): Payer: Self-pay

## 2024-01-24 ENCOUNTER — Telehealth: Payer: Self-pay

## 2024-01-24 NOTE — Telephone Encounter (Signed)
 Pharmacy Patient Advocate Encounter  Received notification from [facility] that Prior Authorization for Wegovy  1 has been DENIED.  Full denial letter will be uploaded to the media tab. See denial reason below. Last weight check was 11/19/23.     PA #/Case ID/Reference #: [id number]

## 2024-01-24 NOTE — Telephone Encounter (Signed)
 Pharmacy Patient Advocate Encounter   Received notification from CoverMyMeds that prior authorization for Wegovy  1 is required/requested.   Insurance verification completed.   The patient is insured through [facility] .   Per test claim: PA required; PA submitted to above mentioned insurance via Latent Key/confirmation #/EOC [facility] Status is pending

## 2024-01-28 ENCOUNTER — Other Ambulatory Visit (HOSPITAL_COMMUNITY): Payer: Self-pay

## 2024-02-03 ENCOUNTER — Other Ambulatory Visit (HOSPITAL_COMMUNITY): Payer: Self-pay

## 2024-02-03 ENCOUNTER — Encounter: Payer: Self-pay | Admitting: Family

## 2024-02-03 ENCOUNTER — Other Ambulatory Visit: Payer: Self-pay | Admitting: Family

## 2024-02-03 DIAGNOSIS — E66812 Obesity, class 2: Secondary | ICD-10-CM

## 2024-02-03 MED ORDER — LISDEXAMFETAMINE DIMESYLATE 40 MG PO CAPS
40.0000 mg | ORAL_CAPSULE | ORAL | 0 refills | Status: DC
  Filled 2024-02-03 – 2024-03-10 (×2): qty 30, 30d supply, fill #0

## 2024-02-12 ENCOUNTER — Other Ambulatory Visit (HOSPITAL_COMMUNITY): Payer: Self-pay

## 2024-02-14 ENCOUNTER — Ambulatory Visit: Admitting: Family

## 2024-02-14 ENCOUNTER — Other Ambulatory Visit (HOSPITAL_COMMUNITY): Payer: Self-pay

## 2024-02-14 ENCOUNTER — Encounter (HOSPITAL_COMMUNITY): Payer: Self-pay | Admitting: Pharmacist

## 2024-02-14 VITALS — BP 124/65 | HR 82 | Temp 98.5°F | Resp 16 | Ht 71.0 in | Wt 257.0 lb

## 2024-02-14 DIAGNOSIS — Z23 Encounter for immunization: Secondary | ICD-10-CM

## 2024-02-14 DIAGNOSIS — E66812 Obesity, class 2: Secondary | ICD-10-CM | POA: Diagnosis not present

## 2024-02-14 DIAGNOSIS — F32A Depression, unspecified: Secondary | ICD-10-CM

## 2024-02-14 DIAGNOSIS — R002 Palpitations: Secondary | ICD-10-CM | POA: Diagnosis not present

## 2024-02-14 DIAGNOSIS — L309 Dermatitis, unspecified: Secondary | ICD-10-CM | POA: Diagnosis not present

## 2024-02-14 DIAGNOSIS — F909 Attention-deficit hyperactivity disorder, unspecified type: Secondary | ICD-10-CM | POA: Diagnosis not present

## 2024-02-14 DIAGNOSIS — E6609 Other obesity due to excess calories: Secondary | ICD-10-CM

## 2024-02-14 DIAGNOSIS — Z6835 Body mass index (BMI) 35.0-35.9, adult: Secondary | ICD-10-CM

## 2024-02-14 DIAGNOSIS — Z6836 Body mass index (BMI) 36.0-36.9, adult: Secondary | ICD-10-CM

## 2024-02-14 LAB — BASIC METABOLIC PANEL WITH GFR
BUN: 12 mg/dL (ref 6–23)
CO2: 28 meq/L (ref 19–32)
Calcium: 9.3 mg/dL (ref 8.4–10.5)
Chloride: 101 meq/L (ref 96–112)
Creatinine, Ser: 0.78 mg/dL (ref 0.40–1.20)
GFR: 98.06 mL/min (ref >60.00)
Glucose, Bld: 78 mg/dL (ref 70–99)
Potassium: 4.5 meq/L (ref 3.5–5.1)
Sodium: 140 meq/L (ref 135–145)

## 2024-02-14 LAB — CBC WITH DIFFERENTIAL/PLATELET
Basophils Absolute: 0 K/uL (ref 0.0–0.1)
Basophils Relative: 0.4 % (ref 0.0–3.0)
Eosinophils Absolute: 0.1 K/uL (ref 0.0–0.7)
Eosinophils Relative: 2.4 % (ref 0.0–5.0)
HCT: 42.3 % (ref 36.0–46.0)
Hemoglobin: 13.8 g/dL (ref 12.0–15.0)
Lymphocytes Relative: 33.8 % (ref 12.0–46.0)
Lymphs Abs: 2.1 K/uL (ref 0.7–4.0)
MCHC: 32.6 g/dL (ref 30.0–36.0)
MCV: 82.4 fl (ref 78.0–100.0)
Monocytes Absolute: 0.6 K/uL (ref 0.1–1.0)
Monocytes Relative: 10.1 % (ref 3.0–12.0)
Neutro Abs: 3.2 K/uL (ref 1.4–7.7)
Neutrophils Relative %: 53.3 % (ref 43.0–77.0)
Platelets: 294 K/uL (ref 150.0–400.0)
RBC: 5.13 Mil/uL — ABNORMAL HIGH (ref 3.87–5.11)
RDW: 13.4 % (ref 11.5–15.5)
WBC: 6.1 K/uL (ref 4.0–10.5)

## 2024-02-14 LAB — TSH: TSH: 3.34 u[IU]/mL (ref 0.35–5.50)

## 2024-02-14 MED ORDER — ESCITALOPRAM OXALATE 10 MG PO TABS
15.0000 mg | ORAL_TABLET | Freq: Every day | ORAL | 1 refills | Status: AC
  Filled 2024-02-14: qty 135, 90d supply, fill #0
  Filled 2024-06-06: qty 135, 90d supply, fill #1

## 2024-02-14 MED ORDER — TACROLIMUS 0.1 % EX OINT
1.0000 | TOPICAL_OINTMENT | Freq: Two times a day (BID) | CUTANEOUS | 0 refills | Status: AC
  Filled 2024-02-14: qty 100, 30d supply, fill #0
  Filled 2024-02-24: qty 100, 50d supply, fill #0

## 2024-02-14 MED ORDER — TIRZEPATIDE-WEIGHT MANAGEMENT 2.5 MG/0.5ML ~~LOC~~ SOAJ
2.5000 mg | SUBCUTANEOUS | 2 refills | Status: DC
  Filled 2024-02-14: qty 2, 28d supply, fill #0

## 2024-02-14 NOTE — Progress Notes (Signed)
 Subjective:     Patient ID: Mikayla Hicks, female    DOB: 1986-02-17, 38 y.o.   MRN: [id number]  Chief Complaint  Patient presents with   ADHD    Here for follow up, last CSC 08/16/23, last UDS 05/08/23    HPI  Discussed the use of AI scribe software for clinical note transcription with the patient, who gave verbal consent to proceed.  History of Present Illness  Mikayla Hicks is a 38 year old female who presents with irregular heartbeats. She is accompanied by her child.  She experiences intermittent irregular heartbeats over the past few months. Last weekend, her heart rate increased to 112 bpm with several irregular beats in a row, observed on her watch. This episode led to her leaving work early. Symptoms resolved after a 30-45 minute nap but recurred the following day and sporadically throughout the week. She describes a slow heart rate followed by a forceful beat, causing mild shortness of breath.  Eczema continues to be present on her face, including under her eyes. She uses Protopic  and betamethasone , avoiding the latter around her eyes, with symptoms returning within a few days of stopping the creams.  She manages ADHD with Vyvanse , taken 5-6 days a week, and feels well when taken consistently. She continues with Lexapro , noticing a difference if a dose is missed. She is no longer taking wellbutrin .      Health Maintenance Due  Topic Date Due   Hepatitis C Screening  Never done   Pneumococcal Vaccine (1 of 2 - PCV) Never done   HPV VACCINES (1 - 3-dose SCDM series) Never done   COVID-19 Vaccine (2 - Pfizer risk series) 08/08/2020    Past Medical History:  Diagnosis Date   Allergy    Anxiety    just recently and seeing therapist.   Breech presentation 12/14/2020   Cesarean delivery - Breech, PPROM, 110w1d labor 02/03/2021   Hemangioma vertebral column    Migraines    Preterm premature rupture of membranes (PPROM) 11/29/2020 11/29/2020    Past Surgical  History:  Procedure Laterality Date   BACK SURGERY     CESAREAN SECTION N/A 02/03/2021   Procedure: Primary CESAREAN SECTION;  Surgeon: Barbette Knock, MD;  Location: [facility] LD ORS;  Service: Obstetrics;  Laterality: N/A;  EDD: 03/25/21    Family History  Problem Relation Age of Onset   Healthy Mother    Healthy Father    Diabetes Mellitus II Maternal Grandmother    Heart failure Maternal Grandmother    Heart attack Maternal Grandfather    Alzheimer's disease Paternal Grandmother    Macular degeneration Paternal Grandfather    Breast cancer Cousin        breast cancer age 59, maternal    Social History   Socioeconomic History   Marital status: Divorced    Spouse name: Not on file   Number of children: Not on file   Years of education: Not on file   Highest education level: Bachelor's degree (e.g., BA, AB, BS)  Occupational History   Not on file  Tobacco Use   Smoking status: Never   Smokeless tobacco: Never  Vaping Use   Vaping status: Never Used  Substance and Sexual Activity   Alcohol use: Yes    Comment: rare and if does very little.   Drug use: No   Sexual activity: Not Currently    Birth control/protection: None  Other Topics Concern   Not on file  Social History  Narrative   Single   Lives with her son   Works as a Buyer, retail   Enjoys spending time with her son, gym, reading   Social Drivers of Corporate investment banker Strain: Low Risk  (02/13/2024)   Overall Financial Resource Strain (CARDIA)    Difficulty of Paying Living Expenses: Not hard at all  Food Insecurity: No Food Insecurity (02/13/2024)   Hunger Vital Sign    Worried About Running Out of Food in the Last Year: Never true    Ran Out of Food in the Last Year: Never true  Transportation Needs: No Transportation Needs (02/13/2024)   PRAPARE - Administrator, Civil Service (Medical): No    Lack of Transportation (Non-Medical): No  Physical Activity: Insufficiently Active  (02/13/2024)   Exercise Vital Sign    Days of Exercise per Week: 3 days    Minutes of Exercise per Session: 30 min  Stress: No Stress Concern Present (02/13/2024)   [employer] of Occupational Health - Occupational Stress Questionnaire    Feeling of Stress: Not at all  Social Connections: Moderately Isolated (02/13/2024)   Social Connection and Isolation Panel    Frequency of Communication with Friends and Family: Three times a week    Frequency of Social Gatherings with Friends and Family: Once a week    Attends Religious Services: 1 to 4 times per year    Active Member of [employer] or Organizations: No    Attends Engineer, structural: Not on file    Marital Status: Divorced  Intimate Partner Violence: Not on file    Outpatient Medications Prior to Visit  Medication Sig Dispense Refill   betamethasone  dipropionate 0.05 % cream Apply topically 2 (two) times daily. 30 g 0   cholecalciferol (VITAMIN D3) 25 MCG (1000 UNIT) tablet Take 1,000 Units by mouth daily.     lisdexamfetamine (VYVANSE ) 40 MG capsule Take 1 capsule (40 mg total) by mouth every morning. 30 capsule 0   escitalopram  (LEXAPRO ) 10 MG tablet Take 1.5 tablets (15 mg total) by mouth daily. 135 tablet 1   methocarbamol  (ROBAXIN ) 750 MG tablet Take 1 tablet (750 mg total) by mouth 3 (three) times daily as needed (pain and muscle spasms) for up to 30 days. 45 tablet 0   semaglutide -weight management (WEGOVY ) 1 MG/0.5ML SOAJ SQ injection Inject 1 mg into the skin once a week. 2 mL 2   tacrolimus  (PROTOPIC ) 0.03 % ointment Apply topically 2 (two) times daily. 100 g 1   No facility-administered medications prior to visit.    No Known Allergies  ROS    See HPI  Objective:    Physical Exam Constitutional:      General: She is not in acute distress.    Appearance: Normal appearance. She is well-developed.  HENT:     Head: Normocephalic and atraumatic.     Right Ear: External ear normal.     Left Ear: External ear  normal.  Eyes:     General: No scleral icterus. Neck:     Thyroid : No thyromegaly.  Cardiovascular:     Rate and Rhythm: Normal rate and regular rhythm.     Heart sounds: Normal heart sounds. No murmur heard. Pulmonary:     Effort: Pulmonary effort is normal. No respiratory distress.     Breath sounds: Normal breath sounds. No wheezing.  Musculoskeletal:     Cervical back: Neck supple.  Skin:    General: Skin is warm and dry.  Comments: + skin rash noted around mouth and eyes  Neurological:     Mental Status: She is alert and oriented to person, place, and time.  Psychiatric:        Mood and Affect: Mood normal.        Behavior: Behavior normal.        Thought Content: Thought content normal.        Judgment: Judgment normal.      BP 124/65 (BP Location: Right Arm, Patient Position: Sitting, Cuff Size: Large)   Pulse 82   Temp 98.5 F (36.9 C) (Oral)   Resp 16   Ht 5' 11 (1.803 m)   Wt 257 lb (116.6 kg)   SpO2 100%   BMI 35.84 kg/m  Wt Readings from Last 3 Encounters:  02/14/24 257 lb (116.6 kg)  11/19/23 253 lb (114.8 kg)  08/16/23 260 lb (117.9 kg)       Assessment & Plan:   Problem List Items Addressed This Visit       Unprioritized   Palpitations   New. EKG tracing is personally reviewed.  EKG notes NSR.  No acute changes. Obtain labs as ordered. If recurrence of symptoms, advised pt to let me know and we can place an order for Zio Monitor.       Relevant Orders   Basic Metabolic Panel (BMET)   TSH   CBC w/Diff   EKG 12-Lead (Completed)   Eczema   Only slightly improved with protopic  0.03 %. Will increase to 0.1 % while we wait for dermatology consultation. She also has betamethasone  cream but knows not to use around her eyes.      Relevant Medications   tacrolimus  (PROTOPIC ) 0.1 % ointment   Depression   Stable on lexapro . Off of wellbutrin .  Continue same.       Relevant Medications   escitalopram  (LEXAPRO ) 10 MG tablet   Class 2  obesity due to excess calories with body mass index (BMI) of 35.0 to 35.9 in adult   Wegovy  was not approved due to needing updated BMI.  She prefers to switch to Zepbound , states it is covered by her insurance. Will send rx for 2.5mg  Zepbound .       Relevant Medications   tirzepatide  (ZEPBOUND ) 2.5 MG/0.5ML Pen   Attention deficit hyperactivity disorder (ADHD)   Continues to find improvement with Vyvanse . Controlled substance contract up to date.       Relevant Orders   DRUG MONITORING, PANEL 8 WITH CONFIRMATION, URINE   Other Visit Diagnoses       Needs flu shot    -  Primary   Relevant Orders   Flu vaccine trivalent PF, 6mos and older(Flulaval,Afluria,Fluarix,Fluzone) (Completed)       I have discontinued Britaney E. Geier's methocarbamol , tacrolimus , and Wegovy . I am also having her start on tacrolimus  and tirzepatide . Additionally, I am having her maintain her cholecalciferol, betamethasone  dipropionate, lisdexamfetamine, and escitalopram .  Meds ordered this encounter  Medications   tacrolimus  (PROTOPIC ) 0.1 % ointment    Sig: Apply 1 Application topically 2 (two) times daily.    Dispense:  100 g    Refill:  0    Supervising Provider:   DOMENICA BLACKBIRD A [[id number]]   tirzepatide  (ZEPBOUND ) 2.5 MG/0.5ML Pen    Sig: Inject 2.5 mg into the skin once a week.    Dispense:  2 mL    Refill:  2    Supervising Provider:   DOMENICA BLACKBIRD A [[id number]]   escitalopram  (  LEXAPRO ) 10 MG tablet    Sig: Take 1.5 tablets (15 mg total) by mouth daily.    Dispense:  135 tablet    Refill:  1    Supervising Provider:   DOMENICA BLACKBIRD A [[id number]]

## 2024-02-14 NOTE — Assessment & Plan Note (Signed)
 Continues to find improvement with Vyvanse . Controlled substance contract up to date.

## 2024-02-14 NOTE — Assessment & Plan Note (Signed)
 Stable on lexapro . Off of wellbutrin .  Continue same.

## 2024-02-14 NOTE — Assessment & Plan Note (Signed)
 New. EKG tracing is personally reviewed.  EKG notes NSR.  No acute changes. Obtain labs as ordered. If recurrence of symptoms, advised pt to let me know and we can place an order for Zio Monitor.

## 2024-02-14 NOTE — Assessment & Plan Note (Signed)
 Only slightly improved with protopic  0.03 %. Will increase to 0.1 % while we wait for dermatology consultation. She also has betamethasone  cream but knows not to use around her eyes.

## 2024-02-14 NOTE — Assessment & Plan Note (Signed)
 Wegovy  was not approved due to needing updated BMI.  She prefers to switch to Zepbound , states it is covered by her insurance. Will send rx for 2.5mg  Zepbound .

## 2024-02-14 NOTE — Patient Instructions (Signed)
 VISIT SUMMARY:  Today, we addressed your irregular heartbeats, eczema, and discussed your ADHD management. We also talked about a new treatment option for weight management.  YOUR PLAN:  PALPITATIONS: You have been experiencing irregular heartbeats with episodes of increased heart rate and mild shortness of breath. -We will perform an EKG today to check your heart's electrical activity. -We will order blood tests to check your electrolytes, thyroid  function, and blood count. -If the palpitations continue, we may consider using a heart monitor for a week to gather more information.  ECZEMA: Your eczema has spread to your face and recurs after stopping treatment. -Increase the strength of Protopic  to 0.1%. -Continue using betamethasone  but avoid applying it around your eyes.  OBESITY: We discussed your weight management and the interruption of Wegovy  treatment due to insurance issues. -We will start Zepbound  once it is approved by your insurance. -Follow up in three months to see how you are responding to the new treatment.  ATTENTION-DEFICIT HYPERACTIVITY DISORDER (ADHD): Your ADHD is well-managed with Vyvanse . -Continue taking Vyvanse  as you have been.

## 2024-02-17 ENCOUNTER — Other Ambulatory Visit (HOSPITAL_COMMUNITY): Payer: Self-pay

## 2024-02-17 ENCOUNTER — Encounter (HOSPITAL_COMMUNITY): Payer: Self-pay

## 2024-02-17 ENCOUNTER — Ambulatory Visit: Payer: Self-pay | Admitting: Family

## 2024-02-17 LAB — DM TEMPLATE

## 2024-02-17 LAB — DRUG MONITORING, PANEL 8 WITH CONFIRMATION, URINE
6 Acetylmorphine: NEGATIVE ng/mL (ref <10)
Alcohol Metabolites: NEGATIVE ng/mL (ref <500)
Amphetamines: NEGATIVE ng/mL (ref <500)
Benzodiazepines: NEGATIVE ng/mL (ref <100)
Buprenorphine, Urine: NEGATIVE ng/mL (ref <5)
Cocaine Metabolite: NEGATIVE ng/mL (ref <150)
Creatinine: 113.8 mg/dL (ref >=20.0)
MDMA: NEGATIVE ng/mL (ref <500)
Marijuana Metabolite: 210 ng/mL — ABNORMAL HIGH (ref <5)
Marijuana Metabolite: POSITIVE ng/mL — AB (ref <20)
Opiates: NEGATIVE ng/mL (ref <100)
Oxidant: NEGATIVE ug/mL (ref <200)
Oxycodone: NEGATIVE ng/mL (ref <100)
pH: 6.4 (ref 4.5–9.0)

## 2024-02-19 ENCOUNTER — Other Ambulatory Visit (HOSPITAL_COMMUNITY): Payer: Self-pay

## 2024-02-19 ENCOUNTER — Encounter: Payer: Self-pay | Admitting: Family

## 2024-02-19 ENCOUNTER — Ambulatory Visit: Admitting: Family

## 2024-02-20 ENCOUNTER — Encounter (HOSPITAL_COMMUNITY): Payer: Self-pay

## 2024-02-20 ENCOUNTER — Other Ambulatory Visit (HOSPITAL_COMMUNITY): Payer: Self-pay

## 2024-02-20 MED ORDER — WEGOVY 0.25 MG/0.5ML ~~LOC~~ SOAJ
0.2500 mg | SUBCUTANEOUS | 0 refills | Status: DC
  Filled 2024-02-20 – 2024-02-25 (×3): qty 2, 28d supply, fill #0

## 2024-02-24 ENCOUNTER — Other Ambulatory Visit (HOSPITAL_COMMUNITY): Payer: Self-pay

## 2024-02-25 ENCOUNTER — Other Ambulatory Visit (HOSPITAL_COMMUNITY): Payer: Self-pay

## 2024-03-10 ENCOUNTER — Other Ambulatory Visit (HOSPITAL_COMMUNITY): Payer: Self-pay

## 2024-03-27 ENCOUNTER — Encounter: Payer: Self-pay | Admitting: Dermatology

## 2024-04-06 ENCOUNTER — Encounter: Payer: Self-pay | Admitting: Family

## 2024-04-06 DIAGNOSIS — R002 Palpitations: Secondary | ICD-10-CM

## 2024-04-07 ENCOUNTER — Ambulatory Visit: Attending: Family

## 2024-04-07 DIAGNOSIS — R002 Palpitations: Secondary | ICD-10-CM

## 2024-04-07 NOTE — Progress Notes (Unsigned)
 EP to read.

## 2024-05-15 ENCOUNTER — Ambulatory Visit: Admitting: Family

## 2024-05-21 ENCOUNTER — Ambulatory Visit: Payer: Self-pay | Admitting: Family

## 2024-05-21 DIAGNOSIS — R002 Palpitations: Secondary | ICD-10-CM

## 2024-06-06 ENCOUNTER — Other Ambulatory Visit: Payer: Self-pay | Admitting: Family

## 2024-06-08 ENCOUNTER — Other Ambulatory Visit: Payer: Self-pay

## 2024-06-08 ENCOUNTER — Other Ambulatory Visit (HOSPITAL_COMMUNITY): Payer: Self-pay

## 2024-06-08 MED ORDER — LISDEXAMFETAMINE DIMESYLATE 40 MG PO CAPS
40.0000 mg | ORAL_CAPSULE | ORAL | 0 refills | Status: AC
  Filled 2024-06-08 – 2024-06-29 (×2): qty 30, 30d supply, fill #0

## 2024-06-18 ENCOUNTER — Other Ambulatory Visit (HOSPITAL_COMMUNITY): Payer: Self-pay

## 2024-06-23 ENCOUNTER — Encounter: Payer: Self-pay | Admitting: Family

## 2024-06-23 ENCOUNTER — Ambulatory Visit: Admitting: Family

## 2024-06-23 ENCOUNTER — Other Ambulatory Visit (HOSPITAL_COMMUNITY): Payer: Self-pay

## 2024-06-23 VITALS — BP 127/71 | HR 83 | Resp 16 | Ht 71.0 in | Wt 255.6 lb

## 2024-06-23 DIAGNOSIS — F909 Attention-deficit hyperactivity disorder, unspecified type: Secondary | ICD-10-CM | POA: Diagnosis not present

## 2024-06-23 DIAGNOSIS — G43109 Migraine with aura, not intractable, without status migrainosus: Secondary | ICD-10-CM | POA: Insufficient documentation

## 2024-06-23 DIAGNOSIS — F419 Anxiety disorder, unspecified: Secondary | ICD-10-CM

## 2024-06-23 MED ORDER — SUMATRIPTAN SUCCINATE 50 MG PO TABS
50.0000 mg | ORAL_TABLET | ORAL | 5 refills | Status: AC | PRN
  Filled 2024-06-23: qty 10, 10d supply, fill #0

## 2024-06-23 NOTE — Assessment & Plan Note (Signed)
 Stable on lexapro .

## 2024-06-23 NOTE — Assessment & Plan Note (Signed)
 Improved on increased dose vyvanse . Continue same.

## 2024-06-23 NOTE — Progress Notes (Unsigned)
 "  Subjective:     Patient ID: Mikayla Hicks, female    DOB: 1985/11/17, 39 y.o.   MRN: [id number]  Chief Complaint  Patient presents with   Follow-up    Patient is here for a follow up and medication management   Migraine    Patient has also been having an increase in migraines. Would like to discuss medications to help    HPI  Discussed the use of AI scribe software for clinical note transcription with the patient, who gave verbal consent to proceed.  History of Present Illness Mikayla Hicks is a 39 year old female who presents for a check-in regarding Vyvanse  and migraines.  She experiences migraines a couple of times a month, with pain localized above her left eye. Initially, she managed these headaches with Tylenol  and ibuprofen , but recently required a friend's migraine medication for relief. The headaches last up to two days and are sometimes accompanied by nausea, though not to the point of vomiting. No changes in vision such as wavy lines during these episodes. About a year ago, she noticed improvement in her headaches after changing her glasses prescription, but the migraines have persisted despite wearing her glasses consistently.  She is currently taking Vyvanse , which was recently increased, and she reports improved focus with this adjustment. She is also on Lexapro  and indicates she is doing well with it.  She mentions a rash that was previously treated with doxycycline, which cleared it up, but the rash has started to return. She has an upcoming dermatology appointment at the end of the month for further evaluation.      Health Maintenance Due  Topic Date Due   Hepatitis C Screening  Never done   Pneumococcal Vaccine (1 of 2 - PCV) Never done   HPV VACCINES (1 - Risk 3-dose SCDM series) Never done   COVID-19 Vaccine (2 - Pfizer risk series) 08/08/2020    Past Medical History:  Diagnosis Date   Allergy    Anxiety    just recently and seeing therapist.    Breech presentation 12/14/2020   Cesarean delivery - Breech, PPROM, [redacted]w[redacted]d labor 02/03/2021   Hemangioma vertebral column    Migraines    Preterm premature rupture of membranes (PPROM) 11/29/2020 11/29/2020    Past Surgical History:  Procedure Laterality Date   BACK SURGERY     CESAREAN SECTION N/A 02/03/2021   Procedure: Primary CESAREAN SECTION;  Surgeon: Barbette Knock, MD;  Location: [facility];  Service: Obstetrics;  Laterality: N/A;  EDD: 03/25/21    Family History  Problem Relation Age of Onset   Healthy Mother    Healthy Father    Diabetes Mellitus II Maternal Grandmother    Heart failure Maternal Grandmother    Heart attack Maternal Grandfather    Alzheimer's disease Paternal Grandmother    Macular degeneration Paternal Grandfather    Breast cancer Cousin        breast cancer age 29, maternal    Social History   Socioeconomic History   Marital status: Divorced    Spouse name: Not on file   Number of children: Not on file   Years of education: Not on file   Highest education level: Bachelor's degree (e.g., BA, AB, BS)  Occupational History   Not on file  Tobacco Use   Smoking status: Never   Smokeless tobacco: Never  Vaping Use   Vaping status: Never Used  Substance and Sexual Activity   Alcohol use: Yes  Comment: rare and if does very little.   Drug use: No   Sexual activity: Not Currently    Birth control/protection: None  Other Topics Concern   Not on file  Social History Narrative   Single   Lives with her son   Works as a buyer, retail   Enjoys spending time with her son, gym, reading   Social Drivers of Health   Tobacco Use: Low Risk (06/23/2024)   Patient History    Smoking Tobacco Use: Never    Smokeless Tobacco Use: Never    Passive Exposure: Not on file  Financial Resource Strain: Low Risk (02/13/2024)   Overall Financial Resource Strain (CARDIA)    Difficulty of Paying Living Expenses: Not hard at all  Food Insecurity: No Food  Insecurity (02/13/2024)   Epic    Worried About Radiation Protection Practitioner of Food in the Last Year: Never true    Ran Out of Food in the Last Year: Never true  Transportation Needs: No Transportation Needs (02/13/2024)   Epic    Lack of Transportation (Medical): No    Lack of Transportation (Non-Medical): No  Physical Activity: Insufficiently Active (02/13/2024)   Exercise Vital Sign    Days of Exercise per Week: 3 days    Minutes of Exercise per Session: 30 min  Stress: No Stress Concern Present (02/13/2024)   [employer] of Occupational Health - Occupational Stress Questionnaire    Feeling of Stress: Not at all  Social Connections: Moderately Isolated (02/13/2024)   Social Connection and Isolation Panel    Frequency of Communication with Friends and Family: Three times a week    Frequency of Social Gatherings with Friends and Family: Once a week    Attends Religious Services: 1 to 4 times per year    Active Member of [employer] or Organizations: No    Attends Engineer, Structural: Not on file    Marital Status: Divorced  Intimate Partner Violence: Not on file  Depression (PHQ2-9): Medium Risk (02/14/2024)   Depression (PHQ2-9)    PHQ-2 Score: 6  Alcohol Screen: Low Risk (02/13/2024)   Alcohol Screen    Last Alcohol Screening Score (AUDIT): 4  Housing: Low Risk (02/13/2024)   Epic    Unable to Pay for Housing in the Last Year: No    Number of Times Moved in the Last Year: 0    Homeless in the Last Year: No  Utilities: Not on file  Health Literacy: Not on file    Outpatient Medications Prior to Visit  Medication Sig Dispense Refill   cholecalciferol (VITAMIN D3) 25 MCG (1000 UNIT) tablet Take 1,000 Units by mouth daily.     escitalopram  (LEXAPRO ) 10 MG tablet Take 1 & 1/2 tablets (15 mg total) by mouth daily. 135 tablet 1   lisdexamfetamine  (VYVANSE ) 40 MG capsule Take 1 capsule (40 mg total) by mouth every morning. 30 capsule 0   tacrolimus  (PROTOPIC ) 0.1 % ointment Apply 1 Application  topically 2 (two) times daily. 100 g 0   betamethasone  dipropionate 0.05 % cream Apply topically 2 (two) times daily. 30 g 0   buPROPion  (WELLBUTRIN  XL) 300 MG 24 hr tablet Take 1 tablet (300 mg total) by mouth daily. (Patient not taking: Reported on 01/13/2020) 30 tablet 3   semaglutide -weight management (WEGOVY ) 0.25 MG/0.5ML SOAJ SQ injection Inject 0.25 mg into the skin once a week. 2 mL 0   No facility-administered medications prior to visit.    Allergies[1]  ROS  Objective:    Physical Exam   BP 127/71 (BP Location: Right Arm, Patient Position: Sitting, Cuff Size: Large)   Pulse 83   Resp 16   Ht 5' 11 (1.803 m)   Wt 255 lb 9.6 oz (115.9 kg)   SpO2 99%   BMI 35.65 kg/m  Wt Readings from Last 3 Encounters:  06/23/24 255 lb 9.6 oz (115.9 kg)  02/14/24 257 lb (116.6 kg)  11/19/23 253 lb (114.8 kg)       Assessment & Plan:   Problem List Items Addressed This Visit       Unprioritized   Migraine with aura and without status migrainosus, not intractable - Primary   Relevant Medications   SUMAtriptan  (IMITREX ) 50 MG tablet    I have discontinued Seleny E. Hanna's buPROPion  and Wegovy . I am also having her start on SUMAtriptan . Additionally, I am having her maintain her cholecalciferol, betamethasone  dipropionate, tacrolimus , escitalopram , and lisdexamfetamine .  Meds ordered this encounter  Medications   SUMAtriptan  (IMITREX ) 50 MG tablet    Sig: Take 1 tablet (50 mg total) by mouth every 2 (two) hours as needed. May repeat in 2 hours if headache persists or recurs.    Dispense:  10 tablet    Refill:  5    Supervising Provider:   DOMENICA BLACKBIRD A [[id number]]      [1] No Known Allergies  "

## 2024-06-25 NOTE — Assessment & Plan Note (Signed)
" °  Intermittent migraines above the left eye with nausea. No visual changes. Effective response to friend's medication. No link to increased Vyvanse  dosage. - Prescribed Imitrex  10 tablets/month for acute management. Take one at onset, repeat in two hours if needed, max two/day. - Sent prescription to [facility] pharmacy. - Advised to report if migraines persist despite Imitrex . "

## 2024-06-25 NOTE — Patient Instructions (Signed)
" °  VISIT SUMMARY: Today, we discussed your migraines and ADHD management. You mentioned experiencing migraines a couple of times a month, with pain above your left eye and occasional nausea. We also reviewed your current medications, including Vyvanse  and Lexapro , and noted your upcoming dermatology appointment for a recurring rash.  YOUR PLAN: -MIGRAINE WITH AURA: Migraines are severe headaches often accompanied by nausea and sensitivity to light or sound. You experience these headaches above your left eye with occasional nausea. We have prescribed Imitrex  for acute management. Take one tablet at the onset of a migraine, and you may take a second tablet two hours later if needed, but do not exceed two tablets in one day. Please report if your migraines persist despite using Imitrex .  -ATTENTION-DEFICIT HYPERACTIVITY DISORDER (ADHD): ADHD is a condition characterized by symptoms of inattention, hyperactivity, and impulsivity. Your symptoms are well-managed with your current Vyvanse  dosage, and you have reported improved focus with no adverse effects from the recent dosage increase. Continue with your current Vyvanse  dosage.  INSTRUCTIONS: Please follow up if your migraines persist despite using Imitrex . Continue with your current Vyvanse  dosage for ADHD management. Attend your upcoming dermatology appointment for further evaluation of your recurring rash.                                           "

## 2024-06-29 ENCOUNTER — Other Ambulatory Visit (HOSPITAL_COMMUNITY): Payer: Self-pay

## 2024-07-03 ENCOUNTER — Other Ambulatory Visit (HOSPITAL_COMMUNITY): Payer: Self-pay

## 2024-07-03 MED ORDER — ONDANSETRON 4 MG PO TBDP
4.0000 mg | ORAL_TABLET | Freq: Three times a day (TID) | ORAL | 2 refills | Status: AC | PRN
  Filled 2024-07-03: qty 30, 10d supply, fill #0

## 2024-07-06 ENCOUNTER — Ambulatory Visit: Admitting: Dermatology

## 2024-07-13 ENCOUNTER — Other Ambulatory Visit (HOSPITAL_COMMUNITY): Payer: Self-pay

## 2024-07-15 ENCOUNTER — Other Ambulatory Visit: Payer: Self-pay | Admitting: Family

## 2024-07-15 ENCOUNTER — Encounter: Payer: Self-pay | Admitting: Family

## 2024-07-15 ENCOUNTER — Other Ambulatory Visit (HOSPITAL_COMMUNITY): Payer: Self-pay

## 2024-07-15 MED ORDER — DOXYCYCLINE HYCLATE 100 MG PO TABS
100.0000 mg | ORAL_TABLET | Freq: Two times a day (BID) | ORAL | 0 refills | Status: AC
  Filled 2024-07-15: qty 20, 10d supply, fill #0

## 2024-07-16 ENCOUNTER — Other Ambulatory Visit: Payer: Self-pay | Admitting: Medical Genetics

## 2024-09-22 ENCOUNTER — Ambulatory Visit: Admitting: Family

## 2025-03-22 ENCOUNTER — Ambulatory Visit: Admitting: Physician Assistant
